# Patient Record
Sex: Female | Born: 1950 | Race: White | Hispanic: No | Marital: Single | Smoking: Former smoker
Health system: Southern US, Community
[De-identification: ages and names within clinical notes are randomized; demographics above are authoritative.]

## PROBLEM LIST (undated history)

## (undated) DIAGNOSIS — H269 Unspecified cataract: Secondary | ICD-10-CM

## (undated) DIAGNOSIS — I219 Acute myocardial infarction, unspecified: Secondary | ICD-10-CM

## (undated) DIAGNOSIS — I2510 Atherosclerotic heart disease of native coronary artery without angina pectoris: Secondary | ICD-10-CM

## (undated) DIAGNOSIS — G2581 Restless legs syndrome: Secondary | ICD-10-CM

## (undated) DIAGNOSIS — M1990 Unspecified osteoarthritis, unspecified site: Secondary | ICD-10-CM

## (undated) DIAGNOSIS — E039 Hypothyroidism, unspecified: Secondary | ICD-10-CM

## (undated) DIAGNOSIS — I10 Essential (primary) hypertension: Secondary | ICD-10-CM

## (undated) DIAGNOSIS — E079 Disorder of thyroid, unspecified: Secondary | ICD-10-CM

## (undated) DIAGNOSIS — E785 Hyperlipidemia, unspecified: Secondary | ICD-10-CM

## (undated) HISTORY — DX: Acute myocardial infarction, unspecified: I21.9

## (undated) HISTORY — DX: Atherosclerotic heart disease of native coronary artery without angina pectoris: I25.10

## (undated) HISTORY — DX: Unspecified cataract: H26.9

## (undated) HISTORY — DX: Essential (primary) hypertension: I10

## (undated) HISTORY — PX: POLYPECTOMY: SHX149

## (undated) HISTORY — DX: Hyperlipidemia, unspecified: E78.5

## (undated) HISTORY — PX: TONSILLECTOMY: SUR1361

## (undated) HISTORY — PX: CATARACT EXTRACTION: SUR2

## (undated) HISTORY — PX: LASIK: SHX215

## (undated) HISTORY — DX: Unspecified osteoarthritis, unspecified site: M19.90

## (undated) HISTORY — DX: Disorder of thyroid, unspecified: E07.9

## (undated) HISTORY — PX: COLONOSCOPY: SHX174

---

## 2006-10-09 HISTORY — PX: ABDOMINAL HYSTERECTOMY: SHX81

## 2012-10-09 HISTORY — PX: CORONARY STENT PLACEMENT: SHX1402

## 2013-06-01 DIAGNOSIS — I219 Acute myocardial infarction, unspecified: Secondary | ICD-10-CM

## 2013-06-01 HISTORY — DX: Acute myocardial infarction, unspecified: I21.9

## 2017-04-19 LAB — DERMATOPATHOLOGY REPORT

## 2018-12-30 ENCOUNTER — Ambulatory Visit: Payer: Self-pay | Admitting: Family Medicine

## 2019-02-10 ENCOUNTER — Encounter: Payer: Self-pay | Admitting: Family Medicine

## 2019-02-10 ENCOUNTER — Other Ambulatory Visit: Payer: Self-pay

## 2019-02-10 ENCOUNTER — Ambulatory Visit (INDEPENDENT_AMBULATORY_CARE_PROVIDER_SITE_OTHER): Payer: Medicare Other | Admitting: Family Medicine

## 2019-02-10 VITALS — BP 118/70 | HR 69 | Temp 98.5°F | Ht 62.5 in | Wt 113.6 lb

## 2019-02-10 DIAGNOSIS — I2510 Atherosclerotic heart disease of native coronary artery without angina pectoris: Secondary | ICD-10-CM | POA: Insufficient documentation

## 2019-02-10 DIAGNOSIS — E782 Mixed hyperlipidemia: Secondary | ICD-10-CM | POA: Insufficient documentation

## 2019-02-10 DIAGNOSIS — E039 Hypothyroidism, unspecified: Secondary | ICD-10-CM | POA: Diagnosis not present

## 2019-02-10 DIAGNOSIS — Z114 Encounter for screening for human immunodeficiency virus [HIV]: Secondary | ICD-10-CM

## 2019-02-10 DIAGNOSIS — Z1159 Encounter for screening for other viral diseases: Secondary | ICD-10-CM

## 2019-02-10 DIAGNOSIS — Z0000 Encounter for general adult medical examination without abnormal findings: Secondary | ICD-10-CM | POA: Diagnosis not present

## 2019-02-10 DIAGNOSIS — I10 Essential (primary) hypertension: Secondary | ICD-10-CM | POA: Diagnosis not present

## 2019-02-10 DIAGNOSIS — G8929 Other chronic pain: Secondary | ICD-10-CM

## 2019-02-10 DIAGNOSIS — M8580 Other specified disorders of bone density and structure, unspecified site: Secondary | ICD-10-CM | POA: Insufficient documentation

## 2019-02-10 DIAGNOSIS — Z0100 Encounter for examination of eyes and vision without abnormal findings: Secondary | ICD-10-CM

## 2019-02-10 DIAGNOSIS — G2581 Restless legs syndrome: Secondary | ICD-10-CM | POA: Insufficient documentation

## 2019-02-10 NOTE — Patient Instructions (Signed)
F/u in October... labs first (they are ordered, just make lab appointment first) then come in for appointment.   DEXA ordered. Handout given with number, but they should call you.   Will get records to see if you need any other preventative health.   So nice to meet you!!!

## 2019-02-10 NOTE — Progress Notes (Signed)
Phone: [phone]  Subjective:  Patient presents today for their Welcome to Medicare Exam and to establish care with chronic issues.   Hypertension: Here for follow up of hypertension.  Currently on cozaar 50mg  daily. Home readings range from 631 SHFWYOVZ/85 diastolic (with a wrist cuff in the AM). Takes medication as prescribed and denies any side effects. Exercise includes walking. Weight has been stable. Denies any chest pain, headaches, shortness of breath, vision changes, swelling in lower extremities.   Hypothyroid: currently on synthroid. She had her labs checked in 07/2018 and everything was normal. Currently asymptomatic.   CAD: stent placed in 2015. She is on her statin and was cleared by cardiologist 2 years ago. Takes baby asa daily.   Hyperlipidemia: currently on crestor. Takes daily. Hx of CAD with stent. Remote hx of smoking. No hx of diabetes. Takes medication as prescribed with no issues. Due for labs in October.   Mmg: 07/2018: normal Hysterectomy: no longer needs pap smears.  Cscope: 15 years ago..  Bone scan: due for this.  Pneumonia shot: unsure.   Preventive Screening-Counseling & Management  Vision screen: done today. R: 20/20. L: 20/100. Both: 20/20.   Visual Acuity Screening   Right eye Left eye Both eyes  Without correction: 20/20 20/100 20/20  With correction:       Advanced directives: yes   Smoking Status: Never Smoker Second Hand Smoking status: No smokers in home  Risk Factors Regular exercise: walks 5 miles per day  Diet: Vegetarian   Fall Risk: None  Fall Risk  02/10/2019  Falls in the past year? 0  Number falls in past yr: 0  Injury with Fall? 0   Opioid use history:  long term opioids use (Tramadol)  Cardiac risk factors:  advanced age (older than 30 for men, 48 for women)  Hyperlipidemia yes  No diabetes. no Family History: father,mother high cholesterol, MGM + diabetes   Depression Screen None. PHQ2 0 0   Depression screen PHQ  2/9 02/10/2019  Decreased Interest 0  Down, Depressed, Hopeless 0  PHQ - 2 Score 0  Altered sleeping 2  Tired, decreased energy 0  Change in appetite 0  Feeling bad or failure about yourself  0  Trouble concentrating 0  Moving slowly or fidgety/restless 0  Suicidal thoughts 0  PHQ-9 Score 2  Difficult doing work/chores Not difficult at all      Office Visit from 02/10/2019 in [facility]  PHQ-9 Total Score  2      Activities of Daily Living Independent ADLs and IADLs   Hearing Difficulties: -patient declines  Cognitive Testing No reported trouble.  none  Normal 3 word recall  Minicog: 5/5  List the Names of Other Physician/Practitioners you currently use: -no specialists at this time.    There is no immunization history on file for this patient. Required Immunizations needed today-will wait for records.   Screening tests- up to date Health Maintenance Due  Topic Date Due  . Hepatitis C Screening  1951/06/30  . HIV Screening  07/29/1968  . TETANUS/TDAP  07/29/1972  . MAMMOGRAM  07/30/2003  . COLONOSCOPY  07/30/2003  . DEXA SCAN  07/29/2018  . PNA vac Low Risk Adult (1 of 2 - PCV13) 07/29/2018   Review of Systems  Constitutional: Negative for chills, fever and malaise/fatigue.  HENT: Negative for hearing loss and sore throat.   Eyes: Negative for blurred vision and double vision.  Respiratory: Negative for cough, shortness of breath and wheezing.  Cardiovascular: Negative for chest pain, palpitations and leg swelling.  Gastrointestinal: Negative for abdominal pain, blood in stool, nausea and vomiting.  Genitourinary: Negative for dysuria and hematuria.  Musculoskeletal: Negative for falls.  Skin: Negative for rash.  Neurological: Negative for dizziness and weakness.  Psychiatric/Behavioral: Negative for memory loss and suicidal ideas. The patient is not nervous/anxious and does not have insomnia.      The following were  reviewed and entered/updated in epic: Past Medical History:  Diagnosis Date  . Hyperlipidemia   . Hypertension   . Thyroid disease    Patient Active Problem List   Diagnosis Date Noted  . CAD (coronary artery disease) 02/10/2019  . HTN (hypertension) 02/10/2019  . Hypothyroidism (acquired) 02/10/2019  . Hyperlipidemia, mixed 02/10/2019  . Osteopenia 02/10/2019  . RLS (restless legs syndrome) 02/10/2019  . Chronic pain 02/10/2019   Past Surgical History:  Procedure Laterality Date  . ABDOMINAL HYSTERECTOMY  2008  . CORONARY STENT PLACEMENT  2014    History reviewed. No pertinent family history.  Medications- reviewed and updated Current Outpatient Medications  Medication Sig Dispense Refill  . gabapentin (NEURONTIN) 300 MG capsule Take 300 mg by mouth 4 (four) times daily.    [name] levothyroxine (SYNTHROID) 88 MCG tablet Take 88 mcg by mouth daily before breakfast.    . losartan (COZAAR) 50 MG tablet Take 50 mg by mouth daily.    . rosuvastatin (CRESTOR) 40 MG tablet Take 40 mg by mouth daily.    . traMADol (ULTRAM) 50 MG tablet 100 mg 4 (four) times daily.      No current facility-administered medications for this visit.     Allergies-reviewed and updated Not on File  Social History   Socioeconomic History  . Marital status: Not on file    Spouse name: Not on file  . Number of children: Not on file  . Years of education: Not on file  . Highest education level: Not on file  Occupational History  . Not on file  Social Needs  . Financial resource strain: Not on file  . Food insecurity:    Worry: Not on file    Inability: Not on file  . Transportation needs:    Medical: Not on file    Non-medical: Not on file  Tobacco Use  . Smoking status: Former Research scientist (life sciences)  . Smokeless tobacco: Never Used  Substance and Sexual Activity  . Alcohol use: Not on file  . Drug use: Never  . Sexual activity: Not on file  Lifestyle  . Physical activity:    Days per week: Not on file     Minutes per session: Not on file  . Stress: Not on file  Relationships  . Social connections:    Talks on phone: Not on file    Gets together: Not on file    Attends religious service: Not on file    Active member of club or organization: Not on file    Attends meetings of clubs or organizations: Not on file    Relationship status: Not on file  Other Topics Concern  . Not on file  Social History Narrative  . Not on file    Objective: BP 118/70 (BP Location: Left Arm, Patient Position: Sitting)   Pulse 69   Temp 98.5 F (36.9 C) (Oral)   Ht 5' 2.5" (1.588 m)   Wt 113 lb 9.6 oz (51.5 kg)   LMP  (LMP Unknown)   SpO2 96%   BMI 20.45 kg/m  Gen: NAD, resting comfortably HEENT: Mucous membranes are moist. Oropharynx normal Neck: no thyromegaly CV: RRR no murmurs rubs or gallops Lungs: CTAB no crackles, wheeze, rhonchi Abdomen: soft/nontender/nondistended/normal bowel sounds. No rebound or guarding.  Ext: no edema Skin: warm, dry Neuro: grossly normal, moves all extremities, PERRLA  Assessment/Plan:  Welcome to Medicare exam completed- discussed recommended screenings anddocumented any personalized health advice and referrals for preventive counseling. See AVS as well which was given to patient. Depression screen done and her phq9 score is non significant at 2. No signs or symptoms of depression. Requesting records for all of her HM and other medical records.   Status of chronic or acute concerns  1. Essential hypertension Blood pressure is to goal. Continue current anti-hypertensive medications. Refills not needed. Routine lab work will be done today. Recommended routine exercise and healthy diet including DASH diet and mediterranean diet. Encouraged weight loss. F/u in 6 months.   - CBC with Differential/Platelet; Future - Comprehensive metabolic panel; Future - Microalbumin / creatinine urine ratio; Future  2. Hypothyroidism (acquired) - T4, free; Future - TSH;  Future  3. Coronary artery disease involving native coronary artery of native heart without angina pectoris -continue statin and baby aspirin. Already has healthy and active lifestyle. Continue current treatment plan.   4. Hyperlipidemia, mixed  - Lipid panel; Future  5. Osteopenia, unspecified location  - DG Bone Density; Future - VITAMIN D 25 Hydroxy (Vit-D Deficiency, Fractures); Future  6. RLS -will check ferritin level when she comes in. Does not need medication at this time.   7. Other chronic pain Drug contract signed today. Requesting records.   8. Encounter for screening for HIV  - HIV Antibody (routine testing w rflx); Future  9. Encounter for hepatitis C screening test for low risk patient  - Hepatitis C antibody; Future   No future appointments. Return in about 5 months (around 07/10/2019) for routine f/u with labs. .   Lab/Order associations: Encounter for screening for HIV - Plan: HIV Antibody (routine testing w rflx)  Essential hypertension - Plan: CBC with Differential/Platelet, Comprehensive metabolic panel, Microalbumin / creatinine urine ratio  Hypothyroidism (acquired) - Plan: T4, free, TSH  Coronary artery disease involving native coronary artery of native heart without angina pectoris  Hyperlipidemia, mixed - Plan: Lipid panel  Osteopenia, unspecified location - Plan: DG Bone Density, VITAMIN D 25 Hydroxy (Vit-D Deficiency, Fractures)  Other chronic pain  Encounter for hepatitis C screening test for low risk patient - Plan: Hepatitis C antibody  Encounter for vision screening  RLS (restless legs syndrome)  No orders of the defined types were placed in this encounter.   Return precautions advised. Orma Flaming, MD

## 2019-02-17 ENCOUNTER — Other Ambulatory Visit: Payer: Self-pay | Admitting: Family Medicine

## 2019-02-17 DIAGNOSIS — M8580 Other specified disorders of bone density and structure, unspecified site: Secondary | ICD-10-CM

## 2019-03-09 ENCOUNTER — Encounter: Payer: Self-pay | Admitting: Family Medicine

## 2019-03-10 ENCOUNTER — Other Ambulatory Visit: Payer: Self-pay

## 2019-03-10 MED ORDER — GABAPENTIN 300 MG PO CAPS: 300.0000 mg | ORAL_CAPSULE | Freq: Four times a day (QID) | ORAL | 3 refills | Status: DC

## 2019-03-26 DIAGNOSIS — M79671 Pain in right foot: Secondary | ICD-10-CM | POA: Diagnosis not present

## 2019-03-26 DIAGNOSIS — M79672 Pain in left foot: Secondary | ICD-10-CM | POA: Diagnosis not present

## 2019-04-02 ENCOUNTER — Encounter: Payer: Self-pay | Admitting: Family Medicine

## 2019-04-02 ENCOUNTER — Other Ambulatory Visit: Payer: Self-pay

## 2019-04-02 MED ORDER — TRAMADOL HCL 50 MG PO TABS: 100.0000 mg | ORAL_TABLET | Freq: Four times a day (QID) | ORAL | 0 refills | Status: DC

## 2019-04-02 MED ORDER — ROSUVASTATIN CALCIUM 40 MG PO TABS: 40.0000 mg | ORAL_TABLET | Freq: Every day | ORAL | 3 refills | Status: DC

## 2019-04-02 MED ORDER — LOSARTAN POTASSIUM 50 MG PO TABS: 50.0000 mg | ORAL_TABLET | Freq: Every day | ORAL | 3 refills | Status: DC

## 2019-04-02 MED ORDER — LEVOTHYROXINE SODIUM 88 MCG PO TABS: 88.0000 ug | ORAL_TABLET | Freq: Every day | ORAL | 3 refills | Status: DC

## 2019-04-02 NOTE — Progress Notes (Signed)
Refills for:  levothyroxine (SYNTHROID) 88 MCG tablet 88 mcg, Daily before breakfast            losartan (COZAAR) 50 MG tablet 50 mg, Daily           rosuvastatin (CRESTOR) 40 MG tablet 40 mg, Daily      Sent to [facility].  Tramadol refill request sent to Dr. Jonni Sanger for approval.

## 2019-04-21 DIAGNOSIS — Z1159 Encounter for screening for other viral diseases: Secondary | ICD-10-CM | POA: Diagnosis not present

## 2019-04-21 DIAGNOSIS — Z01812 Encounter for preprocedural laboratory examination: Secondary | ICD-10-CM | POA: Diagnosis not present

## 2019-04-22 ENCOUNTER — Encounter: Payer: Self-pay | Admitting: Family Medicine

## 2019-04-22 NOTE — Telephone Encounter (Signed)
Copied from [place] [id number]. Topic: General - Other >> Apr 22, 2019  2:48 PM [name] wrote: Reason for CRM: Pt stated she has an appt for surgery on 04/25/19 and the surgeons office (Dr. Jetta Lout) has been trying to fax the medical clearance form to the office but they have been unsuccessful. Pt asked that Dr. Kateri Plummer office be contacted regarding the medical clearance form. Fax# [phone] attnLangley Gauss ph# [phone]

## 2019-04-25 DIAGNOSIS — E7800 Pure hypercholesterolemia, unspecified: Secondary | ICD-10-CM | POA: Diagnosis not present

## 2019-04-25 DIAGNOSIS — Z87891 Personal history of nicotine dependence: Secondary | ICD-10-CM | POA: Diagnosis not present

## 2019-04-25 DIAGNOSIS — Z8261 Family history of arthritis: Secondary | ICD-10-CM | POA: Diagnosis not present

## 2019-04-25 DIAGNOSIS — I119 Hypertensive heart disease without heart failure: Secondary | ICD-10-CM | POA: Diagnosis not present

## 2019-04-25 DIAGNOSIS — M24477 Recurrent dislocation, right toe(s): Secondary | ICD-10-CM | POA: Diagnosis not present

## 2019-04-25 DIAGNOSIS — M810 Age-related osteoporosis without current pathological fracture: Secondary | ICD-10-CM | POA: Diagnosis not present

## 2019-04-25 DIAGNOSIS — Z79899 Other long term (current) drug therapy: Secondary | ICD-10-CM | POA: Diagnosis not present

## 2019-04-25 DIAGNOSIS — E079 Disorder of thyroid, unspecified: Secondary | ICD-10-CM | POA: Diagnosis not present

## 2019-04-25 DIAGNOSIS — M24375 Pathological dislocation of left foot, not elsewhere classified: Secondary | ICD-10-CM | POA: Diagnosis not present

## 2019-04-25 DIAGNOSIS — S93104A Unspecified dislocation of right toe(s), initial encounter: Secondary | ICD-10-CM | POA: Diagnosis not present

## 2019-04-25 DIAGNOSIS — M24374 Pathological dislocation of right foot, not elsewhere classified: Secondary | ICD-10-CM | POA: Diagnosis not present

## 2019-05-01 ENCOUNTER — Other Ambulatory Visit: Payer: Self-pay | Admitting: Family Medicine

## 2019-05-01 MED ORDER — TRAMADOL HCL 50 MG PO TABS: 100.0000 mg | ORAL_TABLET | Freq: Four times a day (QID) | ORAL | 0 refills | Status: DC

## 2019-05-01 NOTE — Telephone Encounter (Signed)
Last OV 02/10/19 Last refill 04/02/19 #240/0 Next OV not scheudled

## 2019-05-02 DIAGNOSIS — M79672 Pain in left foot: Secondary | ICD-10-CM | POA: Diagnosis not present

## 2019-05-02 DIAGNOSIS — M79671 Pain in right foot: Secondary | ICD-10-CM | POA: Diagnosis not present

## 2019-05-14 DIAGNOSIS — M79671 Pain in right foot: Secondary | ICD-10-CM | POA: Diagnosis not present

## 2019-05-15 ENCOUNTER — Other Ambulatory Visit: Payer: Medicare Other

## 2019-05-28 DIAGNOSIS — M79671 Pain in right foot: Secondary | ICD-10-CM | POA: Diagnosis not present

## 2019-06-02 ENCOUNTER — Other Ambulatory Visit: Payer: Self-pay | Admitting: Family Medicine

## 2019-06-02 ENCOUNTER — Encounter: Payer: Self-pay | Admitting: Family Medicine

## 2019-06-04 ENCOUNTER — Other Ambulatory Visit: Payer: Self-pay | Admitting: Family Medicine

## 2019-06-04 MED ORDER — TRAMADOL HCL 50 MG PO TABS: 100.0000 mg | ORAL_TABLET | Freq: Four times a day (QID) | ORAL | 0 refills | Status: DC

## 2019-06-04 NOTE — Progress Notes (Signed)
pmp reveiwed. Oxycodone for toe surgery as well as norco. Taking correctly. pmp verified.  Orma Flaming, MD [place]

## 2019-06-05 ENCOUNTER — Encounter: Payer: Self-pay | Admitting: Family Medicine

## 2019-06-23 ENCOUNTER — Ambulatory Visit (INDEPENDENT_AMBULATORY_CARE_PROVIDER_SITE_OTHER): Payer: Medicare Other | Admitting: Family Medicine

## 2019-06-23 ENCOUNTER — Encounter: Payer: Self-pay | Admitting: Family Medicine

## 2019-06-23 ENCOUNTER — Other Ambulatory Visit: Payer: Self-pay

## 2019-06-23 VITALS — BP 118/72 | HR 80 | Ht 62.4 in | Wt 112.0 lb

## 2019-06-23 DIAGNOSIS — G8929 Other chronic pain: Secondary | ICD-10-CM | POA: Diagnosis not present

## 2019-06-23 DIAGNOSIS — M25541 Pain in joints of right hand: Secondary | ICD-10-CM

## 2019-06-23 DIAGNOSIS — E039 Hypothyroidism, unspecified: Secondary | ICD-10-CM

## 2019-06-23 DIAGNOSIS — E782 Mixed hyperlipidemia: Secondary | ICD-10-CM

## 2019-06-23 DIAGNOSIS — I2510 Atherosclerotic heart disease of native coronary artery without angina pectoris: Secondary | ICD-10-CM

## 2019-06-23 DIAGNOSIS — Z0000 Encounter for general adult medical examination without abnormal findings: Secondary | ICD-10-CM

## 2019-06-23 DIAGNOSIS — I10 Essential (primary) hypertension: Secondary | ICD-10-CM

## 2019-06-23 DIAGNOSIS — M25542 Pain in joints of left hand: Secondary | ICD-10-CM

## 2019-06-23 MED ORDER — PNEUMOCOCCAL VAC POLYVALENT 25 MCG/0.5ML IJ INJ: 0.5000 mL | INJECTION | INTRAMUSCULAR | 0 refills | Status: AC

## 2019-06-23 NOTE — Patient Instructions (Signed)
I would like to see you back at the end of October after you have done your blood work so we can discuss everything in person.  On your way out please make an appointment for both the lab work and your next appointment with me.    Have a great day,   Clemetine Marker, MD

## 2019-06-23 NOTE — Progress Notes (Signed)
[facility] Phone: [phone]     Crystal Barnes - 68 y.o. female MRN [id number]  Date of birth: Nov 13, 1950  Subjective:   cc: establish care  HPI:  Patient moved to [place] within the past year from [place], [place]. She originally established with [facility] but had to re-establish with [facility] d/t insurance reasons after she moved from her apartment to her home.    Aching hands: patient has had aching feeling in the joints of her hands since doing some work fixing up her new house a few months ago..  It is worse in the morning and resolves by mid-day.  She admits she was doing more work around the house recently which could be causing her hands to be sore.      [id number] years ago she had two episodes of ACS which caused her to go to the ED where her BP was  200/100.  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 She has maintained a Vegan since that time. She takes crestor and has no side effects such as RUQ or muscle pain.   Chronic pain in right pelvis - this has been a chronic issue for her and she has tried several treatment modalities including nerve blocks, elavil.  She is currently on gabapentin 4x daily and tramadol 4x daily.  She says this controls her pain very well but she notices the pain immediately if she does not take her medication for a day.     Hypothyroidism - the patient is currently on 73mcg daily of synthroid.  Tolerating it well.  Denies weight gain/loss, palpitations.   The patient would like to have all her lab work done in October.  She also wants to get a colonoscopy and mammogram during October too.    Social: No children, never married, drinks alcohol rarely. Quit smoking in  1994, smoked for 30 years before that. Retired now. 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 For fun she will  Watch tv and play with labradoodle..she also Visits with sister, who lives nearby. .     ROS:  See HPI for pertinent positives and negatives  Family history reviewed for today's visit. No changes.  Social history- patient is a former smoker   Objective:   BP 118/72   Pulse 80   Ht 5' 2.4" (1.585 m)   Wt 112 lb (50.8 kg)   LMP  (LMP Unknown)   SpO2 96%   BMI 20.22 kg/m  Gen: NAD, alert and oriented, cooperative with exam HEENT: NCAT, EOMI, MMM Neck: FROM, supple, no masses CV: normal rate, regular rhythm. No murmurs, no rubs.  Resp: LCTAB, no wheezes, crackles. normal work of breathing GI: nontender to palpation, BS present, no guarding or organomegaly Msk: No edema, warm, normal tone, moves UE/LE spontaneously. No TTP or swelling of joints of hands/wrists bilaterally. Neuro: CN II-XII grossly intact. no gross deficits Skin: No rashes, no lesions Psych: Appropriate behavior  Assessment/Plan:   Arthralgia of both hands Started noticing it after she did some extensive housework when moving into her new house.  Given that it is worse in the morning and improves throughout the day, it raises the possibility of rheumatoid arthritis. Patient does have hypothyroidism, and it is uncertain if that was caused by autoimmune disorder.  However, given her age and lack of other symptoms and normal appearance on exam, it is unlikely this is RA.  Given she noticed it after a period of extensive use, more likely it is the beginning of arthritis, but lacks physical changes to the joints at this time.    Chronic pain Well controlled on tramadol and gabapentin. Does not need refill at this time.   Health maintenance examination - colonoscopy referral - mammogram referral - labs in October: hiv, hep c, bmp, cbc  Hyperlipidemia, mixed On crestor.  No side effects - lipid panel in october  Hypothyroidism (acquired) Currently taking synthroid 60mcg - tsh in october  CAD (coronary artery disease) No chest pain currently. Had stent placed in 2015.  On ASA 81. On crestor and losartan.    HTN (hypertension) On losartan 50mg  daily.  Well controlled.  Normotensive today.  - continue losartan  Clemetine Marker, MD PGY-2 [facility]

## 2019-06-24 ENCOUNTER — Telehealth: Payer: Self-pay | Admitting: *Deleted

## 2019-06-24 NOTE — Telephone Encounter (Signed)
Recived fax from [facility].  Pneumo 23 cant be sent thru home delivery pharmacy, will need to be sent to local pharmacy or given here in office. Christen Bame, CMA

## 2019-06-25 ENCOUNTER — Other Ambulatory Visit: Payer: Self-pay | Admitting: Family Medicine

## 2019-06-25 ENCOUNTER — Encounter: Payer: Self-pay | Admitting: Gastroenterology

## 2019-06-25 MED ORDER — PNEUMOVAX 23 25 MCG/0.5ML IJ INJ: 0.5000 mL | INJECTION | INTRAMUSCULAR | 0 refills | Status: AC

## 2019-06-25 NOTE — Telephone Encounter (Signed)
We are out of stock at the moment.  To white team to call when new shipment arrives.  Christen Bame, CMA

## 2019-06-25 NOTE — Telephone Encounter (Signed)
I called the patient to follow up that we heard back from our [facility] rep who stated that Dr. Rogers Blocker is credentialed with [facility] Medicare, effective 02/03/18.  The patient wanted to have Dr. Rogers Blocker as PCP, she had just saw Dr. Jeannine Kitten for a meet and greet on 06/23/19 (not [place]) thinking she had to change PCPs due to credentialing and said that she would contact their office to advise that she is back with Dr. Rogers Blocker. No approval was needed as this was a credentialing issue and not [place] to [employer].  Patient said that she is happy to get this update and is excited to be back with Dr. Rogers Blocker.

## 2019-06-25 NOTE — Telephone Encounter (Signed)
We have it here right?  I don't think I realized that at the time.  She can just come here to get it if we have it.  i'll put it in as a future order and let her know.

## 2019-06-26 DIAGNOSIS — M25542 Pain in joints of left hand: Secondary | ICD-10-CM | POA: Insufficient documentation

## 2019-06-26 DIAGNOSIS — M25541 Pain in joints of right hand: Secondary | ICD-10-CM | POA: Insufficient documentation

## 2019-06-26 DIAGNOSIS — Z0000 Encounter for general adult medical examination without abnormal findings: Secondary | ICD-10-CM | POA: Insufficient documentation

## 2019-06-26 NOTE — Assessment & Plan Note (Signed)
On crestor.  No side effects - lipid panel in october

## 2019-06-26 NOTE — Assessment & Plan Note (Signed)
Currently taking synthroid 47mcg - tsh in october

## 2019-06-26 NOTE — Assessment & Plan Note (Signed)
-   colonoscopy referral - mammogram referral - labs in October: hiv, hep c, bmp, cbc

## 2019-06-26 NOTE — Assessment & Plan Note (Signed)
Started noticing it after she did some extensive housework when moving into her new house.  Given that it is worse in the morning and improves throughout the day, it raises the possibility of rheumatoid arthritis. Patient does have hypothyroidism, and it is uncertain if that was caused by autoimmune disorder.  However, given her age and lack of other symptoms and normal appearance on exam, it is unlikely this is RA.  Given she noticed it after a period of extensive use, more likely it is the beginning of arthritis, but lacks physical changes to the joints at this time.

## 2019-06-26 NOTE — Assessment & Plan Note (Signed)
No chest pain currently. Had stent placed in 2015.  On ASA 81. On crestor and losartan.

## 2019-06-26 NOTE — Assessment & Plan Note (Signed)
Well controlled on tramadol and gabapentin. Does not need refill at this time.

## 2019-06-26 NOTE — Assessment & Plan Note (Signed)
On losartan 50mg  daily.  Well controlled.  Normotensive today.  - continue losartan

## 2019-06-27 ENCOUNTER — Encounter: Payer: Self-pay | Admitting: Family Medicine

## 2019-06-30 ENCOUNTER — Other Ambulatory Visit: Payer: Self-pay | Admitting: Family Medicine

## 2019-07-03 ENCOUNTER — Encounter: Payer: Self-pay | Admitting: Family Medicine

## 2019-07-07 ENCOUNTER — Encounter: Payer: Self-pay | Admitting: Gastroenterology

## 2019-07-07 ENCOUNTER — Ambulatory Visit (AMBULATORY_SURGERY_CENTER): Payer: Self-pay

## 2019-07-07 ENCOUNTER — Other Ambulatory Visit: Payer: Self-pay

## 2019-07-07 VITALS — Temp 96.6°F | Ht 62.5 in | Wt 112.6 lb

## 2019-07-07 DIAGNOSIS — Z1211 Encounter for screening for malignant neoplasm of colon: Secondary | ICD-10-CM

## 2019-07-07 MED ORDER — PEG 3350-KCL-NA BICARB-NACL 420 G PO SOLR: 4000.0000 mL | Freq: Once | ORAL | 0 refills | Status: AC

## 2019-07-07 NOTE — Progress Notes (Signed)
Denies allergies to eggs or soy products. Denies complication of anesthesia or sedation. Denies use of weight loss medication. Denies use of O2.   Emmi instructions given for colonoscopy.  

## 2019-07-14 ENCOUNTER — Other Ambulatory Visit: Payer: Self-pay

## 2019-07-14 ENCOUNTER — Other Ambulatory Visit: Payer: Medicare Other

## 2019-07-14 ENCOUNTER — Other Ambulatory Visit (INDEPENDENT_AMBULATORY_CARE_PROVIDER_SITE_OTHER): Payer: Medicare Other

## 2019-07-14 DIAGNOSIS — E782 Mixed hyperlipidemia: Secondary | ICD-10-CM

## 2019-07-14 DIAGNOSIS — M8580 Other specified disorders of bone density and structure, unspecified site: Secondary | ICD-10-CM

## 2019-07-14 DIAGNOSIS — G2581 Restless legs syndrome: Secondary | ICD-10-CM

## 2019-07-14 DIAGNOSIS — I10 Essential (primary) hypertension: Secondary | ICD-10-CM

## 2019-07-14 DIAGNOSIS — E039 Hypothyroidism, unspecified: Secondary | ICD-10-CM | POA: Diagnosis not present

## 2019-07-14 DIAGNOSIS — Z1159 Encounter for screening for other viral diseases: Secondary | ICD-10-CM

## 2019-07-14 DIAGNOSIS — Z114 Encounter for screening for human immunodeficiency virus [HIV]: Secondary | ICD-10-CM

## 2019-07-14 LAB — FERRITIN: Ferritin: 92 ng/mL (ref 10.0–291.0)

## 2019-07-14 LAB — COMPREHENSIVE METABOLIC PANEL
ALT: 42 U/L — ABNORMAL HIGH (ref 0–35)
AST: 37 U/L (ref 0–37)
Albumin: 4.3 g/dL (ref 3.5–5.2)
Alkaline Phosphatase: 69 U/L (ref 39–117)
BUN: 17 mg/dL (ref 6–23)
CO2: 28 mEq/L (ref 19–32)
Calcium: 9.8 mg/dL (ref 8.4–10.5)
Chloride: 104 mEq/L (ref 96–112)
Creatinine, Ser: 0.62 mg/dL (ref 0.40–1.20)
GFR: 96.32 mL/min (ref >60.00)
Glucose, Bld: 91 mg/dL (ref 70–99)
Potassium: 5.2 mEq/L — ABNORMAL HIGH (ref 3.5–5.1)
Sodium: 137 mEq/L (ref 135–145)
Total Bilirubin: 0.4 mg/dL (ref 0.2–1.2)
Total Protein: 6.3 g/dL (ref 6.0–8.3)

## 2019-07-14 LAB — CBC WITH DIFFERENTIAL/PLATELET
Basophils Absolute: 0.1 10*3/uL (ref 0.0–0.1)
Basophils Relative: 1.3 % (ref 0.0–3.0)
Eosinophils Absolute: 0.4 10*3/uL (ref 0.0–0.7)
Eosinophils Relative: 7.4 % — ABNORMAL HIGH (ref 0.0–5.0)
HCT: 40.7 % (ref 36.0–46.0)
Hemoglobin: 13.5 g/dL (ref 12.0–15.0)
Lymphocytes Relative: 37.1 % (ref 12.0–46.0)
Lymphs Abs: 2.1 10*3/uL (ref 0.7–4.0)
MCHC: 33.3 g/dL (ref 30.0–36.0)
MCV: 94.5 fl (ref 78.0–100.0)
Monocytes Absolute: 0.5 10*3/uL (ref 0.1–1.0)
Monocytes Relative: 8.7 % (ref 3.0–12.0)
Neutro Abs: 2.6 10*3/uL (ref 1.4–7.7)
Neutrophils Relative %: 45.5 % (ref 43.0–77.0)
Platelets: 288 10*3/uL (ref 150.0–400.0)
RBC: 4.31 Mil/uL (ref 3.87–5.11)
RDW: 13.4 % (ref 11.5–15.5)
WBC: 5.6 10*3/uL (ref 4.0–10.5)

## 2019-07-14 LAB — TSH: TSH: 5.17 u[IU]/mL — ABNORMAL HIGH (ref 0.35–4.50)

## 2019-07-14 LAB — LIPID PANEL
Cholesterol: 159 mg/dL (ref 0–200)
HDL: 52.3 mg/dL (ref >39.00)
LDL Cholesterol: 86 mg/dL (ref 0–99)
NonHDL: 106.8
Total CHOL/HDL Ratio: 3
Triglycerides: 103 mg/dL (ref 0.0–149.0)
VLDL: 20.6 mg/dL (ref 0.0–40.0)

## 2019-07-14 LAB — MICROALBUMIN / CREATININE URINE RATIO
Creatinine,U: 12.1 mg/dL
Microalb Creat Ratio: 5.8 mg/g (ref 0.0–30.0)
Microalb, Ur: <0.7 mg/dL (ref 0.0–1.9)

## 2019-07-14 LAB — VITAMIN D 25 HYDROXY (VIT D DEFICIENCY, FRACTURES): VITD: 59.94 ng/mL (ref 30.00–100.00)

## 2019-07-14 LAB — T4, FREE: Free T4: 0.69 ng/dL (ref 0.60–1.60)

## 2019-07-15 ENCOUNTER — Encounter: Payer: Self-pay | Admitting: Family Medicine

## 2019-07-15 LAB — HIV ANTIBODY (ROUTINE TESTING W REFLEX): HIV 1&2 Ab, 4th Generation: NONREACTIVE

## 2019-07-15 LAB — HEPATITIS C ANTIBODY
Hepatitis C Ab: NONREACTIVE
SIGNAL TO CUT-OFF: 0.18 (ref <1.00)

## 2019-07-17 ENCOUNTER — Other Ambulatory Visit: Payer: Medicare Other

## 2019-07-22 ENCOUNTER — Encounter: Payer: Medicare Other | Admitting: Gastroenterology

## 2019-07-24 ENCOUNTER — Ambulatory Visit
Admission: RE | Admit: 2019-07-24 | Discharge: 2019-07-24 | Disposition: A | Discharge status: Home / Self Care | Payer: Medicare Other | Source: Ambulatory Visit | Attending: Family Medicine | Admitting: Family Medicine

## 2019-07-24 ENCOUNTER — Other Ambulatory Visit: Payer: Self-pay

## 2019-07-24 DIAGNOSIS — M8580 Other specified disorders of bone density and structure, unspecified site: Secondary | ICD-10-CM

## 2019-07-25 ENCOUNTER — Ambulatory Visit: Payer: Medicare Other | Admitting: Family Medicine

## 2019-07-26 ENCOUNTER — Other Ambulatory Visit: Payer: Self-pay | Admitting: Family Medicine

## 2019-07-28 ENCOUNTER — Encounter: Payer: Self-pay | Admitting: Family Medicine

## 2019-07-28 ENCOUNTER — Ambulatory Visit (INDEPENDENT_AMBULATORY_CARE_PROVIDER_SITE_OTHER): Payer: Medicare Other | Admitting: Family Medicine

## 2019-07-28 ENCOUNTER — Other Ambulatory Visit: Payer: Self-pay

## 2019-07-28 ENCOUNTER — Encounter: Payer: Medicare Other | Admitting: Family Medicine

## 2019-07-28 VITALS — BP 118/72 | HR 78 | Temp 97.4°F | Ht 62.5 in | Wt 109.4 lb

## 2019-07-28 DIAGNOSIS — E039 Hypothyroidism, unspecified: Secondary | ICD-10-CM

## 2019-07-28 DIAGNOSIS — E782 Mixed hyperlipidemia: Secondary | ICD-10-CM | POA: Diagnosis not present

## 2019-07-28 DIAGNOSIS — I10 Essential (primary) hypertension: Secondary | ICD-10-CM

## 2019-07-28 DIAGNOSIS — M8580 Other specified disorders of bone density and structure, unspecified site: Secondary | ICD-10-CM

## 2019-07-28 NOTE — Progress Notes (Signed)
Patient: Crystal Barnes MRN: [id number] DOB: [birth year] PCP: Orma Flaming, MD     Subjective:  Chief Complaint  Patient presents with  . osteopenia    HPI: The patient is a 68 y.o. female who presents today for follow up of labs. She was seen in September by a resident for her annual exam. She is here to follow up on labs/bone density. She is due for her cscope and mmg which she has coming up in the next month. All labs/dexa reviewed.   Hypertension: Here for follow up of hypertension.  Currently on cozaar 50mg /day. Takes medication as prescribed and denies any side effects. Exercise includes walking. Weight has been stable. Denies any chest pain, headaches, shortness of breath, vision changes, swelling in lower extremities.   Hypothyroid: TSH was just slightly above range. She states she takes with her coffee in the am. No sympotms.   Hyperlipidemia: well controlled on her crestor. Lipid panel to goal.   Osteopenia: calcium/vit.d and walking. reviewing her bone scan from this month.   Nov 10: colonoscopy 07/2019: mammogram  Review of Systems  Constitutional: Negative for chills, fatigue and fever.  HENT: Negative for congestion, dental problem, ear pain, hearing loss, postnasal drip, rhinorrhea, sore throat and trouble swallowing.   Eyes: Negative for visual disturbance.  Respiratory: Negative for cough, chest tightness and shortness of breath.   Cardiovascular: Negative for chest pain, palpitations and leg swelling.  Gastrointestinal: Negative for abdominal pain, blood in stool, diarrhea, nausea and vomiting.  Endocrine: Negative for cold intolerance, polydipsia, polyphagia and polyuria.  Genitourinary: Negative for dysuria and hematuria.  Musculoskeletal: Negative for arthralgias.  Skin: Negative for rash.  Neurological: Negative for dizziness and headaches.  Psychiatric/Behavioral: Negative for dysphoric mood and sleep disturbance. The patient is not nervous/anxious.      Allergies Patient has No Known Allergies.  Past Medical History Patient  has a past medical history of Arthritis, CAD (coronary artery disease), Cataract, Hyperlipidemia, Hypertension, Myocardial infarction [facility]), and Thyroid disease.  Surgical History Patient  has a past surgical history that includes Abdominal hysterectomy (2008); Coronary stent placement (2014); Cataract extraction (Bilateral); and Tonsillectomy.  Family History Pateint's family history includes Esophageal cancer in her father.  Social History Patient  reports that she has quit smoking. She has never used smokeless tobacco. She reports previous alcohol use. She reports that she does not use drugs.    Objective: Vitals:   07/28/19 1343  BP: 118/72  Pulse: 78  Temp: (!) 97.4 F (36.3 C)  TempSrc: Skin  SpO2: 99%  Weight: 109 lb 6.4 oz (49.6 kg)  Height: 5' 2.5" (1.588 m)    Body mass index is 19.69 kg/m.  Physical Exam Vitals signs reviewed.  Constitutional:      Appearance: Normal appearance. She is well-developed and normal weight.  HENT:     Head: Normocephalic and atraumatic.     Right Ear: Tympanic membrane, ear canal and external ear normal.     Left Ear: Tympanic membrane, ear canal and external ear normal.     Nose: Nose normal.     Mouth/Throat:     Mouth: Mucous membranes are moist.  Eyes:     Extraocular Movements: Extraocular movements intact.     Conjunctiva/sclera: Conjunctivae normal.     Pupils: Pupils are equal, round, and reactive to light.  Neck:     Musculoskeletal: Normal range of motion and neck supple.     Thyroid: No thyromegaly.  Cardiovascular:     Rate and  Rhythm: Normal rate and regular rhythm.     Pulses: Normal pulses.     Heart sounds: Normal heart sounds. No murmur.  Pulmonary:     Effort: Pulmonary effort is normal.     Breath sounds: Normal breath sounds.  Abdominal:     General: Abdomen is flat. Bowel sounds are normal. There is no distension.      Palpations: Abdomen is soft.     Tenderness: There is no abdominal tenderness.  Lymphadenopathy:     Cervical: No cervical adenopathy.  Skin:    General: Skin is warm and dry.     Capillary Refill: Capillary refill takes less than 2 seconds.     Findings: No rash.  Neurological:     General: No focal deficit present.     Mental Status: She is alert and oriented to person, place, and time.     Cranial Nerves: No cranial nerve deficit.     Coordination: Coordination normal.     Deep Tendon Reflexes: Reflexes normal.  Psychiatric:        Mood and Affect: Mood normal.        Behavior: Behavior normal.        Assessment/plan: 1. Hyperlipidemia, mixed Labs reviewed and all to goal. Continue current medication.   2. Osteopenia, unspecified location frax score discussed. No indication for treatment at this time. Continue calcium/vitamin D and advised she increase her calcium to 1200mg /day and increase weight bearing exercise. Repeat in 3 years. Will also look for her old DEXA scan in her records.   3. Hypothyroidism (acquired) TSH near goal. Want her to take on empty stomach and we will repeat labs in 6-8 weeks.   4. Essential hypertension Blood pressure is to goal. Continue current anti-hypertensive medications. Refills not given and routine lab reviewed today. Recommended routine exercise and healthy diet including DASH diet and mediterranean diet.  F/u in 6 months.    Return in about 6 months (around 01/26/2020) for routine blood pressure check up .    Orma Flaming, MD [place]   07/28/2019

## 2019-07-28 NOTE — Patient Instructions (Signed)
Your bone density shows you to be osteopenic. This means you have thinning bones and are at risk for osteoporosis. I would recommend that you do weight bearing activities to help increase your bone density and start calcium and vitamin D daily. Recommend 1200mg  calcium and 800-1000IU/vitamin D daily. Would recheck your bone scan in 3 years time. Thanks for getting this.    Osteopenia  Osteopenia is a loss of thickness (density) inside of the bones. Another name for osteopenia is low bone mass. Mild osteopenia is a normal part of aging. It is not a disease, and it does not cause symptoms. However, if you have osteopenia and continue to lose bone mass, you could develop a condition that causes the bones to become thin and break more easily (osteoporosis). You may also lose some height, have back pain, and have a stooped posture. Although osteopenia is not a disease, making changes to your lifestyle and diet can help to prevent osteopenia from developing into osteoporosis. What are the causes? Osteopenia is caused by loss of calcium in the bones.  Bones are constantly changing. Old bone cells are continually being replaced with new bone cells. This process builds new bone. The mineral calcium is needed to build new bone and maintain bone density. Bone density is usually highest around age 81. After that, most people's bodies cannot replace all the bone they have lost with new bone. What increases the risk? You are more likely to develop this condition if:  You are older than age 68.  You are a woman who went through menopause early.  You have a long illness that keeps you in bed.  You do not get enough exercise.  You lack certain nutrients (malnutrition).  You have an overactive thyroid gland (hyperthyroidism).  You smoke.  You drink a lot of alcohol.  You are taking medicines that weaken the bones, such as steroids. What are the signs or symptoms? This condition does not cause any  symptoms. You may have a slightly higher risk for bone breaks (fractures), so getting fractures more easily than normal may be an indication of osteopenia. How is this diagnosed? Your health care provider can diagnose this condition with a special type of X-ray exam that measures bone density (dual-energy X-ray absorptiometry, DEXA). This test can measure bone density in your hips, spine, and wrists. Osteopenia has no symptoms, so this condition is usually diagnosed after a routine bone density screening test is done for osteoporosis. This routine screening is usually done for:  Women who are age 44 or older.  Men who are age 61 or older. If you have risk factors for osteopenia, you may have the screening test at an earlier age. How is this treated? Making dietary and lifestyle changes can lower your risk for osteoporosis. If you have severe osteopenia that is close to becoming osteoporosis, your health care provider may prescribe medicines and dietary supplements such as calcium and vitamin D. These supplements help to rebuild bone density. Follow these instructions at home:   Take over-the-counter and prescription medicines only as told by your health care provider. These include vitamins and supplements.  Eat a diet that is high in calcium and vitamin D. ? Calcium is found in dairy products, beans, salmon, and leafy green vegetables like spinach and broccoli. ? Look for foods that have vitamin D and calcium added to them (fortified foods), such as orange juice, cereal, and bread.  Do 30 or more minutes of a weight-bearing exercise every day,  such as walking, jogging, or playing a sport. These types of exercises strengthen the bones.  Take precautions at home to lower your risk of falling, such as: ? Keeping rooms well-lit and free of clutter, such as cords. ? Installing safety rails on stairs. ? Using rubber mats in the bathroom or other areas that are often wet or slippery.  Do not use  any products that contain nicotine or tobacco, such as cigarettes and e-cigarettes. If you need help quitting, ask your health care provider.  Avoid alcohol or limit alcohol intake to no more than 1 drink a day for nonpregnant women and 2 drinks a day for men. One drink equals 12 oz of beer, 5 oz of wine, or 1 oz of hard liquor.  Keep all follow-up visits as told by your health care provider. This is important. Contact a health care provider if:  You have not had a bone density screening for osteoporosis and you are: ? A woman, age 61 or older. ? A man, age 58 or older.  You are a postmenopausal woman who has not had a bone density screening for osteoporosis.  You are older than age 66 and you want to know if you should have bone density screening for osteoporosis. Summary  Osteopenia is a loss of thickness (density) inside of the bones. Another name for osteopenia is low bone mass.  Osteopenia is not a disease, but it may increase your risk for a condition that causes the bones to become thin and break more easily (osteoporosis).  You may be at risk for osteopenia if you are older than age 26 or if you are a woman who went through early menopause.  Osteopenia does not cause any symptoms, but it can be diagnosed with a bone density screening test.  Dietary and lifestyle changes are the first treatment for osteopenia. These may lower your risk for osteoporosis. This information is not intended to replace advice given to you by your health care provider. Make sure you discuss any questions you have with your health care provider. Document Released: 07/04/2017 Document Revised: 09/07/2017 Document Reviewed: 07/04/2017 Elsevier Patient Education  2020 [employer].

## 2019-08-08 ENCOUNTER — Ambulatory Visit
Admission: RE | Admit: 2019-08-08 | Discharge: 2019-08-08 | Disposition: A | Discharge status: Home / Self Care | Payer: Medicare Other | Source: Ambulatory Visit | Attending: Family Medicine | Admitting: Family Medicine

## 2019-08-08 ENCOUNTER — Other Ambulatory Visit: Payer: Self-pay

## 2019-08-08 DIAGNOSIS — Z0000 Encounter for general adult medical examination without abnormal findings: Secondary | ICD-10-CM

## 2019-08-14 ENCOUNTER — Encounter: Payer: Self-pay | Admitting: Family Medicine

## 2019-08-14 NOTE — Telephone Encounter (Signed)
Lea,   Can you please advise?

## 2019-08-15 ENCOUNTER — Encounter: Payer: Self-pay | Admitting: *Deleted

## 2019-08-15 DIAGNOSIS — I219 Acute myocardial infarction, unspecified: Secondary | ICD-10-CM | POA: Insufficient documentation

## 2019-08-19 ENCOUNTER — Other Ambulatory Visit: Payer: Self-pay

## 2019-08-19 ENCOUNTER — Encounter: Payer: Self-pay | Admitting: Gastroenterology

## 2019-08-19 ENCOUNTER — Ambulatory Visit (AMBULATORY_SURGERY_CENTER): Payer: Medicare Other | Admitting: Gastroenterology

## 2019-08-19 VITALS — BP 135/72 | HR 61 | Temp 98.4°F | Resp 18 | Ht 62.0 in | Wt 109.0 lb

## 2019-08-19 DIAGNOSIS — Z1211 Encounter for screening for malignant neoplasm of colon: Secondary | ICD-10-CM | POA: Diagnosis not present

## 2019-08-19 DIAGNOSIS — D128 Benign neoplasm of rectum: Secondary | ICD-10-CM | POA: Diagnosis not present

## 2019-08-19 DIAGNOSIS — D120 Benign neoplasm of cecum: Secondary | ICD-10-CM | POA: Diagnosis not present

## 2019-08-19 MED ORDER — SODIUM CHLORIDE 0.9 % IV SOLN: 500.0000 mL | Freq: Once | INTRAVENOUS | Status: DC

## 2019-08-19 NOTE — Op Note (Signed)
[place] Patient Name: Crystal Barnes Procedure Date: 08/19/2019 8:01 AM MRN: [id number] Endoscopist: Justice Britain , MD Age: 68 Referring MD:  Date of Birth: 07-Jul-1951 Gender: Female Account #: [id number] Procedure:                Colonoscopy Indications:              Screening for colorectal malignant neoplasm Medicines:                Monitored Anesthesia Care Procedure:                Pre-Anesthesia Assessment:                           - Prior to the procedure, a History and Physical                            was performed, and patient medications and                            allergies were reviewed. The patient's tolerance of                            previous anesthesia was also reviewed. The risks                            and benefits of the procedure and the sedation                            options and risks were discussed with the patient.                            All questions were answered, and informed consent                            was obtained. Prior Anticoagulants: The patient has                            taken no previous anticoagulant or antiplatelet                            agents. ASA Grade Assessment: II - A patient with                            mild systemic disease. After reviewing the risks                            and benefits, the patient was deemed in                            satisfactory condition to undergo the procedure.                           After obtaining informed consent, the colonoscope  was passed under direct vision. Throughout the                            procedure, the patient's blood pressure, pulse, and                            oxygen saturations were monitored continuously. The                            Colonoscope was introduced through the anus and                            advanced to the the cecum, identified by                            appendiceal orifice and  ileocecal valve. The                            colonoscopy was extremely difficult due to a                            redundant colon, significant looping and a tortuous                            colon. Successful completion of the procedure was                            aided by changing the patient's position, using                            manual pressure, withdrawing and reinserting the                            scope, straightening and shortening the scope to                            obtain bowel loop reduction and using scope                            torsion. The patient tolerated the procedure. The                            quality of the bowel preparation was adequate. The                            ileocecal valve, appendiceal orifice, and rectum                            were photographed. Scope In: 8:07:42 AM Scope Out: 8:47:42 AM Scope Withdrawal Time: 0 hours 15 minutes 2 seconds  Total Procedure Duration: 0 hours 40 minutes 0 seconds  Findings:                 The digital rectal exam findings include  hemorrhoids. Pertinent negatives include no                            palpable rectal lesions.                           Extensive amounts of semi-liquid stool was found in                            the entire colon, interfering with visualization.                            Lavage of the area was performed using copious                            amounts, resulting in clearance with adequate                            visualization.                           The colon (entire examined portion) revealed                            grossly excessive looping.                           A 3 mm polyp was found in the cecum. The polyp was                            sessile. The polyp was removed with a cold snare.                            Resection and retrieval were complete.                           A 30 mm polyp was found in the rectum.  The polyp                            was sessile and non-granular lateral spreading.                            Single biopsy taken with a cold forceps for                            histology at edge in order to not increase risk for                            fibrosis. Polyp will require EMR attempt in                            hospital based-setting for advanced resection  attempt.                           Non-bleeding non-thrombosed internal hemorrhoids                            were found during retroflexion, during perianal                            exam and during digital exam. The hemorrhoids were                            Grade II (internal hemorrhoids that prolapse but                            reduce spontaneously). Complications:            No immediate complications. Estimated Blood Loss:     Estimated blood loss was minimal. Impression:               - Hemorrhoids found on digital rectal exam.                           - Stool in the entire examined colon.                           - There was significant looping of the colon.                           - One 3 mm polyp in the cecum, removed with a cold                            snare. Resected and retrieved.                           - One 30 mm polyp in the rectum. Biopsied. Will                            require EMR in hospital-based setting.                           - Non-bleeding non-thrombosed internal hemorrhoids. Recommendation:           - The patient will be observed post-procedure,                            until all discharge criteria are met.                           - Discharge patient to home.                           - Patient has a contact number available for                            emergencies. The signs and symptoms of potential  delayed complications were discussed with the                            patient. Return to normal activities  tomorrow.                            Written discharge instructions were provided to the                            patient.                           - High fiber diet.                           - Continue present medications.                           - Await pathology results.                           - Will discuss with patient and family EMR attempt.                            If she would like clinic appointment then can set                            that up. Will be a Flexible Sigmoidoscopy with EMR                            attempt. Can use Miralax or Suprep for preparation                            next time. For full colonoscopy would recommend use                            of adult colonoscope next time.                           - The findings and recommendations were discussed                            with the patient. Justice Britain, MD 08/19/2019 8:56:07 AM

## 2019-08-19 NOTE — Progress Notes (Signed)
CW vitals, JB temps and SB IV.

## 2019-08-19 NOTE — Patient Instructions (Addendum)
YOU HAD AN ENDOSCOPIC PROCEDURE TODAY AT [facility]:   Refer to the procedure report that was given to you for any specific questions about what was found during the examination.  If the procedure report does not answer your questions, please call your gastroenterologist to clarify.  If you requested that your care partner not be given the details of your procedure findings, then the procedure report has been included in a sealed envelope for you to review at your convenience later.  YOU SHOULD EXPECT: Some feelings of bloating in the abdomen. Passage of more gas than usual.  Walking can help get rid of the air that was put into your GI tract during the procedure and reduce the bloating. If you had a lower endoscopy (such as a colonoscopy or flexible sigmoidoscopy) you may notice spotting of blood in your stool or on the toilet paper. If you underwent a bowel prep for your procedure, you may not have a normal bowel movement for a few days.  Please Note:  You might notice some irritation and congestion in your nose or some drainage.  This is from the oxygen used during your procedure.  There is no need for concern and it should clear up in a day or so.  SYMPTOMS TO REPORT IMMEDIATELY:   Following lower endoscopy (colonoscopy or flexible sigmoidoscopy):  Excessive amounts of blood in the stool  Significant tenderness or worsening of abdominal pains  Swelling of the abdomen that is new, acute  Fever of 100F or higher   For urgent or emergent issues, a gastroenterologist can be reached at any hour by calling [phone].   DIET:  We do recommend a small meal at first, but then you may proceed to your regular diet.  Drink plenty of fluids but you should avoid alcoholic beverages for 24 hours.  MEDICATIONS: Continue present medications.  Please see handouts given to you by your recovery nurse.  FOLLOW UP: Dr. Donneta Romberg office nurse will call you to set up an appointment at  the clinic and for colonoscopy to be performed at the hospital.  ACTIVITY:  You should plan to take it easy for the rest of today and you should NOT DRIVE or use heavy machinery until tomorrow (because of the sedation medicines used during the test).    FOLLOW UP: Our staff will call the number listed on your records 48-72 hours following your procedure to check on you and address any questions or concerns that you may have regarding the information given to you following your procedure. If we do not reach you, we will leave a message.  We will attempt to reach you two times.  During this call, we will ask if you have developed any symptoms of COVID 19. If you develop any symptoms (ie: fever, flu-like symptoms, shortness of breath, cough etc.) before then, please call [phone].  If you test positive for Covid 19 in the 2 weeks post procedure, please call and report this information to [place].    If any biopsies were taken you will be contacted by phone or by letter within the next 1-3 weeks.  Please call us at [phone] if you have not heard about the biopsies in 3 weeks.   Thank you for allowing [place] to provide for your healthcare needs today.   SIGNATURES/CONFIDENTIALITY: You and/or your care partner have signed paperwork which will be entered into your electronic medical record.  These signatures attest to the fact that that the  information above on your After Visit Summary has been reviewed and is understood.  Full responsibility of the confidentiality of this discharge information lies with you and/or your care-partner.

## 2019-08-19 NOTE — Progress Notes (Signed)
To PACU, VSS. Report to [email or web address] 

## 2019-08-19 NOTE — Progress Notes (Signed)
Called to room to assist during endoscopic procedure.  Patient ID and intended procedure confirmed with present staff. Received instructions for my participation in the procedure from the performing physician.  

## 2019-08-21 ENCOUNTER — Encounter: Payer: Self-pay | Admitting: Gastroenterology

## 2019-08-21 ENCOUNTER — Telehealth: Payer: Self-pay

## 2019-08-21 NOTE — Telephone Encounter (Signed)
Changed to 08/28/19 at 230 pm appt with Dr Rush Landmark.  Left message on machine to call back

## 2019-08-21 NOTE — Telephone Encounter (Signed)
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 Thanks.GM

## 2019-08-21 NOTE — Telephone Encounter (Signed)
Thanks for update. GM 

## 2019-08-21 NOTE — Telephone Encounter (Signed)
  Follow up Call-  Call back number 08/19/2019  Post procedure Call Back phone  # [phone]  Permission to leave phone message Yes  Some recent data might be hidden     Patient questions:  Do you have a fever, pain , or abdominal swelling? No. Pain Score  0 *  Have you tolerated food without any problems? Yes.    Have you been able to return to your normal activities? Yes.    Do you have any questions about your discharge instructions: Diet   No. Medications  No. Follow up visit  No.  Do you have questions or concerns about your Care? No.  Actions: * If pain score is 4 or above: 1. No action needed, pain <4.Have you developed a fever since your procedure? no  2.   Have you had an respiratory symptoms (SOB or cough) since your procedure? no  3.   Have you tested positive for COVID 19 since your procedure no  4.   Have you had any family members/close contacts diagnosed with the COVID 19 since your procedure?  no   If yes to any of these questions please route to Joylene John, RN and Alphonsa Gin, Therapist, sports.

## 2019-08-21 NOTE — Telephone Encounter (Signed)
The pt has returned call and is aware of the appt for 11/19.

## 2019-08-21 NOTE — Telephone Encounter (Signed)
12/31 at 1030 am appt with Dr Rush Landmark to discuss EMR.  Left message on machine to call back   Dr Rush Landmark is this ok or too far out?

## 2019-08-21 NOTE — Telephone Encounter (Signed)
Ideally I would like to talk with her sooner.  Okay to overbook in the morning or afternoon of a session session to make that happen.

## 2019-08-26 ENCOUNTER — Other Ambulatory Visit: Payer: Self-pay | Admitting: Family Medicine

## 2019-08-28 ENCOUNTER — Encounter: Payer: Self-pay | Admitting: Gastroenterology

## 2019-08-28 ENCOUNTER — Ambulatory Visit: Payer: Medicare Other | Admitting: Gastroenterology

## 2019-08-28 ENCOUNTER — Other Ambulatory Visit: Payer: Self-pay

## 2019-08-28 VITALS — BP 128/74 | HR 76 | Temp 97.9°F | Ht 62.5 in | Wt 113.6 lb

## 2019-08-28 DIAGNOSIS — D126 Benign neoplasm of colon, unspecified: Secondary | ICD-10-CM

## 2019-08-28 DIAGNOSIS — Z86010 Personal history of colonic polyps: Secondary | ICD-10-CM

## 2019-08-28 NOTE — Progress Notes (Signed)
[place] VISIT   Primary Care Provider Orma Flaming, [place] [address] [phone]  Patient Profile: Crystal Barnes is a 68 y.o. female with a pmh significant for hypertension, hyperlipidemia, CAD, arthritis, thyroid disease, cataracts, colon polyps.  The patient presents to the [facility] for an evaluation and management of problem(s) noted below:  Problem List 1. Hx of adenomatous colonic polyps   2. Serrated adenoma of colon     History of Present Illness This is a patient that I met for a surveillance colonoscopy earlier this month.  We found a large lesion spanning to fold in the rectum that was sampled and returned as a traditional serrated adenoma.  The lesion was felt to be too large to be removed safely in the [facility] and she comes in today for consideration of advanced polyp resection.  She did have a prior colonoscopy closer to the age of 23 but remembers being told that she may need a follow-up in 15 years.  The patient does not take significant nonsteroidals or BC/Goody powders.  Patient has not had an upper endoscopy.  She does not have any other significant GI complaints or symptoms.  She has not had any bleeding post procedure.  Bowel habits are relatively normal and she does have a high-fiber diet.  GI Review of Systems Positive as above Negative for dysphagia, pyrosis, odynophagia, nausea, vomiting, abdominal pain, melena, hematochezia  Review of Systems General: Denies fevers/chills/weight loss HEENT: Denies oral lesions Cardiovascular: Denies chest pain Pulmonary: Denies shortness of breath Gastroenterological: See HPI Genitourinary: Denies darkened urine Hematological: Denies easy bruising/bleeding Dermatological: Denies jaundice Psychological: Mood is stable   Medications Current Outpatient Medications  Medication Sig Dispense Refill  . gabapentin (NEURONTIN) 300 MG capsule TAKE  1 CAPSULE BY MOUTH 4  TIMES DAILY 120 capsule 11  . levothyroxine (SYNTHROID) 88 MCG tablet Take 1 tablet (88 mcg total) by mouth daily before breakfast. 90 tablet 3  . losartan (COZAAR) 50 MG tablet Take 1 tablet (50 mg total) by mouth daily. 90 tablet 3  . rosuvastatin (CRESTOR) 40 MG tablet Take 1 tablet (40 mg total) by mouth daily. 90 tablet 3  . traMADol (ULTRAM) 50 MG tablet TAKE 2 TABLETS BY MOUTH 4  TIMES DAILY 240 tablet 0   Current Facility-Administered Medications  Medication Dose Route Frequency Provider Last Rate Last Dose  . 0.9 %  sodium chloride infusion  500 mL Intravenous Once Mansouraty, Telford Nab., MD        Allergies No Known Allergies  Histories Past Medical History:  Diagnosis Date  . Arthritis   . CAD (coronary artery disease)   . Cataract   . Hyperlipidemia   . Hypertension   . Myocardial infarction ([place])   . Thyroid disease    Past Surgical History:  Procedure Laterality Date  . ABDOMINAL HYSTERECTOMY  2008  . CATARACT EXTRACTION Bilateral   . CORONARY STENT PLACEMENT  2014  . TONSILLECTOMY     Social History   Socioeconomic History  . Marital status: Single    Spouse name: Not on file  . Number of children: Not on file  . Years of education: Not on file  . Highest education level: Not on file  Occupational History  . Not on file  Social Needs  . Financial resource strain: Not on file  . Food insecurity    Worry: Not on file    Inability: Not on file  . Transportation  needs    Medical: Not on file    Non-medical: Not on file  Tobacco Use  . Smoking status: Former Research scientist (life sciences)  . Smokeless tobacco: Never Used  . Tobacco comment: Quit 24 years ago  Substance and Sexual Activity  . Alcohol use: Not Currently  . Drug use: Never  . Sexual activity: Not on file  Lifestyle  . Physical activity    Days per week: Not on file    Minutes per session: Not on file  . Stress: Not on file  Relationships  . Social Herbalist on phone:  Not on file    Gets together: Not on file    Attends religious service: Not on file    Active member of club or organization: Not on file    Attends meetings of clubs or organizations: Not on file    Relationship status: Not on file  . Intimate partner violence    Fear of current or ex partner: Not on file    Emotionally abused: Not on file    Physically abused: Not on file    Forced sexual activity: Not on file  Other Topics Concern  . Not on file  Social History Narrative  . Not on file   Family History  Problem Relation Age of Onset  . Esophageal cancer Father   . Colon cancer Neg Hx   . Rectal cancer Neg Hx   . Stomach cancer Neg Hx   . Inflammatory bowel disease Neg Hx   . Liver disease Neg Hx   . Pancreatic cancer Neg Hx    I have reviewed her medical, social, and family history in detail and updated the electronic medical record as necessary.    PHYSICAL EXAMINATION  BP 128/74   Pulse 76   Temp 97.9 F (36.6 C) (Temporal)   Ht 5' 2.5" (1.588 m)   Wt 113 lb 9.6 oz (51.5 kg)   LMP  (LMP Unknown)   BMI 20.45 kg/m  Wt Readings from Last 3 Encounters:  08/28/19 113 lb 9.6 oz (51.5 kg)  08/19/19 109 lb (49.4 kg)  07/28/19 109 lb 6.4 oz (49.6 kg)  GEN: NAD, appears stated age, doesn't appear chronically ill PSYCH: Cooperative, without pressured speech EYE: Conjunctivae pink, sclerae anicteric ENT: MMM, without oral ulcers, no erythema or exudates noted NECK: Supple CV: RR without R/Gs RESP: CTAB posteriorly, without wheezing GI: NABS, soft, NT/ND, without rebound or guarding, no HSM appreciated MSK/EXT: No lower extremity edema SKIN: No jaundice NEURO:  Alert & Oriented x 3, no focal deficits   REVIEW OF DATA  I reviewed the following data at the time of this encounter:  GI Procedures and Studies  November 2020 colonoscopy - Hemorrhoids found on digital rectal exam. - Stool in the entire examined colon. - There was significant looping of the colon. -  One 3 mm polyp in the cecum, removed with a cold snare. Resected and retrieved. - One 30 mm polyp in the rectum. Biopsied. Will require EMR in hospital-based setting. - Non-bleeding non-thrombosed internal hemorrhoids.  Laboratory Studies  Reviewed those in epic  Imaging Studies  No relevant studies to review   ASSESSMENT  Ms. Dickison is a 68 y.o. female  with a pmh significant for hypertension, hyperlipidemia, CAD, arthritis, thyroid disease, cataracts, colon polyps.  The patient is seen today for evaluation and management of:  1. Hx of adenomatous colonic polyps   2. Serrated adenoma of colon    The patient  is clinically and hemodynamically stable.  As I performed her recent colonoscopy, I do feel that it is reasonable to pursue an Advanced Polypectomy attempt of the polyp/lesion.  We discussed some of the techniques of advanced polypectomy which include Endoscopic Mucosal Resection, OVESCO Full-Thickness Resection, Endorotor Morcellation, and Tissue Ablation via Fulguration.  The risks and benefits of endoscopic evaluation were discussed with the patient; these include but are not limited to the risk of perforation, infection, bleeding, missed lesions, lack of diagnosis, severe illness requiring hospitalization, as well as anesthesia and sedation related illnesses.  During attempts at advanced polypectomy, the risks of bleeding and perforation/leak are increased as opposed to diagnostic and screening colonoscopies, and that was discussed with the patient as well.   In addition, I explained that with the possible need for piecemeal resection, subsequent short-interval endoscopic evaluation for follow up and potential retreatment of the lesion/area may be necessary.  I did offer, a referral to surgery in order for patient to have opportunity to discuss surgical management/intervention prior to finalizing decision for attempt at endoscopic removal, however, the patient deferred on this.  If, after  attempt at removal of the polyp, it is found that the patient has a complication or that an invasive lesion or malignant lesion is found, or that the polyp continues to recur, the patient is aware and understands that surgery may still be indicated/required.  All patient questions were answered, to the best of my ability, and the patient agrees to the aforementioned plan of action with follow-up as indicated.   PLAN  Proceed with scheduling flexible sigmoidoscopy with EMR 90-minute slot Laboratories as outlined below   Orders Placed This Encounter  Procedures  . CBC  . Basic Metabolic Panel (BMET)  . INR/PT    New Prescriptions   No medications on file   Modified Medications   No medications on file    Planned Follow Up No follow-ups on file.   Justice Britain, MD [facility] Advanced Endoscopy Office # [phone]

## 2019-08-28 NOTE — Patient Instructions (Signed)
It has been recommended to you by your physician that you have a(n) Flexsig +EMR at hospital completed. We did not schedule the procedure(s) today. Our office will contact you once hospital schedule is available.    Your provider has requested that you go to the basement level for lab work 1-2 weeks before procedure. Press "B" on the elevator. The lab is located at the first door on the left as you exit the elevator.  If you are age 66 or older, your body mass index should be between 23-30. Your Body mass index is 20.45 kg/m. If this is out of the aforementioned range listed, please consider follow up with your Primary Care Provider.  Sample of Suprep was given to you today. Please keep this for your upcoming procedure.   Thank you for choosing me and [facility].  Dr. Rush Landmark

## 2019-09-02 ENCOUNTER — Other Ambulatory Visit: Payer: Medicare Other

## 2019-09-09 ENCOUNTER — Encounter: Payer: Self-pay | Admitting: Family Medicine

## 2019-09-18 ENCOUNTER — Other Ambulatory Visit: Payer: Self-pay

## 2019-09-19 ENCOUNTER — Other Ambulatory Visit (INDEPENDENT_AMBULATORY_CARE_PROVIDER_SITE_OTHER): Payer: Medicare Other

## 2019-09-19 DIAGNOSIS — E039 Hypothyroidism, unspecified: Secondary | ICD-10-CM | POA: Diagnosis not present

## 2019-09-19 LAB — TSH: TSH: 3.73 u[IU]/mL (ref 0.35–4.50)

## 2019-09-19 LAB — T4, FREE: Free T4: 0.76 ng/dL (ref 0.60–1.60)

## 2019-09-22 ENCOUNTER — Other Ambulatory Visit: Payer: Self-pay | Admitting: Family Medicine

## 2019-09-23 NOTE — Telephone Encounter (Signed)
Last OV: 07/28/19 Next OV: 01/28/20 PMP website checked: last filled on 08/28/19

## 2019-09-29 ENCOUNTER — Ambulatory Visit: Payer: Medicare Other | Attending: Internal Medicine

## 2019-09-29 DIAGNOSIS — Z20822 Contact with and (suspected) exposure to covid-19: Secondary | ICD-10-CM

## 2019-09-30 ENCOUNTER — Other Ambulatory Visit: Payer: Medicare Other

## 2019-09-30 LAB — NOVEL CORONAVIRUS, NAA: SARS-CoV-2, NAA: NOT DETECTED

## 2019-10-09 ENCOUNTER — Ambulatory Visit: Payer: Medicare Other | Admitting: Gastroenterology

## 2019-10-23 ENCOUNTER — Telehealth: Payer: Self-pay | Admitting: Gastroenterology

## 2019-10-23 DIAGNOSIS — Z86010 Personal history of colonic polyps: Secondary | ICD-10-CM

## 2019-10-23 DIAGNOSIS — D126 Benign neoplasm of colon, unspecified: Secondary | ICD-10-CM

## 2019-10-23 NOTE — Telephone Encounter (Signed)
[place], [place] to work on scheduling.   Category 2. Thanks. GM

## 2019-10-23 NOTE — Telephone Encounter (Signed)
Ro do you have this pt on your list?

## 2019-10-23 NOTE — Telephone Encounter (Signed)
She is on my list. I will have to ask Dr. Rush Landmark to review and see if we can schedule her due to the hospital restrictions. Dr. Rush Landmark will you review and let me know if we can schedule patient? If so can you give priority level?

## 2019-10-24 NOTE — Telephone Encounter (Signed)
Crystal Barnes pt this morning. Advised her that I will look at Dr. Donneta Romberg schedule at the hospital to see when I can I get her scheduled. Pt voiced understanding and knows that I will give her a call back this afternoon.

## 2019-10-27 ENCOUNTER — Other Ambulatory Visit: Payer: Self-pay | Admitting: Family Medicine

## 2019-10-27 ENCOUNTER — Other Ambulatory Visit: Payer: Self-pay

## 2019-10-27 DIAGNOSIS — D126 Benign neoplasm of colon, unspecified: Secondary | ICD-10-CM

## 2019-10-27 DIAGNOSIS — Z86010 Personal history of colonic polyps: Secondary | ICD-10-CM

## 2019-10-27 DIAGNOSIS — Z860101 Personal history of adenomatous and serrated colon polyps: Secondary | ICD-10-CM

## 2019-10-27 NOTE — Telephone Encounter (Signed)
Pt scheduled for 11/24/2019 @ 10:00am [facility]- pt will need to arrive at 8:30am. Pt scheduled for Covid Testing on 11/20/2019 @ 10:00am. Pt was given Suprep sample at visit to use as prep for flex -sig. Instructions will be mailed to pt. Pt advised if she has any question once she receives information to call office and ask for myself or Patty-RN.

## 2019-10-28 NOTE — Telephone Encounter (Signed)
Rx request 

## 2019-10-29 MED ORDER — TRAMADOL HCL 50 MG PO TABS: 50.0000 mg | ORAL_TABLET | Freq: Four times a day (QID) | ORAL | 0 refills | Status: DC

## 2019-10-31 ENCOUNTER — Telehealth: Payer: Self-pay | Admitting: Gastroenterology

## 2019-10-31 NOTE — Telephone Encounter (Signed)
Dr Rush Landmark the pt states she had a BM this morning with BRB on the stool. No other episodes as of now.  She has a flex scheduled on 11/24/19.  I advised her to keep the appt as planned and call back if the bleeding becomes constant or heavy.  Also if she develops any further symptoms.  Please advise of any further recommendations.

## 2019-10-31 NOTE — Telephone Encounter (Signed)
Patty, Thank you for the update.  I agree that if bleeding becomes more significant to please reach out to [place] and we can decide about evaluation at that time.  Hopefully this turns out to just be hemorrhoidal.  Unlikely to be related to the lesion that we found. GM

## 2019-11-03 ENCOUNTER — Telehealth: Payer: Self-pay | Admitting: Gastroenterology

## 2019-11-03 NOTE — Telephone Encounter (Signed)
The pt states she had a small clot of blood after BM this morning.  The stool was somewhat hard and she does have a history of hemorrhoids.  She was advised to call back if the bleeding continues or she develops other symptoms.  The pt has been advised of the information and verbalized understanding.

## 2019-11-03 NOTE — Telephone Encounter (Signed)
Pt states that this morning she had a cot of blood after a bm. She was told to call us if that happened.

## 2019-11-07 ENCOUNTER — Encounter: Payer: Self-pay | Admitting: Family Medicine

## 2019-11-20 ENCOUNTER — Other Ambulatory Visit: Payer: Self-pay

## 2019-11-20 ENCOUNTER — Other Ambulatory Visit (HOSPITAL_COMMUNITY)
Admission: RE | Admit: 2019-11-20 | Discharge: 2019-11-20 | Disposition: A | Discharge status: Home / Self Care | Payer: Medicare Other | Source: Ambulatory Visit | Attending: Gastroenterology | Admitting: Gastroenterology

## 2019-11-20 ENCOUNTER — Encounter (HOSPITAL_COMMUNITY): Payer: Self-pay | Admitting: Gastroenterology

## 2019-11-20 DIAGNOSIS — Z01812 Encounter for preprocedural laboratory examination: Secondary | ICD-10-CM | POA: Diagnosis present

## 2019-11-20 DIAGNOSIS — Z20822 Contact with and (suspected) exposure to covid-19: Secondary | ICD-10-CM | POA: Insufficient documentation

## 2019-11-20 LAB — SARS CORONAVIRUS 2 (TAT 6-24 HRS): SARS Coronavirus 2: NEGATIVE

## 2019-11-20 NOTE — Progress Notes (Signed)
Crystal Barnes denies chest pain or  shortness of   breath. Patient was tested for Covid today and is aware she is to remain in quarantine until after procedure.  Crystal Barnes does not have any questions about prep.   Crystal Barnes was not given any instructions regarding ASA, I iwill call Dr. Donneta Romberg office and ask the office to call patient.

## 2019-11-23 ENCOUNTER — Encounter: Payer: Self-pay | Admitting: Family Medicine

## 2019-11-24 ENCOUNTER — Ambulatory Visit (HOSPITAL_COMMUNITY)
Admission: RE | Admit: 2019-11-24 | Discharge: 2019-11-24 | Disposition: A | Discharge status: Home / Self Care | Payer: Medicare Other | Attending: Gastroenterology | Admitting: Gastroenterology

## 2019-11-24 ENCOUNTER — Encounter (HOSPITAL_COMMUNITY)
Admission: RE | Disposition: A | Discharge status: Home / Self Care | Payer: Self-pay | Source: Home / Self Care | Attending: Gastroenterology

## 2019-11-24 ENCOUNTER — Encounter (HOSPITAL_COMMUNITY): Payer: Self-pay | Admitting: Gastroenterology

## 2019-11-24 ENCOUNTER — Ambulatory Visit (HOSPITAL_COMMUNITY): Payer: Medicare Other | Admitting: Certified Registered"

## 2019-11-24 ENCOUNTER — Other Ambulatory Visit: Payer: Self-pay

## 2019-11-24 DIAGNOSIS — Z9842 Cataract extraction status, left eye: Secondary | ICD-10-CM | POA: Diagnosis not present

## 2019-11-24 DIAGNOSIS — Z9841 Cataract extraction status, right eye: Secondary | ICD-10-CM | POA: Diagnosis not present

## 2019-11-24 DIAGNOSIS — Q438 Other specified congenital malformations of intestine: Secondary | ICD-10-CM | POA: Insufficient documentation

## 2019-11-24 DIAGNOSIS — Z86010 Personal history of colonic polyps: Secondary | ICD-10-CM | POA: Diagnosis not present

## 2019-11-24 DIAGNOSIS — K644 Residual hemorrhoidal skin tags: Secondary | ICD-10-CM | POA: Insufficient documentation

## 2019-11-24 DIAGNOSIS — K641 Second degree hemorrhoids: Secondary | ICD-10-CM | POA: Diagnosis not present

## 2019-11-24 DIAGNOSIS — K621 Rectal polyp: Secondary | ICD-10-CM | POA: Diagnosis not present

## 2019-11-24 DIAGNOSIS — Z90710 Acquired absence of both cervix and uterus: Secondary | ICD-10-CM | POA: Insufficient documentation

## 2019-11-24 DIAGNOSIS — I10 Essential (primary) hypertension: Secondary | ICD-10-CM | POA: Insufficient documentation

## 2019-11-24 DIAGNOSIS — I252 Old myocardial infarction: Secondary | ICD-10-CM | POA: Diagnosis not present

## 2019-11-24 DIAGNOSIS — Z1211 Encounter for screening for malignant neoplasm of colon: Secondary | ICD-10-CM | POA: Insufficient documentation

## 2019-11-24 DIAGNOSIS — M1990 Unspecified osteoarthritis, unspecified site: Secondary | ICD-10-CM | POA: Insufficient documentation

## 2019-11-24 DIAGNOSIS — Z800 Family history of malignant neoplasm of digestive organs: Secondary | ICD-10-CM | POA: Diagnosis not present

## 2019-11-24 DIAGNOSIS — D126 Benign neoplasm of colon, unspecified: Secondary | ICD-10-CM

## 2019-11-24 DIAGNOSIS — Z87891 Personal history of nicotine dependence: Secondary | ICD-10-CM | POA: Insufficient documentation

## 2019-11-24 DIAGNOSIS — E039 Hypothyroidism, unspecified: Secondary | ICD-10-CM | POA: Diagnosis not present

## 2019-11-24 DIAGNOSIS — Z955 Presence of coronary angioplasty implant and graft: Secondary | ICD-10-CM | POA: Diagnosis not present

## 2019-11-24 DIAGNOSIS — E785 Hyperlipidemia, unspecified: Secondary | ICD-10-CM | POA: Diagnosis not present

## 2019-11-24 DIAGNOSIS — I2510 Atherosclerotic heart disease of native coronary artery without angina pectoris: Secondary | ICD-10-CM | POA: Diagnosis not present

## 2019-11-24 HISTORY — PX: HEMOSTASIS CLIP PLACEMENT: SHX6857

## 2019-11-24 HISTORY — PX: ENDOSCOPIC MUCOSAL RESECTION: SHX6839

## 2019-11-24 HISTORY — DX: Hypothyroidism, unspecified: E03.9

## 2019-11-24 HISTORY — PX: FLEXIBLE SIGMOIDOSCOPY: SHX5431

## 2019-11-24 HISTORY — PX: SUBMUCOSAL LIFTING INJECTION: SHX6855

## 2019-11-24 SURGERY — SIGMOIDOSCOPY, FLEXIBLE
Anesthesia: Monitor Anesthesia Care

## 2019-11-24 MED ORDER — LIDOCAINE HCL URETHRAL/MUCOSAL 2 % EX GEL
CUTANEOUS | Status: AC
  Filled 2019-11-24: qty 20

## 2019-11-24 MED ORDER — LIDOCAINE HCL (CARDIAC) PF 100 MG/5ML IV SOSY
PREFILLED_SYRINGE | INTRAVENOUS | Status: DC | PRN
  Administered 2019-11-24: 40 mg via INTRAVENOUS

## 2019-11-24 MED ORDER — TRAMADOL HCL 50 MG PO TABS: 100.0000 mg | ORAL_TABLET | Freq: Four times a day (QID) | ORAL | 0 refills | Status: DC

## 2019-11-24 MED ORDER — PROPOFOL 500 MG/50ML IV EMUL
INTRAVENOUS | Status: DC | PRN
  Administered 2019-11-24: 250 ug/kg/min via INTRAVENOUS

## 2019-11-24 MED ORDER — SODIUM CHLORIDE 0.9 % IV SOLN: INTRAVENOUS | Status: DC | PRN

## 2019-11-24 MED ORDER — LACTATED RINGERS IV SOLN: INTRAVENOUS | Status: DC | PRN

## 2019-11-24 MED ORDER — LACTATED RINGERS IV SOLN: INTRAVENOUS | Status: DC

## 2019-11-24 MED ORDER — LIDOCAINE HCL URETHRAL/MUCOSAL 2 % EX GEL
CUTANEOUS | Status: DC | PRN
  Administered 2019-11-24: 1

## 2019-11-24 MED ORDER — SODIUM CHLORIDE 0.9 % IV SOLN: INTRAVENOUS | Status: DC

## 2019-11-24 MED ORDER — PHENYLEPHRINE HCL (PRESSORS) 10 MG/ML IV SOLN
INTRAVENOUS | Status: DC | PRN
  Administered 2019-11-24: 80 ug via INTRAVENOUS
  Administered 2019-11-24: 40 ug via INTRAVENOUS

## 2019-11-24 NOTE — Telephone Encounter (Signed)
Please Advise

## 2019-11-24 NOTE — Op Note (Addendum)
[facility] Patient Name: Crystal Barnes Procedure Date : 11/24/2019 MRN: [id number] Attending MD: Justice Britain , MD Date of Birth: 1950/12/23 CSN: [id number] Age: 69 Admit Type: Outpatient Procedure:                Flexible Sigmoidoscopy Indications:              Personal history of colonic polyps - Traditional                            Serrated Adenoma in Rectum Providers:                Justice Britain, MD, Carlyn Reichert, RN, Lazaro Arms, Technician Referring MD:             Orma Flaming Medicines:                Monitored Anesthesia Care Complications:            No immediate complications. Estimated Blood Loss:     Estimated blood loss was minimal. Procedure:                Pre-Anesthesia Assessment:                           - Prior to the procedure, a History and Physical                            was performed, and patient medications and                            allergies were reviewed. The patient's tolerance of                            previous anesthesia was also reviewed. The risks                            and benefits of the procedure and the sedation                            options and risks were discussed with the patient.                            All questions were answered, and informed consent                            was obtained. Prior Anticoagulants: The patient has                            taken no previous anticoagulant or antiplatelet                            agents. ASA Grade Assessment: III - A patient with  severe systemic disease. After reviewing the risks                            and benefits, the patient was deemed in                            satisfactory condition to undergo the procedure.                           After obtaining informed consent, the scope was                            passed under direct vision. The GIF-H190 ([id number])           Olympus gastroscope was introduced through the anus                            and advanced to the the sigmoid colon. The flexible                            sigmoidoscopy was technically difficult and                            complex. Successful completion of the procedure was                            aided by performing the maneuvers documented                            (below) in this report. The patient tolerated the                            procedure. The quality of the bowel preparation was                            adequate. Scope In: 11:19:55 AM Scope Out: 12:11:03 PM Total Procedure Duration: 0 hours 51 minutes 8 seconds  Findings:      The digital rectal exam findings include hemorrhoids. Pertinent       negatives include no palpable rectal lesions.      A moderate amount of semi-liquid stool was found in the rectum, in the       recto-sigmoid colon and in the sigmoid colon, making visualization       difficult. Lavage of the area was performed using copious amounts,       resulting in clearance with adequate visualization.      The recto-sigmoid colon and sigmoid colon were significantly tortuous.      A 35 mm polyp was found in the mid rectum. The polyp was non-granular       lateral spreading. Preparations were made for mucosal resection. Orise       gel was injected to raise the lesion. Piecemeal mucosal resection using       a snare was performed. Resection and retrieval were complete. To close       the defect after mucosal resection, eight hemostatic clips were       successfully  placed (MR conditional). There was no bleeding at the end       of the procedure.      Non-bleeding non-thrombosed external and internal hemorrhoids were found       during retroflexion, during perianal exam and during digital exam. The       hemorrhoids were Grade II (internal hemorrhoids that prolapse but reduce       spontaneously). Impression:               - Hemorrhoids  found on digital rectal exam.                           - Stool in the rectum, in the recto-sigmoid colon                            and in the sigmoid colon. Lavaged with adequate                            visualization.                           - Tortuous colon.                           - One 35 mm polyp in the mid rectum, removed with                            piecemeal mucosal resection. Resected and                            retrieved. Clips (MR conditional) were placed.                           - Non-bleeding non-thrombosed external and internal                            hemorrhoids. Recommendation:           - The patient will be observed post-procedure,                            until all discharge criteria are met.                           - Discharge patient to home.                           - Patient has a contact number available for                            emergencies. The signs and symptoms of potential                            delayed complications were discussed with the                            patient. Return to normal activities tomorrow.  Written discharge instructions were provided to the                            patient.                           - Observe the patient for one hour.                           - Await pathology results.                           - Repeat flexible sigmoidoscopy in 6 months for                            surveillance after piecemeal resection. Recommend 1                            week of Miralax daily and to stop Fiber/Psyllium                            for that same course. OK to proceed with similar                            preparation otherwise.                           - Monitor for signs/symptoms of bleeding,                            perforation, and infection. If issues please call                            our number to get further assistance as needed.                            - The findings and recommendations were discussed                            with the patient.                           - The findings and recommendations were discussed                            with the patient's family. Procedure Code(s):        --- Professional ---                           [phone], Sigmoidoscopy, flexible; with endoscopic                            mucosal resection Diagnosis Code(s):        --- Professional ---  K64.1, Second degree hemorrhoids                           K62.1, Rectal polyp                           Z86.010, Personal history of colonic polyps                           Q43.8, Other specified congenital malformations of                            intestine CPT copyright 2019 American Medical Association. All rights reserved. The codes documented in this report are preliminary and upon coder review may  be revised to meet current compliance requirements. Justice Britain, MD 11/24/2019 12:38:56 PM Number of Addenda: 0

## 2019-11-24 NOTE — Anesthesia Procedure Notes (Signed)
Procedure Name: MAC Performed by: Lieutenant Diego, CRNA Pre-anesthesia Checklist: Patient identified, Emergency Drugs available, Suction available, Patient being monitored and Timeout performed Patient Re-evaluated:Patient Re-evaluated prior to induction Oxygen Delivery Method: Simple face mask Preoxygenation: Pre-oxygenation with 100% oxygen Induction Type: IV induction

## 2019-11-24 NOTE — Anesthesia Preprocedure Evaluation (Addendum)
Anesthesia Evaluation  Patient identified by MRN, date of birth, ID band Patient awake    Reviewed: Allergy & Precautions, NPO status , Patient's Chart, lab work & pertinent test results  Airway Mallampati: I  TM Distance: >3 FB Neck ROM: Full    Dental  (+) Missing   Pulmonary former smoker,    Pulmonary exam normal breath sounds clear to auscultation       Cardiovascular hypertension, Pt. on medications + CAD, + Past MI and + Cardiac Stents (x 1 in 2014)  Normal cardiovascular exam Rhythm:Regular Rate:Normal     Neuro/Psych negative neurological ROS  negative psych ROS   GI/Hepatic negative GI ROS, Neg liver ROS,   Endo/Other  Hypothyroidism   Renal/GU negative Renal ROS     Musculoskeletal negative musculoskeletal ROS (+)   Abdominal   Peds  Hematology HLD   Anesthesia Other Findings hx of adenomatous colonic polyps, serrated adenoma of colon  Reproductive/Obstetrics                            Anesthesia Physical Anesthesia Plan  ASA: III  Anesthesia Plan: MAC   Post-op Pain Management:    Induction: Intravenous  PONV Risk Score and Plan: 2 and Propofol infusion and Treatment may vary due to age or medical condition  Airway Management Planned: Simple Face Mask  Additional Equipment:   Intra-op Plan:   Post-operative Plan:   Informed Consent: I have reviewed the patients History and Physical, chart, labs and discussed the procedure including the risks, benefits and alternatives for the proposed anesthesia with the patient or authorized representative who has indicated his/her understanding and acceptance.     Dental advisory given  Plan Discussed with: CRNA  Anesthesia Plan Comments:        Anesthesia Quick Evaluation

## 2019-11-24 NOTE — Anesthesia Postprocedure Evaluation (Signed)
Anesthesia Post Note  Patient: Sharel Behne Tetro  Procedure(s) Performed: FLEXIBLE SIGMOIDOSCOPY (N/A ) ENDOSCOPIC MUCOSAL RESECTION (N/A ) SUBMUCOSAL LIFTING INJECTION HEMOSTASIS CLIP PLACEMENT     Patient location during evaluation: PACU Anesthesia Type: MAC Level of consciousness: awake and alert Pain management: pain level controlled Vital Signs Assessment: post-procedure vital signs reviewed and stable Respiratory status: spontaneous breathing, nonlabored ventilation, respiratory function stable and patient connected to nasal cannula oxygen Cardiovascular status: stable and blood pressure returned to baseline Postop Assessment: no apparent nausea or vomiting Anesthetic complications: no    Last Vitals:  Vitals:   11/24/19 1234 11/24/19 1249  BP: 126/76 127/77  Pulse: (!) 57 62  Resp: 13 15  Temp:  36.6 C  SpO2: 100% 98%    Last Pain:  Vitals:   11/24/19 1245  PainSc: 0-No pain                 Safiyyah Vasconez DAVID

## 2019-11-24 NOTE — Discharge Instructions (Signed)

## 2019-11-24 NOTE — Transfer of Care (Signed)
Immediate Anesthesia Transfer of Care Note  Patient: Crystal Barnes  Procedure(s) Performed: FLEXIBLE SIGMOIDOSCOPY (N/A ) ENDOSCOPIC MUCOSAL RESECTION (N/A ) SUBMUCOSAL LIFTING INJECTION HEMOSTASIS CLIP PLACEMENT  Patient Location: PACU  Anesthesia Type:MAC  Level of Consciousness: awake, drowsy and patient cooperative  Airway & Oxygen Therapy: Patient Spontanous Breathing and Patient connected to face mask oxygen  Post-op Assessment: Report given to RN, Post -op Vital signs reviewed and stable and Patient moving all extremities X 4  Post vital signs: Reviewed and stable  Last Vitals:  Vitals Value Taken Time  BP    Temp    Pulse    Resp    SpO2      Last Pain:  Vitals:   11/24/19 0945  PainSc: 0-No pain         Complications: No apparent anesthesia complications

## 2019-11-24 NOTE — H&P (Signed)
GASTROENTEROLOGY PROCEDURE H&P NOTE   Primary Care Physician: Orma Flaming, MD  HPI: Crystal Barnes is a 69 y.o. female who presents for Flex Sigmoidoscopy with EMR.  Past Medical History:  Diagnosis Date  . Arthritis   . CAD (coronary artery disease)   . Cataract   . Hyperlipidemia   . Hypertension   . Hypothyroidism   . Myocardial infarction ([place])   . Thyroid disease    Past Surgical History:  Procedure Laterality Date  . ABDOMINAL HYSTERECTOMY  2008  . CATARACT EXTRACTION Bilateral   . CORONARY STENT PLACEMENT  2014  . TONSILLECTOMY     Current Facility-Administered Medications  Medication Dose Route Frequency Provider Last Rate Last Admin  . lactated ringers infusion   Intravenous Continuous Mansouraty, Telford Nab., MD 20 mL/hr at 11/24/19 0952 New Bag at 11/24/19 [phone]   No Known Allergies Family History  Problem Relation Age of Onset  . Esophageal cancer Father   . Colon cancer Neg Hx   . Rectal cancer Neg Hx   . Stomach cancer Neg Hx   . Inflammatory bowel disease Neg Hx   . Liver disease Neg Hx   . Pancreatic cancer Neg Hx    Social History   Socioeconomic History  . Marital status: Single    Spouse name: Not on file  . Number of children: Not on file  . Years of education: Not on file  . Highest education level: Not on file  Occupational History  . Not on file  Tobacco Use  . Smoking status: Former Smoker    Years: 36.00  . Smokeless tobacco: Never Used  Substance and Sexual Activity  . Alcohol use: Not Currently  . Drug use: Never  . Sexual activity: Not on file  Other Topics Concern  . Not on file  Social History Narrative  . Not on file   Social Determinants of Health   Financial Resource Strain:   . Difficulty of Paying Living Expenses: Not on file  Food Insecurity:   . Worried About Charity fundraiser in the Last Year: Not on file  . Ran Out of Food in the Last Year: Not on file  Transportation Needs:   . Lack of  Transportation (Medical): Not on file  . Lack of Transportation (Non-Medical): Not on file  Physical Activity:   . Days of Exercise per Week: Not on file  . Minutes of Exercise per Session: Not on file  Stress:   . Feeling of Stress : Not on file  Social Connections:   . Frequency of Communication with Friends and Family: Not on file  . Frequency of Social Gatherings with Friends and Family: Not on file  . Attends Religious Services: Not on file  . Active Member of Clubs or Organizations: Not on file  . Attends Archivist Meetings: Not on file  . Marital Status: Not on file  Intimate Partner Violence:   . Fear of Current or Ex-Partner: Not on file  . Emotionally Abused: Not on file  . Physically Abused: Not on file  . Sexually Abused: Not on file    Physical Exam: Vital signs in last 24 hours: Pulse Rate:  [71] 71 (02/15 0945) Resp:  [19] 19 (02/15 0945) BP: (145)/(71) 145/71 (02/15 0945) SpO2:  [98 %] 98 % (02/15 0945)   GEN: NAD EYE: Sclerae anicteric ENT: MMM CV: Non-tachycardic GI: Soft, NT/ND NEURO:  Alert & Oriented x 3  Lab Results: No results for  input(s): WBC, HGB, HCT, PLT in the last 72 hours. BMET No results for input(s): NA, K, CL, CO2, GLUCOSE, BUN, CREATININE, CALCIUM in the last 72 hours. LFT No results for input(s): PROT, ALBUMIN, AST, ALT, ALKPHOS, BILITOT, BILIDIR, IBILI in the last 72 hours. PT/INR No results for input(s): LABPROT, INR in the last 72 hours.   Impression / Plan: This is a 69 y.o.female who presents for who presents for Flex Sigmoidoscopy with EMR.  The risks and benefits of endoscopic evaluation were discussed with the patient; these include but are not limited to the risk of perforation, infection, bleeding, missed lesions, lack of diagnosis, severe illness requiring hospitalization, as well as anesthesia and sedation related illnesses.  The patient is agreeable to proceed.    Justice Britain, MD [place] [facility] Advanced [facility]oscopy Office # [phone]

## 2019-11-25 ENCOUNTER — Encounter: Payer: Self-pay | Admitting: Gastroenterology

## 2019-11-25 LAB — SURGICAL PATHOLOGY

## 2019-12-18 ENCOUNTER — Encounter: Payer: Self-pay | Admitting: Family Medicine

## 2019-12-18 MED ORDER — TRAMADOL HCL 50 MG PO TABS: 100.0000 mg | ORAL_TABLET | Freq: Four times a day (QID) | ORAL | 0 refills | Status: DC

## 2020-01-07 ENCOUNTER — Encounter: Payer: Self-pay | Admitting: Family Medicine

## 2020-01-07 ENCOUNTER — Other Ambulatory Visit: Payer: Self-pay | Admitting: Family Medicine

## 2020-01-07 ENCOUNTER — Other Ambulatory Visit: Payer: Self-pay

## 2020-01-07 NOTE — Telephone Encounter (Signed)
FYI

## 2020-01-13 ENCOUNTER — Other Ambulatory Visit: Payer: Self-pay | Admitting: Family Medicine

## 2020-01-13 NOTE — Telephone Encounter (Signed)
Last refill: 3.11.21 #240, 0 Last OV: 10.19.20 dx. hyperlipidemia

## 2020-01-28 ENCOUNTER — Encounter: Payer: Self-pay | Admitting: Family Medicine

## 2020-01-28 ENCOUNTER — Other Ambulatory Visit: Payer: Self-pay

## 2020-01-28 ENCOUNTER — Ambulatory Visit (INDEPENDENT_AMBULATORY_CARE_PROVIDER_SITE_OTHER): Payer: Medicare Other | Admitting: Family Medicine

## 2020-01-28 VITALS — BP 122/70 | HR 71 | Temp 97.5°F | Ht 63.0 in | Wt 108.8 lb

## 2020-01-28 DIAGNOSIS — G8929 Other chronic pain: Secondary | ICD-10-CM | POA: Diagnosis not present

## 2020-01-28 DIAGNOSIS — E039 Hypothyroidism, unspecified: Secondary | ICD-10-CM

## 2020-01-28 DIAGNOSIS — I10 Essential (primary) hypertension: Secondary | ICD-10-CM | POA: Diagnosis not present

## 2020-01-28 LAB — COMPREHENSIVE METABOLIC PANEL
ALT: 26 U/L (ref 0–35)
AST: 30 U/L (ref 0–37)
Albumin: 4.5 g/dL (ref 3.5–5.2)
Alkaline Phosphatase: 62 U/L (ref 39–117)
BUN: 17 mg/dL (ref 6–23)
CO2: 30 mEq/L (ref 19–32)
Calcium: 9.6 mg/dL (ref 8.4–10.5)
Chloride: 104 mEq/L (ref 96–112)
Creatinine, Ser: 0.62 mg/dL (ref 0.40–1.20)
GFR: 96.16 mL/min (ref >60.00)
Glucose, Bld: 93 mg/dL (ref 70–99)
Potassium: 5.1 mEq/L (ref 3.5–5.1)
Sodium: 138 mEq/L (ref 135–145)
Total Bilirubin: 0.5 mg/dL (ref 0.2–1.2)
Total Protein: 6.3 g/dL (ref 6.0–8.3)

## 2020-01-28 LAB — TSH: TSH: 1.64 u[IU]/mL (ref 0.35–4.50)

## 2020-01-28 NOTE — Progress Notes (Signed)
Patient: Crystal Barnes MRN: [id number] DOB: [birth year] PCP: Orma Flaming, MD     Subjective:  Chief Complaint  Patient presents with  . Hypertension  . Hypothyroidism  . Pain    HPI: The patient is a 69 y.o. female who presents today for routine follow up for HTN, hypothyroid and chronic pain.   Hypertension: Here for follow up of hypertension.  Currently on cozaar 50mg /day. Does not check at home. Takes medication as prescribed and denies any side effects. Exercise includes walking 5x/day. Weight has been stable.  She has lost 5 pounds. Denies any chest pain, headaches, shortness of breath, vision changes, swelling in lower extremities.   Hypothyroidism She is currently on 76mcg daily. Her last thyroid labs were wnl. She has no symptoms.    Chronic pain She is currently on tramadol for chronic pain. She takes 8 pills/day. Drug contract has been signed and she takes as prescribed.    Has had her covid shots.    Review of Systems  Constitutional: Negative for chills, fatigue and fever.  HENT: Negative for dental problem, ear pain, hearing loss and trouble swallowing.   Eyes: Negative for visual disturbance.  Respiratory: Negative for cough, chest tightness and shortness of breath.   Cardiovascular: Negative for chest pain, palpitations and leg swelling.  Gastrointestinal: Negative for abdominal pain, blood in stool, diarrhea and nausea.  Endocrine: Negative for cold intolerance, polydipsia, polyphagia and polyuria.  Genitourinary: Negative for dysuria and hematuria.  Musculoskeletal: Negative for arthralgias.  Skin: Negative for rash.  Neurological: Negative for dizziness and headaches.  Psychiatric/Behavioral: Negative for dysphoric mood and sleep disturbance. The patient is not nervous/anxious.     Allergies Patient has No Known Allergies.  Past Medical History Patient  has a past medical history of Arthritis, CAD (coronary artery disease), Cataract,  Hyperlipidemia, Hypertension, Hypothyroidism, Myocardial infarction [employer]), and Thyroid disease.  Surgical History Patient  has a past surgical history that includes Abdominal hysterectomy (2008); Coronary stent placement (2014); Cataract extraction (Bilateral); Tonsillectomy; Flexible sigmoidoscopy (N/A, 11/24/2019); Endoscopic mucosal resection (N/A, 11/24/2019); Submucosal lifting injection (11/24/2019); and Hemostasis clip placement (11/24/2019).  Family History Pateint's family history includes Esophageal cancer in her father.  Social History Patient  reports that she has quit smoking. She quit after 36.00 years of use. She has never used smokeless tobacco. She reports previous alcohol use. She reports that she does not use drugs.    Objective: Vitals:   01/28/20 1319  BP: 122/70  Pulse: 71  Temp: (!) 97.5 F (36.4 C)  TempSrc: Temporal  SpO2: 98%  Weight: 108 lb 12.8 oz (49.4 kg)  Height: 5\' 3"  (1.6 m)    Body mass index is 19.27 kg/m.  Physical Exam Vitals reviewed.  Constitutional:      Appearance: Normal appearance. She is well-developed and normal weight.  HENT:     Head: Normocephalic and atraumatic.     Right Ear: Tympanic membrane, ear canal and external ear normal.     Left Ear: Tympanic membrane, ear canal and external ear normal.  Eyes:     Extraocular Movements: Extraocular movements intact.     Conjunctiva/sclera: Conjunctivae normal.     Pupils: Pupils are equal, round, and reactive to light.  Neck:     Thyroid: No thyromegaly.     Vascular: No carotid bruit.  Cardiovascular:     Rate and Rhythm: Normal rate and regular rhythm.     Pulses: Normal pulses.     Heart sounds: Normal heart sounds. No  murmur.  Pulmonary:     Effort: Pulmonary effort is normal.     Breath sounds: Normal breath sounds.  Abdominal:     General: Abdomen is flat. Bowel sounds are normal. There is no distension.     Palpations: Abdomen is soft.     Tenderness: There is no  abdominal tenderness.  Musculoskeletal:     Cervical back: Normal range of motion and neck supple.  Lymphadenopathy:     Cervical: No cervical adenopathy.  Skin:    General: Skin is warm and dry.     Capillary Refill: Capillary refill takes less than 2 seconds.     Findings: No rash.  Neurological:     General: No focal deficit present.     Mental Status: She is alert and oriented to person, place, and time.     Cranial Nerves: No cranial nerve deficit.     Coordination: Coordination normal.     Deep Tendon Reflexes: Reflexes normal.  Psychiatric:        Mood and Affect: Mood normal.        Behavior: Behavior normal.      Office Visit from 06/23/2019 in [place]  PHQ-2 Total Score  0         Assessment/plan: 1. Essential hypertension Blood pressure is to goal. Continue current anti-hypertensive medications per hpi. Refills not given and routine lab work will be done today. Recommended routine exercise and healthy diet including DASH diet and mediterranean diet. Encouraged weight loss. F/u in 6 months.  Fasting labs at that time.  - Comprehensive metabolic panel - CBC with Differential/Platelet  2. Hypothyroidism (acquired) Appears euthyroid. Labs today.  - TSH  3. Other chronic pain Drug contract has been signed and pmp reviewed. Takes as prescribed. No refills needed today.   This visit occurred during the SARS-CoV-2 public health emergency.  Safety protocols were in place, including screening questions prior to the visit, additional usage of staff PPE, and extensive cleaning of exam room while observing appropriate contact time as indicated for disinfecting solutions.    Return in about 6 months (around 07/29/2020) for fasting labs and routine follow up .   Orma Flaming, MD [place]   01/28/2020

## 2020-01-29 ENCOUNTER — Encounter: Payer: Self-pay | Admitting: Family Medicine

## 2020-01-29 LAB — CBC WITH DIFFERENTIAL/PLATELET
Basophils Absolute: 0.1 10*3/uL (ref 0.0–0.1)
Basophils Relative: 1.3 % (ref 0.0–3.0)
Eosinophils Absolute: 0.4 10*3/uL (ref 0.0–0.7)
Eosinophils Relative: 6.9 % — ABNORMAL HIGH (ref 0.0–5.0)
HCT: 41.7 % (ref 36.0–46.0)
Hemoglobin: 14.1 g/dL (ref 12.0–15.0)
Lymphocytes Relative: 38.2 % (ref 12.0–46.0)
Lymphs Abs: 2.5 10*3/uL (ref 0.7–4.0)
MCHC: 33.8 g/dL (ref 30.0–36.0)
MCV: 92.3 fl (ref 78.0–100.0)
Monocytes Absolute: 0.6 10*3/uL (ref 0.1–1.0)
Monocytes Relative: 9.3 % (ref 3.0–12.0)
Neutro Abs: 2.9 10*3/uL (ref 1.4–7.7)
Neutrophils Relative %: 44.3 % (ref 43.0–77.0)
Platelets: 263 10*3/uL (ref 150.0–400.0)
RBC: 4.52 Mil/uL (ref 3.87–5.11)
RDW: 13.9 % (ref 11.5–15.5)
WBC: 6.4 10*3/uL (ref 4.0–10.5)

## 2020-01-29 NOTE — Telephone Encounter (Signed)
FYI

## 2020-01-31 ENCOUNTER — Encounter: Payer: Self-pay | Admitting: Family Medicine

## 2020-02-02 NOTE — Telephone Encounter (Signed)
FYI

## 2020-02-09 ENCOUNTER — Other Ambulatory Visit: Payer: Self-pay | Admitting: Family Medicine

## 2020-02-09 NOTE — Telephone Encounter (Signed)
Pt requesting Tramadol 50mg  tab #240 No refills remaining  LOV:01/28/2020  Approve?

## 2020-03-02 ENCOUNTER — Encounter: Payer: Self-pay | Admitting: Family Medicine

## 2020-03-03 ENCOUNTER — Encounter: Payer: Self-pay | Admitting: Family Medicine

## 2020-03-03 DIAGNOSIS — R7303 Prediabetes: Secondary | ICD-10-CM | POA: Insufficient documentation

## 2020-03-08 ENCOUNTER — Other Ambulatory Visit: Payer: Self-pay | Admitting: Family Medicine

## 2020-03-09 ENCOUNTER — Other Ambulatory Visit: Payer: Self-pay

## 2020-03-09 ENCOUNTER — Encounter: Payer: Self-pay | Admitting: Family Medicine

## 2020-03-09 MED ORDER — GABAPENTIN 300 MG PO CAPS: 300.0000 mg | ORAL_CAPSULE | Freq: Four times a day (QID) | ORAL | 11 refills | Status: DC

## 2020-03-10 MED ORDER — TRAMADOL HCL 50 MG PO TABS: ORAL_TABLET | ORAL | 0 refills | Status: DC

## 2020-03-10 NOTE — Telephone Encounter (Signed)
Pt takes 2 tabs 4 times daily.  Please Advise.

## 2020-03-10 NOTE — Telephone Encounter (Signed)
By chart review, it looks like tramadol was refilled on 02/09/2020 for 3 months so she should have it.  Need to clarify with pharmacy; please call and notify pt.

## 2020-03-10 NOTE — Addendum Note (Signed)
Addended by: Billey Chang on: 03/10/2020 11:54 AM   Modules accepted: Orders

## 2020-03-11 ENCOUNTER — Encounter: Payer: Self-pay | Admitting: Family Medicine

## 2020-03-12 NOTE — Telephone Encounter (Signed)
Pt is scheduled for 6-9th for hip xray and tetanus shot, she cut her finger.

## 2020-03-16 ENCOUNTER — Encounter: Payer: Self-pay | Admitting: Family Medicine

## 2020-03-17 ENCOUNTER — Ambulatory Visit: Payer: Medicare Other | Admitting: Family Medicine

## 2020-03-29 ENCOUNTER — Ambulatory Visit (INDEPENDENT_AMBULATORY_CARE_PROVIDER_SITE_OTHER): Payer: Medicare Other

## 2020-03-29 VITALS — Wt 110.0 lb

## 2020-03-29 DIAGNOSIS — Z0000 Encounter for general adult medical examination without abnormal findings: Secondary | ICD-10-CM | POA: Diagnosis not present

## 2020-03-29 NOTE — Patient Instructions (Addendum)
[name] , Thank you for taking time to come for your Medicare Wellness Visit. I appreciate your ongoing commitment to your health goals. Please review the following plan we discussed and let me know if I can assist you in the future.   Screening recommendations/referrals: Colonoscopy: Done:08/19/19 Mammogram: Done:08/08/19  Repeat every year, will call to schedule appt closer to date due   Bone Density: 07/24/19 Up to date  Recommended yearly ophthalmology/optometry visit for glaucoma screening and checkup Recommended yearly dental visit for hygiene and checkup  Vaccinations: Influenza vaccine: Done 06/21/19 Pneumococcal vaccine: 06/25/19 Tdap vaccine: Due Shingles vaccine: Pt sates she completed will bring copy in Covid-19: Moderna 11/11/19 & 12/02/19  Advanced directives: Please bring a copy of your health care power of attorney and living will to the office at your convenience.   Conditions/risks identified: Pt will continue to exercise daily walking dog 5 miles a day.  Next appointment: Follow up in one year for your annual wellness visit    Preventive Care 65 Years and Older, Female Preventive care refers to lifestyle choices and visits with your health care provider that can promote health and wellness. What does preventive care include?  A yearly physical exam. This is also called an annual well check.  Dental exams once or twice a year.  Routine eye exams. Ask your health care provider how often you should have your eyes checked.  Personal lifestyle choices, including:  Daily care of your teeth and gums.  Regular physical activity.  Eating a healthy diet.  Avoiding tobacco and drug use.  Limiting alcohol use.  Practicing safe sex.  Taking low-dose aspirin every day.  Taking vitamin and mineral supplements as recommended by your health care provider. What happens during an annual well check? The services and screenings done by your health care provider  during your annual well check will depend on your age, overall health, lifestyle risk factors, and family history of disease. Counseling  Your health care provider may ask you questions about your:  Alcohol use.  Tobacco use.  Drug use.  Emotional well-being.  Home and relationship well-being.  Sexual activity.  Eating habits.  History of falls.  Memory and ability to understand (cognition).  Work and work Statistician.  Reproductive health. Screening  You may have the following tests or measurements:  Height, weight, and BMI.  Blood pressure.  Lipid and cholesterol levels. These may be checked every 5 years, or more frequently if you are over 39 years old.  Skin check.  Lung cancer screening. You may have this screening every year starting at age 72 if you have a 30-pack-year history of smoking and currently smoke or have quit within the past 15 years.  Fecal occult blood test (FOBT) of the stool. You may have this test every year starting at age 109.  Flexible sigmoidoscopy or colonoscopy. You may have a sigmoidoscopy every 5 years or a colonoscopy every 10 years starting at age 25.  Hepatitis C blood test.  Hepatitis B blood test.  Sexually transmitted disease (STD) testing.  Diabetes screening. This is done by checking your blood sugar (glucose) after you have not eaten for a while (fasting). You may have this done every 1-3 years.  Bone density scan. This is done to screen for osteoporosis. You may have this done starting at age 85.  Mammogram. This may be done every 1-2 years. Talk to your health care provider about how often you should have regular mammograms. Talk with your health  care provider about your test results, treatment options, and if necessary, the need for more tests. Vaccines  Your health care provider may recommend certain vaccines, such as:  Influenza vaccine. This is recommended every year.  Tetanus, diphtheria, and acellular pertussis  (Tdap, Td) vaccine. You may need a Td booster every 10 years.  Zoster vaccine. You may need this after age 42.  Pneumococcal 13-valent conjugate (PCV13) vaccine. One dose is recommended after age 47.  Pneumococcal polysaccharide (PPSV23) vaccine. One dose is recommended after age 20. Talk to your health care provider about which screenings and vaccines you need and how often you need them. This information is not intended to replace advice given to you by your health care provider. Make sure you discuss any questions you have with your health care provider. Document Released: 10/22/2015 Document Revised: 06/14/2016 Document Reviewed: 07/27/2015 Elsevier Interactive Patient Education  2017 [employer] Prevention in the Home Falls can cause injuries. They can happen to people of all ages. There are many things you can do to make your home safe and to help prevent falls. What can I do on the outside of my home?  Regularly fix the edges of walkways and driveways and fix any cracks.  Remove anything that might make you trip as you walk through a door, such as a raised step or threshold.  Trim any bushes or trees on the path to your home.  Use bright outdoor lighting.  Clear any walking paths of anything that might make someone trip, such as rocks or tools.  Regularly check to see if handrails are loose or broken. Make sure that both sides of any steps have handrails.  Any raised decks and porches should have guardrails on the edges.  Have any leaves, snow, or ice cleared regularly.  Use sand or salt on walking paths during winter.  Clean up any spills in your garage right away. This includes oil or grease spills. What can I do in the bathroom?  Use night lights.  Install grab bars by the toilet and in the tub and shower. Do not use towel bars as grab bars.  Use non-skid mats or decals in the tub or shower.  If you need to sit down in the shower, use a plastic, non-slip  stool.  Keep the floor dry. Clean up any water that spills on the floor as soon as it happens.  Remove soap buildup in the tub or shower regularly.  Attach bath mats securely with double-sided non-slip rug tape.  Do not have throw rugs and other things on the floor that can make you trip. What can I do in the bedroom?  Use night lights.  Make sure that you have a light by your bed that is easy to reach.  Do not use any sheets or blankets that are too big for your bed. They should not hang down onto the floor.  Have a firm chair that has side arms. You can use this for support while you get dressed.  Do not have throw rugs and other things on the floor that can make you trip. What can I do in the kitchen?  Clean up any spills right away.  Avoid walking on wet floors.  Keep items that you use a lot in easy-to-reach places.  If you need to reach something above you, use a strong step stool that has a grab bar.  Keep electrical cords out of the way.  Do not use floor  polish or wax that makes floors slippery. If you must use wax, use non-skid floor wax.  Do not have throw rugs and other things on the floor that can make you trip. What can I do with my stairs?  Do not leave any items on the stairs.  Make sure that there are handrails on both sides of the stairs and use them. Fix handrails that are broken or loose. Make sure that handrails are as long as the stairways.  Check any carpeting to make sure that it is firmly attached to the stairs. Fix any carpet that is loose or worn.  Avoid having throw rugs at the top or bottom of the stairs. If you do have throw rugs, attach them to the floor with carpet tape.  Make sure that you have a light switch at the top of the stairs and the bottom of the stairs. If you do not have them, ask someone to add them for you. What else can I do to help prevent falls?  Wear shoes that:  Do not have high heels.  Have rubber bottoms.  Are  comfortable and fit you well.  Are closed at the toe. Do not wear sandals.  If you use a stepladder:  Make sure that it is fully opened. Do not climb a closed stepladder.  Make sure that both sides of the stepladder are locked into place.  Ask someone to hold it for you, if possible.  Clearly mark and make sure that you can see:  Any grab bars or handrails.  First and last steps.  Where the edge of each step is.  Use tools that help you move around (mobility aids) if they are needed. These include:  Canes.  Walkers.  Scooters.  Crutches.  Turn on the lights when you go into a dark area. Replace any light bulbs as soon as they burn out.  Set up your furniture so you have a clear path. Avoid moving your furniture around.  If any of your floors are uneven, fix them.  If there are any pets around you, be aware of where they are.  Review your medicines with your doctor. Some medicines can make you feel dizzy. This can increase your chance of falling. Ask your doctor what other things that you can do to help prevent falls. This information is not intended to replace advice given to you by your health care provider. Make sure you discuss any questions you have with your health care provider. Document Released: 07/22/2009 Document Revised: 03/02/2016 Document Reviewed: 10/30/2014 Elsevier Interactive Patient Education  2017 [employer].

## 2020-03-29 NOTE — Progress Notes (Signed)
Subjective:   Crystal Barnes is a 69 y.o. female who presents for an Initial Medicare Annual Wellness Visit.  Virtual Visit via Telephone Note  I connected with  Crystal Barnes on 03/29/20 at  2:45 PM EDT by telephone and verified that I am speaking with the correct person using two identifiers.  Medicare Annual Wellness visit completed telephonically due to Covid-19 pandemic.   Location: Patient: Home Provider: Office   I discussed the limitations, risks, security and privacy concerns of performing an evaluation and management service by telephone and the availability of in person appointments. The patient expressed understanding and agreed to proceed.  Unable to perform video visit due to video visit attempted and failed and/or patient does not have video capability.   Some vital signs may be absent or patient reported.   Willette Brace, LPN     Cardiac Risk Factors include: advanced age (>74men, >60 women);dyslipidemia      Objective:    Today's Vitals   03/29/20 1437  Weight: 110 lb (49.9 kg)   Body mass index is 19.49 kg/m.  Advanced Directives 03/29/2020 11/24/2019  Does Patient Have a Medical Advance Directive? Yes Yes  Type of Paramedic of [place];Living will Living will  Copy of [place] in Chart? No - copy requested -    Current Medications (verified) Outpatient Encounter Medications as of 03/29/2020  Medication Sig  . aspirin EC 81 MG tablet Take 81 mg by mouth 2 (two) times daily.  . Calcium-Magnesium-Vitamin D (CALCIUM 1200+D3 PO) Take 1 tablet by mouth daily.  [name] gabapentin (NEURONTIN) 300 MG capsule Take 1 capsule (300 mg total) by mouth 4 (four) times daily.  [name] levothyroxine (SYNTHROID) 88 MCG tablet TAKE 1 TABLET BY MOUTH  DAILY BEFORE BREAKFAST  . losartan (COZAAR) 50 MG tablet TAKE 1 TABLET BY MOUTH  DAILY  . Multiple Vitamin (MULTIVITAMIN WITH MINERALS) TABS tablet Take 1 tablet by mouth daily.   . Omega-3 Fatty Acids (FISH OIL) 1360 MG CAPS Take 1,360 mg by mouth at bedtime.  Vladimir Faster Glycol-Propyl Glycol (SYSTANE OP) Place 1 drop into both eyes 4 (four) times daily.  . rosuvastatin (CRESTOR) 40 MG tablet Take 1 tablet (40 mg total) by mouth at bedtime.  . traMADol (ULTRAM) 50 MG tablet TAKE 2 TABLETS BY MOUTH 4  TIMES DAILY  . Turmeric 500 MG CAPS Take 500 mg by mouth at bedtime.   Facility-Administered Encounter Medications as of 03/29/2020  Medication  . 0.9 %  sodium chloride infusion    Allergies (verified) Patient has no known allergies.   History: Past Medical History:  Diagnosis Date  . Arthritis   . CAD (coronary artery disease)   . Cataract   . Hyperlipidemia   . Hypertension   . Hypothyroidism   . Myocardial infarction ([place])   . Thyroid disease    Past Surgical History:  Procedure Laterality Date  . ABDOMINAL HYSTERECTOMY  2008  . CATARACT EXTRACTION Bilateral   . CORONARY STENT PLACEMENT  2014  . ENDOSCOPIC MUCOSAL RESECTION N/A 11/24/2019   Procedure: ENDOSCOPIC MUCOSAL RESECTION;  Surgeon: Rush Landmark Telford Nab., MD;  Location: [facility];  Service: Gastroenterology;  Laterality: N/A;  . FLEXIBLE SIGMOIDOSCOPY N/A 11/24/2019   Procedure: FLEXIBLE SIGMOIDOSCOPY;  Surgeon: Rush Landmark Telford Nab., MD;  Location: [facility];  Service: Gastroenterology;  Laterality: N/A;  . HEMOSTASIS CLIP PLACEMENT  11/24/2019   Procedure: HEMOSTASIS CLIP PLACEMENT;  Surgeon: Rush Landmark Telford Nab., MD;  Location: [facility]  ENDOSCOPY;  Service: Gastroenterology;;  . Lia Foyer LIFTING INJECTION  11/24/2019   Procedure: SUBMUCOSAL LIFTING INJECTION;  Surgeon: Irving Copas., MD;  Location: [facility] ENDOSCOPY;  Service: Gastroenterology;;  . TONSILLECTOMY     Family History  Problem Relation Age of Onset  . Esophageal cancer Father   . Colon cancer Neg Hx   . Rectal cancer Neg Hx   . Stomach cancer Neg Hx   . Inflammatory bowel disease Neg Hx   . Liver disease Neg  Hx   . Pancreatic cancer Neg Hx    Social History   Socioeconomic History  . Marital status: Single    Spouse name: Not on file  . Number of children: Not on file  . Years of education: Not on file  . Highest education level: Not on file  Occupational History  . Occupation: Retired     Comment: about a year and half ago  Tobacco Use  . Smoking status: Former Smoker    Years: 36.00  . Smokeless tobacco: Never Used  Vaping Use  . Vaping Use: Never used  Substance and Sexual Activity  . Alcohol use: Not Currently  . Drug use: Never  . Sexual activity: Not Currently  Other Topics Concern  . Not on file  Social History Narrative  . Not on file   Social Determinants of Health   Financial Resource Strain: Low Risk   . Difficulty of Paying Living Expenses: Not hard at all  Food Insecurity: No Food Insecurity  . Worried About Charity fundraiser in the Last Year: Never true  . Ran Out of Food in the Last Year: Never true  Transportation Needs: No Transportation Needs  . Lack of Transportation (Medical): No  . Lack of Transportation (Non-Medical): No  Physical Activity: Sufficiently Active  . Days of Exercise per Week: 5 days  . Minutes of Exercise per Session: 60 min  Stress: No Stress Concern Present  . Feeling of Stress : Not at all  Social Connections: Moderately Isolated  . Frequency of Communication with Friends and Family: More than three times a week  . Frequency of Social Gatherings with Friends and Family: Three times a week  . Attends Religious Services: Never  . Active Member of Clubs or Organizations: Yes  . Attends Archivist Meetings: 1 to 4 times per year  . Marital Status: Never married    Tobacco Counseling Counseling given: Not Answered   Clinical Intake:  Pre-visit preparation completed: Yes  Pain : No/denies pain     BMI - recorded: 19.49 Nutritional Risks: None Diabetes: No  How often do you need to have someone help you when  you read instructions, pamphlets, or other written materials from your doctor or pharmacy?: 1 - Never    Interpreter Needed?: No  Information entered by :: Otila Kluver Dung Prien,LPN   Activities of Daily Living In your present state of health, do you have any difficulty performing the following activities: 03/29/2020 01/28/2020  Hearing? N N  Vision? N N  Difficulty concentrating or making decisions? N N  Walking or climbing stairs? N N  Dressing or bathing? N N  Doing errands, shopping? N N  Preparing Food and eating ? N -  Using the Toilet? N -  In the past six months, have you accidently leaked urine? Y -  Comment urgency and hard timemaking it to the toliet at times -  Do you have problems with loss of bowel control? N -  Managing  your Medications? N -  Managing your Finances? N -  Housekeeping or managing your Housekeeping? N -  Some recent data might be hidden    Patient Care Team: Orma Flaming, MD as PCP - General (Family Medicine)  Indicate any recent Medical Services you may have received from other than [facility] providers in the past year (date may be approximate).     Assessment:   This is a routine wellness examination for Jamelle.  Hearing/Vision screen  Hearing Screening   125Hz  250Hz  500Hz  1000Hz  2000Hz  3000Hz  4000Hz  6000Hz  8000Hz   Right ear:           Left ear:           Comments: Pt denies any difficulty hearing   Vision Screening Comments: Pt has had lasix surgery, still follows up annual eye exams  Dietary issues and exercise activities discussed: Current Exercise Habits: Home exercise routine, Type of exercise: walking, Time (Minutes): 60, Frequency (Times/Week): 5, Weekly Exercise (Minutes/Week): 300, Intensity: Mild  Goals    . Patient Stated     Continue to exercise doing 5 miles a day walking dog daily      Depression Screen PHQ 2/9 Scores 03/29/2020 06/23/2019 02/10/2019  PHQ - 2 Score 0 0 0  PHQ- 9 Score - - 2    Fall Risk Fall Risk  03/29/2020  06/23/2019 02/10/2019  Falls in the past year? 0 0 0  Number falls in past yr: 0 0 0  Injury with Fall? 1 - 0  Comment fell and scratched knee walking dog - -  Risk for fall due to : No Fall Risks - -  Follow up Falls prevention discussed Falls evaluation completed -    Any stairs in or around the home? Yes  If so, are there any without handrails? Yes  Home free of loose throw rugs in walkways, pet beds, electrical cords, etc? Yes  Adequate lighting in your home to reduce risk of falls? Yes   ASSISTIVE DEVICES UTILIZED TO PREVENT FALLS:  Life alert? No  Use of a cane, walker or w/c? No  Grab bars in the bathroom? No  Shower chair or bench in shower? No  Elevated toilet seat or a handicapped toilet? No   TIMED UP AND GO:  Was the test performed? No .   Cognitive Function:     6CIT Screen 03/29/2020  What Year? 0 points  What month? 0 points  What time? 0 points  Count back from 20 0 points  Months in reverse 0 points  Repeat phrase 0 points  Total Score 0    Immunizations Immunization History  Administered Date(s) Administered  . Influenza, High Dose Seasonal PF 06/21/2019  . Influenza-Unspecified 08/08/2018, 06/21/2019  . Moderna SARS-COVID-2 Vaccination 11/11/2019, 12/02/2019  . Pneumococcal Polysaccharide-23 06/25/2019   Tdap: Due, Education given with verbal understanding  Flu Vaccine status: Up to date   Pneumococcal vaccine status: Up to date   Covid-19 vaccine status: Completed vaccines   Shingrix Completed?: Yes Pt will bring in copy of immunization at next appt  Screening Tests Health Maintenance  Topic Date Due  . TETANUS/TDAP  Never done  . INFLUENZA VACCINE  05/09/2020  . PNA vac Low Risk Adult (2 of 2 - PCV13) 06/24/2020  . MAMMOGRAM  08/07/2021  . DEXA SCAN  07/23/2022  . COLONOSCOPY  08/18/2029  . COVID-19 Vaccine  Completed  . Hepatitis C Screening  Completed    Health Maintenance  Health Maintenance Due  Topic Date  Due  .  TETANUS/TDAP  Never done   Cancer Screenings:  Colorectal Screening: Completed; As directed by GI   Mammogram: Completed 08/08/19;  Repeat every year will call to schedule appt closer to date  Bone Density: Completed 07/24/19 Results reflect OSTEOPENIA  Repeat every 2 years.      Hepatitis C Screening: Completed  Vision Screening: Recommended annual ophthalmology exams for early detection of glaucoma and other disorders of the eye. Is the patient up to date with their annual eye exam?  Yes  Who is the provider or what is the name of the office in which the patient attends annual eye exams? Dr Posey Pronto If pt is not established with a provider, would they like to be referred to a provider to establish care? Yes .   Dental Screening: Recommended annual dental exams for proper oral hygiene  Community Resource Referral / Chronic Care Management: CRR required this visit?  No   CCM required this visit?  No      Plan:     I have personally reviewed and noted the following in the patient's chart:   . Medical and social history . Use of alcohol, tobacco or illicit drugs  . Current medications and supplements . Functional ability and status . Nutritional status . Physical activity . Advanced directives . List of other physicians . Hospitalizations, surgeries, and ER visits in previous 12 months . Vitals . Screenings to include cognitive, depression, and falls . Referrals and appointments  In addition, I have reviewed and discussed with patient certain preventive protocols, quality metrics, and best practice recommendations. A written personalized care plan for preventive services as well as general preventive health recommendations were provided to patient.     Willette Brace, LPN   0/27/2536   Nurse Notes: Pt stated she had been having hip pain, not enough to come in for appointment, states it is relieved with over the counter medications at this time.

## 2020-04-05 ENCOUNTER — Telehealth: Payer: Self-pay

## 2020-04-05 NOTE — Telephone Encounter (Signed)
Dr Rush Landmark is out of the office for the next few weeks and appt had to be cancelled on 6/30.  He ok'd pt to have colon at the hospital without appt.  Left message on machine to call back

## 2020-04-07 ENCOUNTER — Ambulatory Visit: Payer: Medicare Other | Admitting: Gastroenterology

## 2020-04-09 NOTE — Telephone Encounter (Signed)
The pt has been called to set up colon however she is not sure she needs to have colon.  An appt has been scheduled for her to see Dr Rush Landmark in August.  Pt will keep that appt.

## 2020-04-15 ENCOUNTER — Other Ambulatory Visit: Payer: Self-pay | Admitting: Family Medicine

## 2020-04-15 NOTE — Telephone Encounter (Signed)
Pt requesting refill on Tramadol.  

## 2020-05-11 ENCOUNTER — Other Ambulatory Visit: Payer: Self-pay | Admitting: Family Medicine

## 2020-05-12 ENCOUNTER — Other Ambulatory Visit: Payer: Self-pay

## 2020-05-12 ENCOUNTER — Encounter: Payer: Self-pay | Admitting: Family Medicine

## 2020-05-14 MED ORDER — TRAMADOL HCL 50 MG PO TABS: ORAL_TABLET | ORAL | 0 refills | Status: DC

## 2020-05-14 NOTE — Telephone Encounter (Signed)
Pt is requesting Tramadol 50mg  tab  LOV: 01/28/2020 Future Visits: 05/26/2020 Last refill 04/15/2020  Approve?

## 2020-05-19 ENCOUNTER — Other Ambulatory Visit: Payer: Self-pay | Admitting: Family Medicine

## 2020-05-19 ENCOUNTER — Encounter: Payer: Self-pay | Admitting: Family Medicine

## 2020-05-19 DIAGNOSIS — Z1231 Encounter for screening mammogram for malignant neoplasm of breast: Secondary | ICD-10-CM

## 2020-05-24 ENCOUNTER — Other Ambulatory Visit: Payer: Self-pay | Admitting: Family Medicine

## 2020-05-24 DIAGNOSIS — R7303 Prediabetes: Secondary | ICD-10-CM

## 2020-05-24 DIAGNOSIS — E782 Mixed hyperlipidemia: Secondary | ICD-10-CM

## 2020-05-24 DIAGNOSIS — I10 Essential (primary) hypertension: Secondary | ICD-10-CM

## 2020-05-24 DIAGNOSIS — E039 Hypothyroidism, unspecified: Secondary | ICD-10-CM

## 2020-05-26 ENCOUNTER — Ambulatory Visit: Payer: Medicare Other | Admitting: Family Medicine

## 2020-06-04 ENCOUNTER — Ambulatory Visit: Payer: Medicare Other | Admitting: Gastroenterology

## 2020-06-04 ENCOUNTER — Encounter: Payer: Self-pay | Admitting: Gastroenterology

## 2020-06-04 VITALS — BP 120/70 | HR 77 | Ht 62.0 in | Wt 110.2 lb

## 2020-06-04 DIAGNOSIS — Z9889 Other specified postprocedural states: Secondary | ICD-10-CM

## 2020-06-04 DIAGNOSIS — D128 Benign neoplasm of rectum: Secondary | ICD-10-CM

## 2020-06-04 DIAGNOSIS — Z8719 Personal history of other diseases of the digestive system: Secondary | ICD-10-CM | POA: Diagnosis not present

## 2020-06-04 DIAGNOSIS — Z86010 Personal history of colonic polyps: Secondary | ICD-10-CM | POA: Diagnosis not present

## 2020-06-04 NOTE — Patient Instructions (Signed)
You have been scheduled for an endoscopy. Please follow written instructions given to you at your visit today. If you use inhalers (even only as needed), please bring them with you on the day of your procedure.   START: Start Miralax (1) capful daily for one week before your procedure.   Labs will be done at PCP office. Please have PCP to send copy of labs.  If you are age 67 or older, your body mass index should be between 23-30. Your Body mass index is 20.16 kg/m. If this is out of the aforementioned range listed, please consider follow up with your Primary Care Provider.  If you are age 30 or younger, your body mass index should be between 19-25. Your Body mass index is 20.16 kg/m. If this is out of the aformentioned range listed, please consider follow up with your Primary Care Provider.    Due to recent changes in healthcare laws, you may see the results of your imaging and laboratory studies on MyChart before your provider has had a chance to review them.  We understand that in some cases there may be results that are confusing or concerning to you. Not all laboratory results come back in the same time frame and the provider may be waiting for multiple results in order to interpret others.  Please give [place] 48 hours in order for your provider to thoroughly review all the results before contacting the office for clarification of your results.   Thank you for choosing me and [facility].  Dr. Rush Landmark

## 2020-06-06 ENCOUNTER — Encounter: Payer: Self-pay | Admitting: Gastroenterology

## 2020-06-06 DIAGNOSIS — Z8719 Personal history of other diseases of the digestive system: Secondary | ICD-10-CM | POA: Insufficient documentation

## 2020-06-06 DIAGNOSIS — D126 Benign neoplasm of colon, unspecified: Secondary | ICD-10-CM | POA: Insufficient documentation

## 2020-06-06 DIAGNOSIS — Z86010 Personal history of colonic polyps: Secondary | ICD-10-CM | POA: Insufficient documentation

## 2020-06-06 NOTE — Progress Notes (Signed)
[place] VISIT   Primary Care Provider Orma Flaming, [place] [address] [phone]  Patient Profile: Crystal Barnes is a 69 y.o. female with a pmh significant for hypertension, hyperlipidemia, CAD, arthritis, thyroid disease, cataracts, colon polyps.  The patient presents to the [facility] for an evaluation and management of problem(s) noted below:  Problem List 1. History of rectal polypectomy   2. Tubular adenoma polyp of rectum   3. Hx of adenomatous colonic polyps     History of Present Illness Please see initial consultation note for full details of HPI.  Interval History The patient presents for follow-up.  She has been doing well since her flex sig with EMR.  She is due for follow-up.  She is still taking stool softeners.  Otherwise is doing well.  No blood in her stools.  GI Review of Systems Positive as above Negative for odynophagia, dysphagia, nausea, vomiting, pain, melena, hematochezia, change in bowel habits  Review of Systems General: Denies fevers/chills/weight loss unintentionally Cardiovascular: Denies chest pain Pulmonary: Denies shortness of breath Gastroenterological: See HPI Genitourinary: Denies darkened urine Hematological: Denies easy bruising/bleeding Dermatological: Denies jaundice Psychological: Mood is stable   Medications Current Outpatient Medications  Medication Sig Dispense Refill  . aspirin EC 81 MG tablet Take 81 mg by mouth 2 (two) times daily.    . Calcium-Magnesium-Vitamin D (CALCIUM 1200+D3 PO) Take 1 tablet by mouth daily.    Mariane Baumgarten Calcium (STOOL SOFTENER PO) Take 1 tablet by mouth daily.    [name] gabapentin (NEURONTIN) 300 MG capsule Take 1 capsule (300 mg total) by mouth 4 (four) times daily. 120 capsule 11  . levothyroxine (SYNTHROID) 88 MCG tablet TAKE 1 TABLET BY MOUTH  DAILY BEFORE BREAKFAST 90 tablet 3  . losartan (COZAAR) 50 MG tablet TAKE 1  TABLET BY MOUTH  DAILY 90 tablet 3  . Multiple Vitamin (MULTIVITAMIN WITH MINERALS) TABS tablet Take 1 tablet by mouth daily.    . Omega-3 Fatty Acids (FISH OIL) 1360 MG CAPS Take 1,360 mg by mouth at bedtime.    Vladimir Faster Glycol-Propyl Glycol (SYSTANE OP) Place 1 drop into both eyes 4 (four) times daily.    . rosuvastatin (CRESTOR) 40 MG tablet Take 1 tablet (40 mg total) by mouth at bedtime. 90 tablet 3  . traMADol (ULTRAM) 50 MG tablet Take 2 tablets by mouth 4 times daily 240 tablet 0  . Turmeric 500 MG CAPS Take 500 mg by mouth at bedtime.     Current Facility-Administered Medications  Medication Dose Route Frequency Provider Last Rate Last Admin  . 0.9 %  sodium chloride infusion  500 mL Intravenous Once Mansouraty, Telford Nab., MD        Allergies No Known Allergies  Histories Past Medical History:  Diagnosis Date  . Arthritis   . CAD (coronary artery disease)   . Cataract   . Hyperlipidemia   . Hypertension   . Hypothyroidism   . Myocardial infarction ([place])   . Thyroid disease    Past Surgical History:  Procedure Laterality Date  . ABDOMINAL HYSTERECTOMY  2008  . CATARACT EXTRACTION Bilateral   . CORONARY STENT PLACEMENT  2014  . ENDOSCOPIC MUCOSAL RESECTION N/A 11/24/2019   Procedure: ENDOSCOPIC MUCOSAL RESECTION;  Surgeon: Rush Landmark Telford Nab., MD;  Location: [facility];  Service: Gastroenterology;  Laterality: N/A;  . FLEXIBLE SIGMOIDOSCOPY N/A 11/24/2019   Procedure: FLEXIBLE SIGMOIDOSCOPY;  Surgeon: Irving Copas., MD;  Location: [facility];  Service: Gastroenterology;  Laterality: N/A;  . HEMOSTASIS CLIP PLACEMENT  11/24/2019   Procedure: HEMOSTASIS CLIP PLACEMENT;  Surgeon: Irving Copas., MD;  Location: [facility];  Service: Gastroenterology;;  . Lia Foyer LIFTING INJECTION  11/24/2019   Procedure: SUBMUCOSAL LIFTING INJECTION;  Surgeon: Irving Copas., MD;  Location: [facility];  Service: Gastroenterology;;  .  TONSILLECTOMY     Social History   Socioeconomic History  . Marital status: Single    Spouse name: Not on file  . Number of children: Not on file  . Years of education: Not on file  . Highest education level: Not on file  Occupational History  . Occupation: Retired     Comment: about a year and half ago  Tobacco Use  . Smoking status: Former Smoker    Years: 36.00    Types: Cigarettes  . Smokeless tobacco: Never Used  Vaping Use  . Vaping Use: Never used  Substance and Sexual Activity  . Alcohol use: Not Currently  . Drug use: Never  . Sexual activity: Not Currently  Other Topics Concern  . Not on file  Social History Narrative  . Not on file   Social Determinants of Health   Financial Resource Strain: Low Risk   . Difficulty of Paying Living Expenses: Not hard at all  Food Insecurity: No Food Insecurity  . Worried About Charity fundraiser in the Last Year: Never true  . Ran Out of Food in the Last Year: Never true  Transportation Needs: No Transportation Needs  . Lack of Transportation (Medical): No  . Lack of Transportation (Non-Medical): No  Physical Activity: Sufficiently Active  . Days of Exercise per Week: 5 days  . Minutes of Exercise per Session: 60 min  Stress: No Stress Concern Present  . Feeling of Stress : Not at all  Social Connections: Moderately Isolated  . Frequency of Communication with Friends and Family: More than three times a week  . Frequency of Social Gatherings with Friends and Family: Three times a week  . Attends Religious Services: Never  . Active Member of Clubs or Organizations: Yes  . Attends Archivist Meetings: 1 to 4 times per year  . Marital Status: Never married  Intimate Partner Violence: Not At Risk  . Fear of Current or Ex-Partner: No  . Emotionally Abused: No  . Physically Abused: No  . Sexually Abused: No   Family History  Problem Relation Age of Onset  . Esophageal cancer Father   . Colon cancer Neg Hx     . Rectal cancer Neg Hx   . Stomach cancer Neg Hx   . Inflammatory bowel disease Neg Hx   . Liver disease Neg Hx   . Pancreatic cancer Neg Hx    I have reviewed her medical, social, and family history in detail and updated the electronic medical record as necessary.    PHYSICAL EXAMINATION  BP 120/70 (BP Location: Left Arm, Patient Position: Sitting, Cuff Size: Normal)   Pulse 77   Ht 5\' 2"  (1.575 m)   Wt 110 lb 4 oz (50 kg)   LMP  (LMP Unknown)   SpO2 98%   BMI 20.16 kg/m  Wt Readings from Last 3 Encounters:  06/04/20 110 lb 4 oz (50 kg)  03/29/20 110 lb (49.9 kg)  01/28/20 108 lb 12.8 oz (49.4 kg)  GEN: NAD, appears stated age, doesn't appear chronically ill PSYCH: Cooperative, without pressured speech EYE: Conjunctivae pink, sclerae anicteric ENT: MMM CV:  Nontachycardic RESP: No audible wheezing GI: NABS, soft, NT/ND, without rebound or guarding MSK/EXT: No lower extremity edema SKIN: No jaundice NEURO:  Alert & Oriented x 3, no focal deficits   REVIEW OF DATA  I reviewed the following data at the time of this encounter:  GI Procedures and Studies  February 2021 flexible sigmoidoscopy - Hemorrhoids found on digital rectal exam. - Stool in the rectum, in the recto-sigmoid colon and in the sigmoid colon. Lavaged with adequate visualization. - Tortuous colon. - One 35 mm polyp in the mid rectum, removed with piecemeal mucosal resection.  Resected and retrieved. Clips (MR conditional) were placed. - Non-bleeding non-thrombosed external and internal hemorrhoids. Pathology FINAL MICROSCOPIC DIAGNOSIS:  A. RECTAL, BIOPSY (EMR):  - Tubular adenoma(s)  - Negative for high-grade dysplasia or malignancy   Laboratory Studies  Reviewed those in epic  Imaging Studies  No relevant studies to review   ASSESSMENT  Ms. Rosenau is a 69 y.o. female  with a pmh significant for hypertension, hyperlipidemia, CAD, arthritis, thyroid disease, cataracts, colon polyps.  The patient  is seen today for evaluation and management of:  1. History of rectal polypectomy   2. Tubular adenoma polyp of rectum   3. Hx of adenomatous colonic polyps    The patient is hemodynamically and clinically stable.  She has done well since her flexible sigmoidoscopy with piecemeal mucosal resection of a rectal adenoma.  Initially her biopsies that suggested a traditional serrated adenoma but final tissue from EMR showed this to be tubular adenoma.  Our plan is for a follow-up flex sig with EMR slot in case she has any additional tissue present such that we will require additional work.  We discussed some of the techniques of advanced polypectomy which include Endoscopic Mucosal Resection, OVESCO Full-Thickness Resection, Endorotor Morcellation, and Tissue Ablation via Fulguration.  The risks and benefits of endoscopic evaluation were discussed with the patient; these include but are not limited to the risk of perforation, infection, bleeding, missed lesions, lack of diagnosis, severe illness requiring hospitalization, as well as anesthesia and sedation related illnesses.  All patient questions were answered, to the best of my ability, and the patient agrees to the aforementioned plan of action with follow-up as indicated.   PLAN  Preprocedure labs to be obtained in coming weeks Proceed with scheduling flex sigmoidoscopy with EMR follow-up   Orders Placed This Encounter  Procedures  . Procedural/ Surgical Case Request: FLEXIBLE SIGMOIDOSCOPY  . Ambulatory referral to Gastroenterology    New Prescriptions   No medications on file   Modified Medications   No medications on file    Planned Follow Up No follow-ups on file.   Total Time in Face-to-Face and in Coordination of Care for patient including independent/personal interpretation/review of prior testing, medical history, examination, medication adjustment, communicating results with the patient directly, and documentation with the EHR  is 20 minutes.   Justice Britain, MD [facility] Advanced Endoscopy Office # [phone]

## 2020-06-10 ENCOUNTER — Encounter: Payer: Self-pay | Admitting: Family Medicine

## 2020-06-10 ENCOUNTER — Other Ambulatory Visit: Payer: Self-pay | Admitting: Family Medicine

## 2020-06-10 MED ORDER — TRAMADOL HCL 50 MG PO TABS
ORAL_TABLET | ORAL | 0 refills | Status: DC
Start: 2020-06-10 — End: 2020-07-08

## 2020-06-10 NOTE — Telephone Encounter (Signed)
LR: 05-14-2020 Qty: 240 with 0 refills Last office visit: 01-28-2020 Upcoming appointment: 07-28-2020

## 2020-07-08 ENCOUNTER — Other Ambulatory Visit: Payer: Self-pay | Admitting: Family Medicine

## 2020-07-23 ENCOUNTER — Other Ambulatory Visit: Payer: Self-pay

## 2020-07-23 ENCOUNTER — Other Ambulatory Visit: Payer: Medicare Other

## 2020-07-23 DIAGNOSIS — E782 Mixed hyperlipidemia: Secondary | ICD-10-CM

## 2020-07-23 DIAGNOSIS — I10 Essential (primary) hypertension: Secondary | ICD-10-CM

## 2020-07-23 DIAGNOSIS — E039 Hypothyroidism, unspecified: Secondary | ICD-10-CM

## 2020-07-23 DIAGNOSIS — R7303 Prediabetes: Secondary | ICD-10-CM

## 2020-07-24 LAB — COMPREHENSIVE METABOLIC PANEL
AG Ratio: 2.7 (calc) — ABNORMAL HIGH (ref 1.0–2.5)
ALT: 27 U/L (ref 6–29)
AST: 30 U/L (ref 10–35)
Albumin: 4.3 g/dL (ref 3.6–5.1)
Alkaline phosphatase (APISO): 57 U/L (ref 37–153)
BUN: 18 mg/dL (ref 7–25)
CO2: 27 mmol/L (ref 20–32)
Calcium: 9.5 mg/dL (ref 8.6–10.4)
Chloride: 105 mmol/L (ref 98–110)
Creat: 0.57 mg/dL (ref 0.50–0.99)
Globulin: 1.6 g/dL (calc) — ABNORMAL LOW (ref 1.9–3.7)
Glucose, Bld: 91 mg/dL (ref 65–99)
Potassium: 5.4 mmol/L — ABNORMAL HIGH (ref 3.5–5.3)
Sodium: 135 mmol/L (ref 135–146)
Total Bilirubin: 0.5 mg/dL (ref 0.2–1.2)
Total Protein: 5.9 g/dL — ABNORMAL LOW (ref 6.1–8.1)

## 2020-07-24 LAB — MICROALBUMIN / CREATININE URINE RATIO
Creatinine, Urine: 10 mg/dL — ABNORMAL LOW (ref 20–275)
Microalb Creat Ratio: 20 mcg/mg creat (ref <30)
Microalb, Ur: 0.2 mg/dL

## 2020-07-24 LAB — CBC WITH DIFFERENTIAL/PLATELET
Absolute Monocytes: 572 cells/uL (ref 200–950)
Basophils Absolute: 83 cells/uL (ref 0–200)
Basophils Relative: 1.5 %
Eosinophils Absolute: 424 cells/uL (ref 15–500)
Eosinophils Relative: 7.7 %
HCT: 39.3 % (ref 35.0–45.0)
Hemoglobin: 13.1 g/dL (ref 11.7–15.5)
Lymphs Abs: 1953 cells/uL (ref 850–3900)
MCH: 32.2 pg (ref 27.0–33.0)
MCHC: 33.3 g/dL (ref 32.0–36.0)
MCV: 96.6 fL (ref 80.0–100.0)
MPV: 9.7 fL (ref 7.5–12.5)
Monocytes Relative: 10.4 %
Neutro Abs: 2470 cells/uL (ref 1500–7800)
Neutrophils Relative %: 44.9 %
Platelets: 256 10*3/uL (ref 140–400)
RBC: 4.07 10*6/uL (ref 3.80–5.10)
RDW: 12 % (ref 11.0–15.0)
Total Lymphocyte: 35.5 %
WBC: 5.5 10*3/uL (ref 3.8–10.8)

## 2020-07-24 LAB — LIPID PANEL
Cholesterol: 165 mg/dL (ref <200)
HDL: 50 mg/dL (ref >=50)
LDL Cholesterol (Calc): 94 mg/dL (calc)
Non-HDL Cholesterol (Calc): 115 mg/dL (calc) (ref <130)
Total CHOL/HDL Ratio: 3.3 (calc) (ref <5.0)
Triglycerides: 110 mg/dL (ref <150)

## 2020-07-24 LAB — HEMOGLOBIN A1C
Hgb A1c MFr Bld: 4.8 % of total Hgb (ref <5.7)
Mean Plasma Glucose: 91 (calc)
eAG (mmol/L): 5 (calc)

## 2020-07-24 LAB — T4, FREE: Free T4: 1.1 ng/dL (ref 0.8–1.8)

## 2020-07-24 LAB — TSH: TSH: 4.97 mIU/L — ABNORMAL HIGH (ref 0.40–4.50)

## 2020-07-28 ENCOUNTER — Encounter: Payer: Self-pay | Admitting: Family Medicine

## 2020-07-28 ENCOUNTER — Ambulatory Visit (INDEPENDENT_AMBULATORY_CARE_PROVIDER_SITE_OTHER): Payer: Medicare Other | Admitting: Family Medicine

## 2020-07-28 ENCOUNTER — Other Ambulatory Visit: Payer: Self-pay

## 2020-07-28 VITALS — BP 144/80 | HR 70 | Temp 97.3°F | Ht 62.0 in | Wt 110.6 lb

## 2020-07-28 DIAGNOSIS — R7303 Prediabetes: Secondary | ICD-10-CM

## 2020-07-28 DIAGNOSIS — E039 Hypothyroidism, unspecified: Secondary | ICD-10-CM | POA: Diagnosis not present

## 2020-07-28 DIAGNOSIS — E46 Unspecified protein-calorie malnutrition: Secondary | ICD-10-CM

## 2020-07-28 DIAGNOSIS — Z23 Encounter for immunization: Secondary | ICD-10-CM

## 2020-07-28 DIAGNOSIS — M25552 Pain in left hip: Secondary | ICD-10-CM

## 2020-07-28 DIAGNOSIS — I2510 Atherosclerotic heart disease of native coronary artery without angina pectoris: Secondary | ICD-10-CM

## 2020-07-28 DIAGNOSIS — I10 Essential (primary) hypertension: Secondary | ICD-10-CM | POA: Diagnosis not present

## 2020-07-28 DIAGNOSIS — E782 Mixed hyperlipidemia: Secondary | ICD-10-CM | POA: Diagnosis not present

## 2020-07-28 DIAGNOSIS — S81011A Laceration without foreign body, right knee, initial encounter: Secondary | ICD-10-CM

## 2020-07-28 MED ORDER — EZETIMIBE 10 MG PO TABS: 10.0000 mg | ORAL_TABLET | Freq: Every day | ORAL | 3 refills | Status: DC

## 2020-07-28 NOTE — Patient Instructions (Addendum)
-  see if you were ever on metoprolol or coreg (beta blocker) after your heart attack. We typically leave you on this forever. If blood pressure not better will add this on.   -keep a blood pressure log for me  -adding on zetia to try and get LDL under 70. Will repeat in 6 months!   -repeat labs in 6-8 weeks. Orders are in system.   Tetanus booster today.   [facility].   Hip xray:  I have ordered xrays for you. At this time we do not have xrays in our clinic. You will have to go to our [facility]. The address is [address]  xray is located in the basement.  Hours of operation are M-F 8:30am to 5:00pm.  Closed for lunch between 12:30 and 1:00pm.    See you in 6 months. So good to see you!!!!! Dr. Rogers Blocker

## 2020-07-28 NOTE — Progress Notes (Signed)
Patient: Crystal Barnes MRN: [id number] DOB: December 09, 1950 PCP: Orma Flaming, MD     Subjective:  Chief Complaint  Patient presents with  . Hyperlipidemia  . Hypertension  . Hypothyroidism  . Coronary Artery Disease  . Prediabetes    HPI: The patient is a 69 y.o. female who presents today for chronic medical management follow up.   Hypertension: Here for follow up of hypertension.  Currently on cozaar 50mg /day.  Takes medication as prescribed and denies any side effects. Exercise includes walking her dog. Weight has been stable. Denies any chest pain, headaches, shortness of breath, vision changes, swelling in lower extremities.   Hypothyroidism Currently on levothyroxine 86mcg/day. Her TSH is slightly off and we will recheck in 6 weeks. She has no symptoms of hypothyroidism.   Prediabetes She is very well controlled. Has done labs and will review today. Her a1c was 4.8. extremely well controlled.   CAD/hyperlipidemia Currently on crestor 40mg /day. LDL still not below 70. Reviewed labs and we are going to start her on zetia. On daily ASA and not on beta blocker after MI. She thinks the name sounds familiar, unsure why it was stopped.    Has knee laceration and needs tetanus booster.   Left hip pain Has had intermittently. Can not recall precipitating factors. Denies any trauma. Denies any pivoting, increased walking/running that woud irritate it. She did have what sounds like inward turned toes and had to wear some device around her hips to help turn her leg from the hip joint on the left side. She has not really tried any medication. Not really bothering her now, come and gores. Denies any radiation of the pain.   Has had her covid vaccines.   Review of Systems  Constitutional: Negative for chills, fatigue and fever.  HENT: Negative for dental problem, ear pain, hearing loss and trouble swallowing.   Eyes: Negative for visual disturbance.  Respiratory: Negative for  cough, chest tightness and shortness of breath.   Cardiovascular: Negative for chest pain, palpitations and leg swelling.  Gastrointestinal: Negative for abdominal pain, blood in stool, diarrhea and nausea.  Endocrine: Negative for cold intolerance, polydipsia, polyphagia and polyuria.  Genitourinary: Negative for dysuria and hematuria.  Musculoskeletal: Negative for arthralgias.  Skin: Negative for rash.  Neurological: Negative for dizziness and headaches.  Psychiatric/Behavioral: Negative for dysphoric mood and sleep disturbance. The patient is not nervous/anxious.     Allergies Patient has No Known Allergies.  Past Medical History Patient  has a past medical history of Arthritis, CAD (coronary artery disease), Cataract, Hyperlipidemia, Hypertension, Hypothyroidism, Myocardial infarction [facility]), and Thyroid disease.  Surgical History Patient  has a past surgical history that includes Abdominal hysterectomy (2008); Coronary stent placement (2014); Cataract extraction (Bilateral); Tonsillectomy; Flexible sigmoidoscopy (N/A, 11/24/2019); Endoscopic mucosal resection (N/A, 11/24/2019); Submucosal lifting injection (11/24/2019); and Hemostasis clip placement (11/24/2019).  Family History Pateint's family history includes Esophageal cancer in her father.  Social History Patient  reports that she has quit smoking. Her smoking use included cigarettes. She quit after 36.00 years of use. She has never used smokeless tobacco. She reports previous alcohol use. She reports that she does not use drugs.    Objective: Vitals:   07/28/20 1354 07/28/20 1448  BP: (!) 148/88 (!) 144/80  Pulse: 70   Temp: (!) 97.3 F (36.3 C)   TempSrc: Temporal   SpO2: 98%   Weight: 110 lb 9.6 oz (50.2 kg)   Height: 5\' 2"  (1.575 m)     Body mass index  is 20.23 kg/m.  Physical Exam Vitals reviewed.  Constitutional:      Appearance: Normal appearance. She is well-developed.  HENT:     Head: Normocephalic and  atraumatic.     Right Ear: Tympanic membrane, ear canal and external ear normal.     Left Ear: Tympanic membrane, ear canal and external ear normal.  Eyes:     Conjunctiva/sclera: Conjunctivae normal.     Pupils: Pupils are equal, round, and reactive to light.  Neck:     Thyroid: No thyromegaly.     Vascular: No carotid bruit.  Cardiovascular:     Rate and Rhythm: Normal rate and regular rhythm.     Pulses: Normal pulses.     Heart sounds: Normal heart sounds. No murmur heard.   Pulmonary:     Effort: Pulmonary effort is normal.     Breath sounds: Normal breath sounds.  Abdominal:     General: Abdomen is flat. Bowel sounds are normal. There is no distension.     Palpations: Abdomen is soft.     Tenderness: There is no abdominal tenderness.  Musculoskeletal:     Cervical back: Normal range of motion and neck supple.     Comments: Point TTP over left greater trochanteric process of left hip. Gait normal.   Lymphadenopathy:     Cervical: No cervical adenopathy.  Skin:    General: Skin is warm and dry.     Capillary Refill: Capillary refill takes less than 2 seconds.     Findings: No rash.  Neurological:     General: No focal deficit present.     Mental Status: She is alert and oriented to person, place, and time.     Cranial Nerves: No cranial nerve deficit.     Coordination: Coordination normal.     Deep Tendon Reflexes: Reflexes normal.  Psychiatric:        Mood and Affect: Mood normal.        Behavior: Behavior normal.      Clinical Support from 03/29/2020 in [place]  PHQ-2 Total Score 0          Assessment/plan: 1. Primary hypertension Slightly above goal today. Asked that she keep a home log for me so we can make sure she is to goal at home. Typically she is better controlled.  Continue cozaar 50mg  daily and will see her back in 6 months foru routine follow up. Labs reviewed.   2. Hypothyroidism (acquired) TSH slightly above goal. T4  normal. Could be transient. Continue current dosing and will repeat labs in 6-8 weeks.  - T4, free; Future - TSH; Future  3. Hyperlipidemia, mixed LDL goal is less than 70. She is not there. On max dosage of crestor. Adding on zetia. Repeat lipid panel at follow up.   4. Prediabetes a1c excellent and to goal. Continue with healthy diet and exercise. Repeat in 12 months.   5. Coronary artery disease involving native coronary artery of native heart without angina pectoris ldl not to goal. Adding on zetia. Also unsure why she is not on a beta blocker. She is going to check her records and look into this and see if she can fine why it was stopped. May need to add back on for her and would help with BP goal..   6. Knee laceration, right, initial encounter  - Td : Tetanus/diphtheria >7yo Preservative  free  7. Protein deficiency ([place]) She is vegan. Going to change more to vegetarian and  increase protein intake in diet. Repeat labs in 6-8 weeks.  - COMPLETE METABOLIC PANEL WITH GFR; Future  8. Left hip pain Exam consistent with bursitis. Will start with xray. Not bothering her now, but PT/sports med would be good next step for her if pain returns. She is to let me know.   - DG Hip Unilat W OR W/O Pelvis 2-3 Views Left; Future    This visit occurred during the SARS-CoV-2 public health emergency.  Safety protocols were in place, including screening questions prior to the visit, additional usage of staff PPE, and extensive cleaning of exam room while observing appropriate contact time as indicated for disinfecting solutions.     Return in about 6 months (around 01/26/2021) for cholesterol/htn .     Orma Flaming, MD [place]  07/28/2020

## 2020-07-28 NOTE — Progress Notes (Deleted)
Patient: Crystal Barnes MRN: [id number] DOB: 11-06-1950 PCP: Orma Flaming, MD     Subjective:  Chief Complaint  Patient presents with  . Annual Exam    HPI: The patient is a 69 y.o. female who presents today for Annual visit. She is requesting TDAP. She has left hip pain, starting on and off for 8 months.  Review of Systems  HENT: Negative for congestion and sore throat.   Cardiovascular: Negative for chest pain and palpitations.  Genitourinary: Negative for frequency and pelvic pain.  Musculoskeletal: Negative for back pain and gait problem.  Neurological: Negative for syncope and light-headedness.    Allergies Patient has No Known Allergies.  Past Medical History Patient  has a past medical history of Arthritis, CAD (coronary artery disease), Cataract, Hyperlipidemia, Hypertension, Hypothyroidism, Myocardial infarction [facility]), and Thyroid disease.  Surgical History Patient  has a past surgical history that includes Abdominal hysterectomy (2008); Coronary stent placement (2014); Cataract extraction (Bilateral); Tonsillectomy; Flexible sigmoidoscopy (N/A, 11/24/2019); Endoscopic mucosal resection (N/A, 11/24/2019); Submucosal lifting injection (11/24/2019); and Hemostasis clip placement (11/24/2019).  Family History Pateint's family history includes Esophageal cancer in her father.  Social History Patient  reports that she has quit smoking. Her smoking use included cigarettes. She quit after 36.00 years of use. She has never used smokeless tobacco. She reports previous alcohol use. She reports that she does not use drugs.    Objective: There were no vitals filed for this visit.  There is no height or weight on file to calculate BMI.  Physical Exam     Assessment/plan:      No follow-ups on file.     @[facility] @ 07/28/2020

## 2020-07-31 ENCOUNTER — Other Ambulatory Visit (HOSPITAL_COMMUNITY)
Admission: RE | Admit: 2020-07-31 | Discharge: 2020-07-31 | Disposition: A | Discharge status: Home / Self Care | Payer: Medicare Other | Source: Ambulatory Visit | Attending: Gastroenterology | Admitting: Gastroenterology

## 2020-07-31 DIAGNOSIS — Z20822 Contact with and (suspected) exposure to covid-19: Secondary | ICD-10-CM | POA: Diagnosis not present

## 2020-07-31 DIAGNOSIS — Z01812 Encounter for preprocedural laboratory examination: Secondary | ICD-10-CM | POA: Insufficient documentation

## 2020-07-31 LAB — SARS CORONAVIRUS 2 (TAT 6-24 HRS): SARS Coronavirus 2: NEGATIVE

## 2020-08-03 NOTE — Progress Notes (Signed)
Pre op call done for endo procedure tomorrow 08/04/20. Patient states she has been quarantined since covid test, has clear instructions on colon prep, and confirmed has a ride taking her home post procedure. All questions addressed.

## 2020-08-04 ENCOUNTER — Ambulatory Visit (HOSPITAL_COMMUNITY): Payer: Medicare Other | Admitting: Certified Registered Nurse Anesthetist

## 2020-08-04 ENCOUNTER — Other Ambulatory Visit: Payer: Self-pay

## 2020-08-04 ENCOUNTER — Encounter (HOSPITAL_COMMUNITY)
Admission: RE | Disposition: A | Discharge status: Home / Self Care | Payer: Self-pay | Source: Home / Self Care | Attending: Gastroenterology

## 2020-08-04 ENCOUNTER — Encounter (HOSPITAL_COMMUNITY): Payer: Self-pay | Admitting: Gastroenterology

## 2020-08-04 ENCOUNTER — Ambulatory Visit (HOSPITAL_COMMUNITY)
Admission: RE | Admit: 2020-08-04 | Discharge: 2020-08-04 | Disposition: A | Discharge status: Home / Self Care | Payer: Medicare Other | Attending: Gastroenterology | Admitting: Gastroenterology

## 2020-08-04 DIAGNOSIS — Z86010 Personal history of colonic polyps: Secondary | ICD-10-CM | POA: Diagnosis not present

## 2020-08-04 DIAGNOSIS — Z955 Presence of coronary angioplasty implant and graft: Secondary | ICD-10-CM | POA: Diagnosis not present

## 2020-08-04 DIAGNOSIS — Z87891 Personal history of nicotine dependence: Secondary | ICD-10-CM | POA: Insufficient documentation

## 2020-08-04 DIAGNOSIS — K6289 Other specified diseases of anus and rectum: Secondary | ICD-10-CM | POA: Diagnosis not present

## 2020-08-04 DIAGNOSIS — Z8719 Personal history of other diseases of the digestive system: Secondary | ICD-10-CM | POA: Diagnosis not present

## 2020-08-04 DIAGNOSIS — Z9889 Other specified postprocedural states: Secondary | ICD-10-CM | POA: Diagnosis not present

## 2020-08-04 DIAGNOSIS — Z09 Encounter for follow-up examination after completed treatment for conditions other than malignant neoplasm: Secondary | ICD-10-CM | POA: Insufficient documentation

## 2020-08-04 DIAGNOSIS — K641 Second degree hemorrhoids: Secondary | ICD-10-CM | POA: Diagnosis not present

## 2020-08-04 DIAGNOSIS — I252 Old myocardial infarction: Secondary | ICD-10-CM | POA: Insufficient documentation

## 2020-08-04 HISTORY — PX: HEMOSTASIS CLIP PLACEMENT: SHX6857

## 2020-08-04 HISTORY — PX: FLEXIBLE SIGMOIDOSCOPY: SHX5431

## 2020-08-04 HISTORY — PX: SUBMUCOSAL LIFTING INJECTION: SHX6855

## 2020-08-04 HISTORY — PX: ENDOSCOPIC MUCOSAL RESECTION: SHX6839

## 2020-08-04 SURGERY — SIGMOIDOSCOPY, FLEXIBLE
Anesthesia: Monitor Anesthesia Care

## 2020-08-04 MED ORDER — LIDOCAINE HCL (CARDIAC) PF 100 MG/5ML IV SOSY
PREFILLED_SYRINGE | INTRAVENOUS | Status: DC | PRN
  Administered 2020-08-04: 40 mg via INTRAVENOUS

## 2020-08-04 MED ORDER — PROPOFOL 500 MG/50ML IV EMUL
INTRAVENOUS | Status: AC
  Filled 2020-08-04: qty 50

## 2020-08-04 MED ORDER — PROPOFOL 10 MG/ML IV BOLUS
INTRAVENOUS | Status: AC
  Filled 2020-08-04: qty 20

## 2020-08-04 MED ORDER — SODIUM CHLORIDE 0.9 % IV SOLN: INTRAVENOUS | Status: DC

## 2020-08-04 MED ORDER — PROPOFOL 500 MG/50ML IV EMUL
INTRAVENOUS | Status: DC | PRN
  Administered 2020-08-04: 150 ug/kg/min via INTRAVENOUS

## 2020-08-04 MED ORDER — PROPOFOL 10 MG/ML IV BOLUS
INTRAVENOUS | Status: DC | PRN
  Administered 2020-08-04: 50 mg via INTRAVENOUS

## 2020-08-04 MED ORDER — LACTATED RINGERS IV SOLN: INTRAVENOUS | Status: DC

## 2020-08-04 MED ORDER — PHENYLEPHRINE 40 MCG/ML (10ML) SYRINGE FOR IV PUSH (FOR BLOOD PRESSURE SUPPORT)
PREFILLED_SYRINGE | INTRAVENOUS | Status: DC | PRN
  Administered 2020-08-04 (×3): 80 ug via INTRAVENOUS

## 2020-08-04 NOTE — H&P (Signed)
GASTROENTEROLOGY PROCEDURE H&P NOTE   Primary Care Physician: Orma Flaming, MD  HPI: Crystal Barnes is a 69 y.o. female who presents for flexible sigmoidoscopy for follow up of large rectal adenoma resection.  Past Medical History:  Diagnosis Date  . Arthritis   . CAD (coronary artery disease)   . Cataract   . Hyperlipidemia   . Hypertension   . Hypothyroidism   . Myocardial infarction ([place])   . Thyroid disease    Past Surgical History:  Procedure Laterality Date  . ABDOMINAL HYSTERECTOMY  2008  . CATARACT EXTRACTION Bilateral   . CORONARY STENT PLACEMENT  2014  . ENDOSCOPIC MUCOSAL RESECTION N/A 11/24/2019   Procedure: ENDOSCOPIC MUCOSAL RESECTION;  Surgeon: Rush Landmark Telford Nab., MD;  Location: [facility];  Service: Gastroenterology;  Laterality: N/A;  . FLEXIBLE SIGMOIDOSCOPY N/A 11/24/2019   Procedure: FLEXIBLE SIGMOIDOSCOPY;  Surgeon: Rush Landmark Telford Nab., MD;  Location: [facility];  Service: Gastroenterology;  Laterality: N/A;  . HEMOSTASIS CLIP PLACEMENT  11/24/2019   Procedure: HEMOSTASIS CLIP PLACEMENT;  Surgeon: Irving Copas., MD;  Location: [facility];  Service: Gastroenterology;;  . Lia Foyer LIFTING INJECTION  11/24/2019   Procedure: SUBMUCOSAL LIFTING INJECTION;  Surgeon: Irving Copas., MD;  Location: [facility];  Service: Gastroenterology;;  . TONSILLECTOMY     Current Facility-Administered Medications  Medication Dose Route Frequency Provider Last Rate Last Admin  . 0.9 %  sodium chloride infusion   Intravenous Continuous Mansouraty, Telford Nab., MD      . lactated ringers infusion   Intravenous Continuous Mansouraty, Telford Nab., MD       No Known Allergies Family History  Problem Relation Age of Onset  . Esophageal cancer Father   . Colon cancer Neg Hx   . Rectal cancer Neg Hx   . Stomach cancer Neg Hx   . Inflammatory bowel disease Neg Hx   . Liver disease Neg Hx   . Pancreatic cancer Neg Hx    Social History     Socioeconomic History  . Marital status: Single    Spouse name: Not on file  . Number of children: Not on file  . Years of education: Not on file  . Highest education level: Not on file  Occupational History  . Occupation: Retired     Comment: about a year and half ago  Tobacco Use  . Smoking status: Former Smoker    Years: 36.00    Types: Cigarettes  . Smokeless tobacco: Never Used  Vaping Use  . Vaping Use: Never used  Substance and Sexual Activity  . Alcohol use: Not Currently  . Drug use: Never  . Sexual activity: Not Currently  Other Topics Concern  . Not on file  Social History Narrative  . Not on file   Social Determinants of Health   Financial Resource Strain: Low Risk   . Difficulty of Paying Living Expenses: Not hard at all  Food Insecurity: No Food Insecurity  . Worried About Charity fundraiser in the Last Year: Never true  . Ran Out of Food in the Last Year: Never true  Transportation Needs: No Transportation Needs  . Lack of Transportation (Medical): No  . Lack of Transportation (Non-Medical): No  Physical Activity: Sufficiently Active  . Days of Exercise per Week: 5 days  . Minutes of Exercise per Session: 60 min  Stress: No Stress Concern Present  . Feeling of Stress : Not at all  Social Connections: Moderately Isolated  . Frequency of Communication  with Friends and Family: More than three times a week  . Frequency of Social Gatherings with Friends and Family: Three times a week  . Attends Religious Services: Never  . Active Member of Clubs or Organizations: Yes  . Attends Archivist Meetings: 1 to 4 times per year  . Marital Status: Never married  Intimate Partner Violence: Not At Risk  . Fear of Current or Ex-Partner: No  . Emotionally Abused: No  . Physically Abused: No  . Sexually Abused: No    Physical Exam: Vital signs in last 24 hours:     GEN: NAD EYE: Sclerae anicteric ENT: MMM CV: Non-tachycardic GI: Soft,  NT/ND NEURO:  Alert & Oriented x 3  Lab Results: No results for input(s): WBC, HGB, HCT, PLT in the last 72 hours. BMET No results for input(s): NA, K, CL, CO2, GLUCOSE, BUN, CREATININE, CALCIUM in the last 72 hours. LFT No results for input(s): PROT, ALBUMIN, AST, ALT, ALKPHOS, BILITOT, BILIDIR, IBILI in the last 72 hours. PT/INR No results for input(s): LABPROT, INR in the last 72 hours.   Impression / Plan: This is a 69 y.o.female  who presents for flexible sigmoidoscopy for follow up of large rectal adenoma resection.  The risks and benefits of endoscopic evaluation were discussed with the patient; these include but are not limited to the risk of perforation, infection, bleeding, missed lesions, lack of diagnosis, severe illness requiring hospitalization, as well as anesthesia and sedation related illnesses.  The patient is agreeable to proceed.    Justice Britain, MD [facility] Office # [phone]

## 2020-08-04 NOTE — Discharge Instructions (Signed)

## 2020-08-04 NOTE — Op Note (Signed)
[facility] Patient Name: Crystal Barnes Procedure Date: 08/04/2020 MRN: [id number] Attending MD: Justice Britain , MD Date of Birth: Oct 21, 1950 CSN: [id number] Age: 69 Admit Type: Outpatient Procedure:                Flexible Sigmoidoscopy Indications:              Surveillance: Personal history of piecemeal removal                            of adenoma on last colonoscopy (less than 1 year                            ago) Providers:                Justice Britain, MD, Baird Cancer, RN, Ladona Ridgel, Technician Referring MD:             Orma Flaming Medicines:                Monitored Anesthesia Care Complications:            No immediate complications. Estimated Blood Loss:     Estimated blood loss was minimal. Procedure:                Pre-Anesthesia Assessment:                           - Prior to the procedure, a History and Physical                            was performed, and patient medications and                            allergies were reviewed. The patient's tolerance of                            previous anesthesia was also reviewed. The risks                            and benefits of the procedure and the sedation                            options and risks were discussed with the patient.                            All questions were answered, and informed consent                            was obtained. Prior Anticoagulants: The patient has                            taken no previous anticoagulant or antiplatelet                            agents except  for aspirin. ASA Grade Assessment:                            III - A patient with severe systemic disease. After                            reviewing the risks and benefits, the patient was                            deemed in satisfactory condition to undergo the                            procedure.                           After obtaining informed consent,  the scope was                            passed under direct vision. The GIF-H190 ([id number])                            Olympus gastroscope was introduced through the anus                            and advanced to the the sigmoid colon. The flexible                            sigmoidoscopy was accomplished without difficulty.                            The patient tolerated the procedure. The quality of                            the bowel preparation was fair. Scope In: 9:52:37 AM Scope Out: 10:25:17 AM Total Procedure Duration: 0 hours 32 minutes 40 seconds  Findings:      The digital rectal exam findings include hemorrhoids and stool.       Pertinent negatives include no palpable rectal lesions.      Extensive amounts of semi-liquid semi-solid stool was found in the       entire colon, interfering with visualization. Lavage of the area was       performed using copious amounts, resulting in clearance with fair       visualization.      A large post mucosectomy scar was found in the mid rectum. The scar       tissue was healthy in appearance. There were 2 small areas of potential       residual polypoid tissue (one in middle of scar and one of the distal       aspect of scar. Preparations were made for mucosal resection. NBI       imaging and White-light endoscopy was done to demarcate the entirety of       the two lesion borders and the actual scar itself. Saline was injected       to raise the area with partial success. Piecemeal mucosal resection  using a snare and avulsion biopsies was performed. Resection and       retrieval were complete. To prevent bleeding after mucosal resection,       four hemostatic clips were successfully placed (MR conditional). There       was no bleeding at the end of the procedure.      Non-bleeding non-thrombosed internal hemorrhoids were found during       retroflexion, during perianal exam and during digital exam. The       hemorrhoids were  Grade II (internal hemorrhoids that prolapse but reduce       spontaneously). Impression:               - Stool in the entire examined colon. Preparation                            of the colon was fair after 10 minutes of lavage.                           - Hemorrhoids and stool found on digital rectal                            exam.                           - Post mucosectomy scar in the mid rectum. Two                            areas of polypoid tissue present query clip                            artifact vs recurrent adenoma. Piecemeal mucosal                            resection performed. Clips (MR conditional) were                            placed to decrease bleeding risk post-intervention.                           - Non-bleeding non-thrombosed internal hemorrhoids. Moderate Sedation:      Not Applicable - Patient had care per Anesthesia. Recommendation:           - The patient will be observed post-procedure,                            until all discharge criteria are met.                           - Discharge patient to home.                           - Patient has a contact number available for                            emergencies. The signs and symptoms of potential                              delayed complications were discussed with the                            patient. Return to normal activities tomorrow.                            Written discharge instructions were provided to the                            patient.                           - Await pathology results.                           - Repeat colonoscopy for surveillance based on                            pathology results. If no evidence of any                            adenomatous tissue, then full colonoscopy in 1-1.5                            years. If any adenomatous tissue then repeat and                            perform full colonoscopy at 6-9 month interval.                           -  The findings and recommendations were discussed                            with the patient.                           - The findings and recommendations were discussed                            with the patient's family. Procedure Code(s):        --- Professional ---                           [phone], Sigmoidoscopy, flexible; with endoscopic                            mucosal resection Diagnosis Code(s):        --- Professional ---                           K64.1, Second degree hemorrhoids                           Z98.890, Other specified postprocedural states  Z09, Encounter for follow-up examination after                            completed treatment for conditions other than                            malignant neoplasm                           Z86.010, Personal history of colonic polyps CPT copyright 2019 [employer]. All rights reserved. The codes documented in this report are preliminary and upon coder review may  be revised to meet current compliance requirements. Justice Britain, MD 08/04/2020 10:45:02 AM Number of Addenda: 0

## 2020-08-04 NOTE — Transfer of Care (Signed)
Immediate Anesthesia Transfer of Care Note  Patient: Crystal Barnes  Procedure(s) Performed: FLEXIBLE SIGMOIDOSCOPY (N/A ) SUBMUCOSAL LIFTING INJECTION HEMOSTASIS CLIP PLACEMENT ENDOSCOPIC MUCOSAL RESECTION  Patient Location: Endoscopy Unit  Anesthesia Type:MAC  Level of Consciousness: awake, alert , oriented and patient cooperative  Airway & Oxygen Therapy: Patient Spontanous Breathing and Patient connected to face mask oxygen  Post-op Assessment: Report given to RN and Post -op Vital signs reviewed and stable  Post vital signs: Reviewed and stable  Last Vitals:  Vitals Value Taken Time  BP 140/57 1033  Temp    Pulse 53   Resp    SpO2 100     Last Pain:  Vitals:   08/04/20 0754  TempSrc: Oral  PainSc: 0-No pain         Complications: No complications documented.

## 2020-08-04 NOTE — Anesthesia Preprocedure Evaluation (Signed)
Anesthesia Evaluation  Patient identified by MRN, date of birth, ID band Patient awake    Reviewed: Allergy & Precautions, NPO status , Patient's Chart, lab work & pertinent test results  Airway Mallampati: I  TM Distance: >3 FB Neck ROM: Full    Dental  (+) Missing   Pulmonary former smoker,    Pulmonary exam normal breath sounds clear to auscultation       Cardiovascular hypertension, Pt. on medications + CAD, + Past MI and + Cardiac Stents (x 1 in 2014)  Normal cardiovascular exam Rhythm:Regular Rate:Normal     Neuro/Psych negative neurological ROS  negative psych ROS   GI/Hepatic negative GI ROS, Neg liver ROS,   Endo/Other  Hypothyroidism   Renal/GU negative Renal ROS     Musculoskeletal negative musculoskeletal ROS (+)   Abdominal   Peds  Hematology HLD   Anesthesia Other Findings hx of adenomatous colonic polyps, serrated adenoma of colon  Reproductive/Obstetrics                             Anesthesia Physical  Anesthesia Plan  ASA: III  Anesthesia Plan: MAC   Post-op Pain Management:    Induction: Intravenous  PONV Risk Score and Plan: 2 and Propofol infusion and Treatment may vary due to age or medical condition  Airway Management Planned: Simple Face Mask  Additional Equipment:   Intra-op Plan:   Post-operative Plan:   Informed Consent: I have reviewed the patients History and Physical, chart, labs and discussed the procedure including the risks, benefits and alternatives for the proposed anesthesia with the patient or authorized representative who has indicated his/her understanding and acceptance.     Dental advisory given  Plan Discussed with: CRNA  Anesthesia Plan Comments:         Anesthesia Quick Evaluation

## 2020-08-04 NOTE — Anesthesia Postprocedure Evaluation (Signed)
Anesthesia Post Note  Patient: Crystal Barnes  Procedure(s) Performed: FLEXIBLE SIGMOIDOSCOPY (N/A ) SUBMUCOSAL LIFTING INJECTION HEMOSTASIS CLIP PLACEMENT ENDOSCOPIC MUCOSAL RESECTION     Patient location during evaluation: Endoscopy Anesthesia Type: MAC Level of consciousness: awake and alert Pain management: pain level controlled Vital Signs Assessment: post-procedure vital signs reviewed and stable Respiratory status: spontaneous breathing, nonlabored ventilation and respiratory function stable Cardiovascular status: blood pressure returned to baseline and stable Postop Assessment: no apparent nausea or vomiting Anesthetic complications: no   No complications documented.  Last Vitals:  Vitals:   08/04/20 1050 08/04/20 1100  BP: (!) 158/76 (!) 159/81  Pulse: (!) 54 (!) 56  Resp: 16 14  Temp:    SpO2: 99% 96%    Last Pain:  Vitals:   08/04/20 1100  TempSrc:   PainSc: 3                  Lynda Rainwater

## 2020-08-05 LAB — SURGICAL PATHOLOGY

## 2020-08-06 ENCOUNTER — Encounter (HOSPITAL_COMMUNITY): Payer: Self-pay | Admitting: Gastroenterology

## 2020-08-07 ENCOUNTER — Encounter: Payer: Self-pay | Admitting: Family Medicine

## 2020-08-09 ENCOUNTER — Ambulatory Visit: Payer: Medicare Other

## 2020-08-09 ENCOUNTER — Encounter: Payer: Self-pay | Admitting: Family Medicine

## 2020-08-09 ENCOUNTER — Other Ambulatory Visit: Payer: Self-pay | Admitting: Family Medicine

## 2020-08-10 ENCOUNTER — Other Ambulatory Visit: Payer: Self-pay

## 2020-08-10 ENCOUNTER — Ambulatory Visit: Payer: Medicare Other

## 2020-08-11 ENCOUNTER — Other Ambulatory Visit: Payer: Self-pay | Admitting: Family Medicine

## 2020-08-12 ENCOUNTER — Encounter: Payer: Self-pay | Admitting: Family Medicine

## 2020-08-12 ENCOUNTER — Encounter: Payer: Self-pay | Admitting: Gastroenterology

## 2020-08-12 MED ORDER — TRAMADOL HCL 50 MG PO TABS: 100.0000 mg | ORAL_TABLET | Freq: Four times a day (QID) | ORAL | 0 refills | Status: DC

## 2020-08-12 NOTE — Telephone Encounter (Signed)
Dr. Rogers Blocker, I already sent you a message for this please send to [facility].

## 2020-09-08 ENCOUNTER — Ambulatory Visit: Payer: Medicare Other

## 2020-09-08 ENCOUNTER — Other Ambulatory Visit: Payer: Self-pay | Admitting: Family Medicine

## 2020-09-09 ENCOUNTER — Other Ambulatory Visit: Payer: Self-pay | Admitting: Family Medicine

## 2020-09-09 MED ORDER — TRAMADOL HCL 50 MG PO TABS: 100.0000 mg | ORAL_TABLET | Freq: Four times a day (QID) | ORAL | 0 refills | Status: DC

## 2020-09-09 NOTE — Telephone Encounter (Signed)
LAST APPOINTMENT DATE: 10/20//2021   NEXT APPOINTMENT DATE: Visit date not found    LAST REFILL: 08/12/2020  QTY: 240

## 2020-09-10 NOTE — Telephone Encounter (Signed)
Sent to pharmacy 

## 2020-10-11 ENCOUNTER — Other Ambulatory Visit: Payer: Self-pay | Admitting: Family Medicine

## 2020-10-12 ENCOUNTER — Encounter: Payer: Self-pay | Admitting: Family Medicine

## 2020-10-12 MED ORDER — TRAMADOL HCL 50 MG PO TABS: 100.0000 mg | ORAL_TABLET | Freq: Four times a day (QID) | ORAL | 0 refills | Status: DC

## 2020-10-20 ENCOUNTER — Ambulatory Visit
Admission: RE | Admit: 2020-10-20 | Discharge: 2020-10-20 | Disposition: A | Discharge status: Home / Self Care | Payer: Medicare Other | Source: Ambulatory Visit | Attending: Family Medicine | Admitting: Family Medicine

## 2020-10-20 ENCOUNTER — Other Ambulatory Visit: Payer: Self-pay | Admitting: Family Medicine

## 2020-10-20 ENCOUNTER — Other Ambulatory Visit: Payer: Self-pay

## 2020-10-20 DIAGNOSIS — Z1231 Encounter for screening mammogram for malignant neoplasm of breast: Secondary | ICD-10-CM

## 2020-11-09 ENCOUNTER — Encounter: Payer: Self-pay | Admitting: Family Medicine

## 2020-11-10 ENCOUNTER — Other Ambulatory Visit: Payer: Self-pay

## 2020-11-10 DIAGNOSIS — G8929 Other chronic pain: Secondary | ICD-10-CM

## 2020-11-11 ENCOUNTER — Other Ambulatory Visit: Payer: Self-pay | Admitting: Family Medicine

## 2020-11-24 ENCOUNTER — Encounter: Payer: Self-pay | Admitting: Physical Medicine & Rehabilitation

## 2020-12-07 ENCOUNTER — Other Ambulatory Visit: Payer: Self-pay | Admitting: Family Medicine

## 2020-12-08 ENCOUNTER — Other Ambulatory Visit: Payer: Self-pay | Admitting: Family Medicine

## 2020-12-08 MED ORDER — TRAMADOL HCL 50 MG PO TABS: ORAL_TABLET | ORAL | 0 refills | Status: DC

## 2020-12-08 NOTE — Telephone Encounter (Signed)
LAST APPOINTMENT DATE: 11/11/2020   NEXT APPOINTMENT DATE: 6/27/ QTY:

## 2020-12-09 ENCOUNTER — Encounter: Payer: Self-pay | Admitting: Family Medicine

## 2020-12-11 ENCOUNTER — Other Ambulatory Visit: Payer: Self-pay | Admitting: Family Medicine

## 2020-12-18 ENCOUNTER — Other Ambulatory Visit: Payer: Self-pay | Admitting: Family Medicine

## 2020-12-23 ENCOUNTER — Encounter: Payer: Self-pay | Admitting: Physical Medicine & Rehabilitation

## 2020-12-23 ENCOUNTER — Encounter: Payer: Medicare Other | Attending: Physical Medicine & Rehabilitation | Admitting: Physical Medicine & Rehabilitation

## 2020-12-23 ENCOUNTER — Other Ambulatory Visit: Payer: Self-pay

## 2020-12-23 VITALS — BP 112/77 | HR 76 | Temp 98.7°F | Ht 62.5 in | Wt 112.0 lb

## 2020-12-23 DIAGNOSIS — M7918 Myalgia, other site: Secondary | ICD-10-CM | POA: Insufficient documentation

## 2020-12-23 NOTE — Patient Instructions (Signed)
Referral to pelvic floor specialist with [place] , PT office will contact you.  May follow up with Dr Rogers Blocker

## 2020-12-23 NOTE — Progress Notes (Signed)
Subjective:    Patient ID: Crystal Barnes, female    DOB: December 31, 1950, 70 y.o.   MRN: [id number]  HPI Chief complaint: Chronic pain in groin area 70 year old female referred by her primary physician to evaluate for interventional pain techniques that may be helpful for patient's complaint of groin pain.  The patient states that the pain started like a bolt of lightening at night that went through her brain into her groin area.  She specifically states that is the area between the vagina and the anus.  She denied any pre-existing trauma to that area.  No history of sexual abuse or assault.  She sought medical care and was seen by primary MD as well as pain management specialist.  She tried some type of nerve block which was not helpful.  She reportedly had hip and spine imaging that did not show any abnormalities. RIght inguinal pain sometimes shoots down medial aspect of leg No pain with urination or defecation, or intercourse.  Has seen pelvic floor PT in past but this therapist only took cash,   , started  was taking mellaril as teenager for "psychosis"     No numbness and tingling but has sharp pain that increases with anxiety  Pain is controlled by tramadol 400mg  per day plus gabapentin 300mg  QID  Pt states she would rather not take meds and is asking about radiofrequency ablation Past medical history: Coronary artery disease requiring stent placement x2 about 2 years ago.  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 Pain Inventory Average Pain 2 Pain Right Now 3 My pain is constant and sharp  In the last 24 hours, has pain interfered with the following? General activity 1 Relation with others 1 Enjoyment of life 1 What TIME of day is your pain at its worst? morning  Sleep (in general) Good  Pain is worse with: sitting and inactivity Pain improves with: medication Relief from Meds: 2  walk without assistance how many minutes can you walk? 120  min Do you have any goals in this area?  yes  employed # of hrs/week 12  bladder control problems  new  new    Family History  Problem Relation Age of Onset  . Esophageal cancer Father   . Colon cancer Neg Hx   . Rectal cancer Neg Hx   . Stomach cancer Neg Hx   . Inflammatory bowel disease Neg Hx   . Liver disease Neg Hx   . Pancreatic cancer Neg Hx    Social History   Socioeconomic History  . Marital status: Single    Spouse name: Not on file  . Number of children: Not on file  . Years of education: Not on file  . Highest education level: Not on file  Occupational History  . Occupation: Retired     Comment: about a year and half ago  Tobacco Use  . Smoking status: Former Smoker    Years: 36.00    Types: Cigarettes  . Smokeless tobacco: Never Used  Vaping Use  . Vaping Use: Never used  Substance and Sexual Activity  . Alcohol use: Not Currently  . Drug use: Never  . Sexual activity: Not Currently  Other Topics Concern  . Not on file  Social History Narrative  . Not on file   Social Determinants of Health   Financial Resource Strain: Low Risk   . Difficulty of Paying Living Expenses: Not hard at all  Food Insecurity:  No Food Insecurity  . Worried About Charity fundraiser in the Last Year: Never true  . Ran Out of Food in the Last Year: Never true  Transportation Needs: No Transportation Needs  . Lack of Transportation (Medical): No  . Lack of Transportation (Non-Medical): No  Physical Activity: Sufficiently Active  . Days of Exercise per Week: 5 days  . Minutes of Exercise per Session: 60 min  Stress: No Stress Concern Present  . Feeling of Stress : Not at all  Social Connections: Moderately Isolated  . Frequency of Communication with Friends and Family: More than three times a week  . Frequency of Social Gatherings with Friends and Family: Three times a week  . Attends Religious Services: Never  . Active Member of Clubs or Organizations: Yes  .  Attends Archivist Meetings: 1 to 4 times per year  . Marital Status: Never married   Past Surgical History:  Procedure Laterality Date  . ABDOMINAL HYSTERECTOMY  2008  . CATARACT EXTRACTION Bilateral   . CORONARY STENT PLACEMENT  2014  . ENDOSCOPIC MUCOSAL RESECTION N/A 11/24/2019   Procedure: ENDOSCOPIC MUCOSAL RESECTION;  Surgeon: Rush Landmark Telford Nab., MD;  Location: [place];  Service: Gastroenterology;  Laterality: N/A;  . ENDOSCOPIC MUCOSAL RESECTION  08/04/2020   Procedure: ENDOSCOPIC MUCOSAL RESECTION;  Surgeon: Rush Landmark Telford Nab., MD;  Location: [facility];  Service: Gastroenterology;;  . Otho Darner SIGMOIDOSCOPY N/A 11/24/2019   Procedure: Beryle Quant;  Surgeon: Irving Copas., MD;  Location: [place];  Service: Gastroenterology;  Laterality: N/A;  . FLEXIBLE SIGMOIDOSCOPY N/A 08/04/2020   Procedure: FLEXIBLE SIGMOIDOSCOPY;  Surgeon: Rush Landmark Telford Nab., MD;  Location: [facility];  Service: Gastroenterology;  Laterality: N/A;  . HEMOSTASIS CLIP PLACEMENT  11/24/2019   Procedure: HEMOSTASIS CLIP PLACEMENT;  Surgeon: Irving Copas., MD;  Location: [place];  Service: Gastroenterology;;  . HEMOSTASIS CLIP PLACEMENT  08/04/2020   Procedure: HEMOSTASIS CLIP PLACEMENT;  Surgeon: Irving Copas., MD;  Location: [facility];  Service: Gastroenterology;;  . [place]ia [place] LIFTING INJECTION  11/24/2019   Procedure: SUBMUCOSAL LIFTING INJECTION;  Surgeon: Irving Copas., MD;  Location: [place];  Service: Gastroenterology;;  . [place] INJECTION  08/04/2020   Procedure: SUBMUCOSAL LIFTING INJECTION;  Surgeon: Irving Copas., MD;  Location: [facility];  Service: Gastroenterology;;  . TONSILLECTOMY     Past Medical History:  Diagnosis Date  . Arthritis   . CAD (coronary artery disease)   . Cataract   . Hyperlipidemia   . Hypertension   . Hypothyroidism   . Myocardial infarction ([place])    . Thyroid disease    BP 112/77   Pulse 76   Temp 98.7 F (37.1 C)   Ht 5' 2.5" (1.588 m)   Wt 112 lb (50.8 kg)   LMP  (LMP Unknown)   SpO2 98%   BMI 20.16 kg/m   Opioid Risk Score:   Fall Risk Score:  `1  Depression screen PHQ 2/9  Depression screen [facility] 2/9 03/29/2020 06/23/2019 02/10/2019  Decreased Interest 0 0 0  Down, Depressed, Hopeless 0 0 0  PHQ - 2 Score 0 0 0  Altered sleeping - - 2  Tired, decreased energy - - 0  Change in appetite - - 0  Feeling bad or failure about yourself  - - 0  Trouble concentrating - - 0  Moving slowly or fidgety/restless - - 0  Suicidal thoughts - - 0  PHQ-9 Score - - 2  Difficult doing work/chores - -  Not difficult at all    Review of Systems  Constitutional: Negative.   HENT: Negative.   Eyes: Negative.   Respiratory: Negative.   Cardiovascular: Negative.   Gastrointestinal: Negative.   Endocrine: Negative.   Genitourinary: Positive for difficulty urinating.  Skin: Negative.   Allergic/Immunologic: Negative.   Neurological: Negative.   Hematological: Negative.   Psychiatric/Behavioral: Negative.   All other systems reviewed and are negative.      Objective:   Physical Exam Vitals and nursing note reviewed.  Constitutional:      General: She is not in acute distress.    Appearance: She is normal weight.  HENT:     Head: Normocephalic and atraumatic.  Eyes:     Extraocular Movements: Extraocular movements intact.     Conjunctiva/sclera: Conjunctivae normal.     Pupils: Pupils are equal, round, and reactive to light.  Cardiovascular:     Rate and Rhythm: Normal rate and regular rhythm.     Heart sounds: Normal heart sounds. No murmur heard.   Pulmonary:     Effort: Pulmonary effort is normal. No respiratory distress.     Breath sounds: Normal breath sounds.  Abdominal:     General: Bowel sounds are normal. There is no distension.     Palpations: Abdomen is soft.  Genitourinary:    Comments: There is mild  tenderness over the puborectalis/pubococcygeus at midline.  Musculoskeletal:        General: No tenderness.     Cervical back: Normal range of motion.     Comments: No tenderness over the PSIS no tenderness over ASIS no tenderness around the inferior or superior pubic ramus or symphysis pubis.  No tenderness around the ischium bilaterally no tenderness over the greater trochanters of the hip Normal hip range of motion bilaterally Lumbar range of motion without pain bilaterally  Sacral thrust (prone) : Negative Lateral compression: Negative FABER's: Negative   Skin:    General: Skin is warm and dry.  Neurological:     Mental Status: She is alert and oriented to person, place, and time.     Coordination: Coordination is intact.     Gait: Gait is intact.     Comments: Motor strength is 5/5 bilateral hip flexor knee extensor ankle dorsiflexor and plantar flexor. No evidence of increased tone in the hip adductor muscles  Negative straight leg raising  Psychiatric:        Mood and Affect: Mood normal.        Behavior: Behavior normal.           Assessment & Plan:  #1.  Chronic pelvic floor myalgia.  We discussed that this is mainly a muscular issue.  She has some limited visits of pelvic floor physical therapy and we discussed that this would be the treatment of choice. We discussed since there is not a isolated nerve that can be lesioned by radiofrequency, that this is not an option. She is getting good relief with her pain medications i.e. tramadol 400 mg a day and gabapentin 900 mg/day.  If she has some additional improvements after going through pelvic floor therapy, her medication doses may be able to be reduced. She will follow-up with her primary care physician, physical medicine rehabilitation follow-up on as-needed basis.

## 2020-12-27 ENCOUNTER — Encounter: Payer: Self-pay | Admitting: Family Medicine

## 2020-12-27 NOTE — Telephone Encounter (Signed)
Orders can be put it in the day of the visit.

## 2020-12-27 NOTE — Telephone Encounter (Signed)
Can we put orders in

## 2020-12-28 ENCOUNTER — Other Ambulatory Visit: Payer: Medicare Other

## 2020-12-29 ENCOUNTER — Encounter: Payer: Self-pay | Admitting: Family Medicine

## 2020-12-29 ENCOUNTER — Ambulatory Visit (INDEPENDENT_AMBULATORY_CARE_PROVIDER_SITE_OTHER): Payer: Medicare Other | Admitting: Family Medicine

## 2020-12-29 ENCOUNTER — Other Ambulatory Visit: Payer: Self-pay

## 2020-12-29 VITALS — BP 120/70 | HR 68 | Temp 98.2°F | Ht 62.0 in | Wt 112.4 lb

## 2020-12-29 DIAGNOSIS — I2510 Atherosclerotic heart disease of native coronary artery without angina pectoris: Secondary | ICD-10-CM | POA: Diagnosis not present

## 2020-12-29 DIAGNOSIS — R079 Chest pain, unspecified: Secondary | ICD-10-CM | POA: Diagnosis not present

## 2020-12-29 DIAGNOSIS — E039 Hypothyroidism, unspecified: Secondary | ICD-10-CM

## 2020-12-29 NOTE — Telephone Encounter (Signed)
Pt scheduled for today @ 2:30p

## 2020-12-29 NOTE — Patient Instructions (Addendum)
-  cardiology referral, echo and treadmill stress test. They will call you with all of these.  -ekg looks good.  -rechecking thyroid today.   -see if you were on a beta blocker after heart attack too.   Talk soon!  Dr. Rogers Blocker

## 2020-12-29 NOTE — Progress Notes (Signed)
Patient: Crystal Barnes MRN: [id number] DOB: [birth year] PCP: Orma Flaming, MD     Subjective:  Chief Complaint  Patient presents with  . Chest Pain    HPI: The patient is a 70 y.o. female who presents today for chest pain.  She was driving to the pain clinic and she felt pain in the center, left side of her chest. It lasted for 5 days.  She has had none since that time. She took garlic, metamucil and coq 10 and it went away. Pain was not worse with exertion. Described as stabbing/dull in nature. Did not radiate anywhere. Pain 4/10. She had no diaphoresis or shortness of breath. She states pain waxed and waned.  She has hx of CAD with stent placed in 2015.  Last stress test was in 2016. Her last cholesterol panel showed her LDL was 94. On crestor 40mg  and zetia 10mg . Takes aspirin daily.   Review of Systems  Constitutional: Negative for chills, fatigue and fever.  HENT: Negative for dental problem, ear pain, hearing loss and trouble swallowing.   Eyes: Negative for visual disturbance.  Respiratory: Negative for cough, chest tightness and shortness of breath.   Cardiovascular: Negative for chest pain, palpitations and leg swelling.  Gastrointestinal: Negative for abdominal pain, blood in stool, diarrhea and nausea.  Endocrine: Negative for cold intolerance, polydipsia, polyphagia and polyuria.  Genitourinary: Negative for dysuria and hematuria.  Musculoskeletal: Negative for arthralgias.  Skin: Negative for rash.  Neurological: Negative for dizziness and headaches.  Psychiatric/Behavioral: Negative for dysphoric mood and sleep disturbance. The patient is not nervous/anxious.     Allergies Patient has No Known Allergies.  Past Medical History Patient  has a past medical history of Arthritis, CAD (coronary artery disease), Cataract, Hyperlipidemia, Hypertension, Hypothyroidism, Myocardial infarction [facility]), and Thyroid disease.  Surgical History Patient  has a past surgical  history that includes Abdominal hysterectomy (2008); Coronary stent placement (2014); Cataract extraction (Bilateral); Tonsillectomy; Flexible sigmoidoscopy (N/A, 11/24/2019); Endoscopic mucosal resection (N/A, 11/24/2019); Submucosal lifting injection (11/24/2019); Hemostasis clip placement (11/24/2019); Flexible sigmoidoscopy (N/A, 08/04/2020); Submucosal lifting injection (08/04/2020); Hemostasis clip placement (08/04/2020); and Endoscopic mucosal resection (08/04/2020).  Family History Pateint's family history includes Esophageal cancer in her father.  Social History Patient  reports that she has quit smoking. Her smoking use included cigarettes. She quit after 36.00 years of use. She has never used smokeless tobacco. She reports previous alcohol use. She reports that she does not use drugs.    Objective: Vitals:   12/29/20 1450  BP: 120/70  Pulse: 68  Temp: 98.2 F (36.8 C)  TempSrc: Temporal  SpO2: 98%  Weight: 112 lb 6.4 oz (51 kg)  Height: 5\' 2"  (1.575 m)    Body mass index is 20.56 kg/m.  Physical Exam Vitals reviewed.  Constitutional:      Appearance: She is well-developed and normal weight.  HENT:     Head: Normocephalic and atraumatic.  Cardiovascular:     Rate and Rhythm: Normal rate and regular rhythm.     Heart sounds: Normal heart sounds. No murmur heard.   Pulmonary:     Effort: Pulmonary effort is normal.     Breath sounds: Normal breath sounds.  Chest:     Chest wall: No tenderness.  Abdominal:     General: Bowel sounds are normal.     Palpations: Abdomen is soft.  Musculoskeletal:     Cervical back: Normal range of motion and neck supple.  Skin:    General: Skin is warm and  dry.     Capillary Refill: Capillary refill takes less than 2 seconds.  Neurological:     General: No focal deficit present.     Mental Status: She is alert and oriented to person, place, and time.    Ekg: sinus brady with rate of 57    Assessment/plan: 1. Chest pain,  unspecified type Has subsided and has had no other incidents. Appears atypical but has strong coronary history. Checking echo/stress and referring to cards. Precautions given.  - EKG 12-Lead - Exercise Tolerance Test; Future - Cardiac Stress Test: Informed Consent Details: Physician/Practitioner Attestation; Transcribe to consent form and obtain patient signature - Ambulatory referral to Cardiology - ECHOCARDIOGRAM COMPLETE; Future  2. Coronary artery disease involving native coronary artery of native heart without angina pectoris  - Ambulatory referral to Cardiology - ECHOCARDIOGRAM COMPLETE; Future  3. Hypothyroidism (acquired)  - T4, free - TSH    This visit occurred during the SARS-CoV-2 public health emergency.  Safety protocols were in place, including screening questions prior to the visit, additional usage of staff PPE, and extensive cleaning of exam room while observing appropriate contact time as indicated for disinfecting solutions.     Return if symptoms worsen or fail to improve.   Orma Flaming, MD [place]   12/29/2020

## 2020-12-30 LAB — TSH: TSH: 2.07 u[IU]/mL (ref 0.35–4.50)

## 2020-12-30 LAB — T4, FREE: Free T4: 0.82 ng/dL (ref 0.60–1.60)

## 2021-01-04 ENCOUNTER — Other Ambulatory Visit: Payer: Self-pay

## 2021-01-04 ENCOUNTER — Other Ambulatory Visit (HOSPITAL_COMMUNITY)
Admission: RE | Admit: 2021-01-04 | Discharge: 2021-01-04 | Disposition: A | Discharge status: Home / Self Care | Payer: Medicare Other | Source: Ambulatory Visit | Attending: Family Medicine | Admitting: Family Medicine

## 2021-01-04 ENCOUNTER — Ambulatory Visit (HOSPITAL_BASED_OUTPATIENT_CLINIC_OR_DEPARTMENT_OTHER): Payer: Medicare Other

## 2021-01-04 DIAGNOSIS — I2510 Atherosclerotic heart disease of native coronary artery without angina pectoris: Secondary | ICD-10-CM | POA: Diagnosis not present

## 2021-01-04 DIAGNOSIS — R079 Chest pain, unspecified: Secondary | ICD-10-CM | POA: Insufficient documentation

## 2021-01-04 DIAGNOSIS — Z20822 Contact with and (suspected) exposure to covid-19: Secondary | ICD-10-CM | POA: Insufficient documentation

## 2021-01-04 DIAGNOSIS — I700 Atherosclerosis of aorta: Secondary | ICD-10-CM | POA: Insufficient documentation

## 2021-01-04 LAB — ECHOCARDIOGRAM COMPLETE
Area-P 1/2: 2.6 cm2
S' Lateral: 2.2 cm

## 2021-01-04 LAB — SARS CORONAVIRUS 2 (TAT 6-24 HRS): SARS Coronavirus 2: NEGATIVE

## 2021-01-05 ENCOUNTER — Encounter: Payer: Self-pay | Admitting: Family Medicine

## 2021-01-06 ENCOUNTER — Encounter: Payer: Self-pay | Admitting: Family Medicine

## 2021-01-06 ENCOUNTER — Other Ambulatory Visit: Payer: Self-pay

## 2021-01-06 ENCOUNTER — Ambulatory Visit (INDEPENDENT_AMBULATORY_CARE_PROVIDER_SITE_OTHER): Payer: Medicare Other

## 2021-01-06 DIAGNOSIS — R079 Chest pain, unspecified: Secondary | ICD-10-CM

## 2021-01-06 LAB — EXERCISE TOLERANCE TEST
Estimated workload: 12.9 METS
Exercise duration (min): 10 min
Exercise duration (sec): 43 s
MPHR: 153 {beats}/min
Peak HR: 155 {beats}/min
Percent HR: 101 %
RPE: 18
Rest HR: 73 {beats}/min

## 2021-01-07 ENCOUNTER — Encounter: Payer: Self-pay | Admitting: Family Medicine

## 2021-01-07 MED ORDER — TRAMADOL HCL 50 MG PO TABS: ORAL_TABLET | ORAL | 0 refills | Status: DC

## 2021-01-19 ENCOUNTER — Other Ambulatory Visit: Payer: Self-pay

## 2021-01-19 ENCOUNTER — Encounter: Payer: Self-pay | Admitting: Cardiovascular Disease

## 2021-01-19 ENCOUNTER — Encounter: Payer: Self-pay | Admitting: Family Medicine

## 2021-01-19 ENCOUNTER — Ambulatory Visit: Payer: Medicare Other | Admitting: Cardiovascular Disease

## 2021-01-19 VITALS — BP 138/72 | HR 61 | Ht 62.0 in | Wt 111.0 lb

## 2021-01-19 DIAGNOSIS — E782 Mixed hyperlipidemia: Secondary | ICD-10-CM | POA: Diagnosis not present

## 2021-01-19 DIAGNOSIS — E039 Hypothyroidism, unspecified: Secondary | ICD-10-CM | POA: Diagnosis not present

## 2021-01-19 DIAGNOSIS — I10 Essential (primary) hypertension: Secondary | ICD-10-CM

## 2021-01-19 DIAGNOSIS — E7800 Pure hypercholesterolemia, unspecified: Secondary | ICD-10-CM

## 2021-01-19 DIAGNOSIS — I2510 Atherosclerotic heart disease of native coronary artery without angina pectoris: Secondary | ICD-10-CM

## 2021-01-19 LAB — LIPID PANEL
Chol/HDL Ratio: 2.9 ratio (ref 0.0–4.4)
Cholesterol, Total: 152 mg/dL (ref 100–199)
HDL: 53 mg/dL (ref >39)
LDL Chol Calc (NIH): 79 mg/dL (ref 0–99)
Triglycerides: 110 mg/dL (ref 0–149)
VLDL Cholesterol Cal: 20 mg/dL (ref 5–40)

## 2021-01-19 MED ORDER — ASPIRIN EC 81 MG PO TBEC: 81.0000 mg | DELAYED_RELEASE_TABLET | Freq: Every day | ORAL | Status: DC

## 2021-01-19 NOTE — Patient Instructions (Signed)
Medication Instructions:  No changes  *If you need a refill on your cardiac medications before your next appointment, please call your pharmacy*   Lab Work: Fasting lipid to be drawn today.   If you have labs (blood work) drawn today and your tests are completely normal, you will receive your results only by: [name] MyChart Message (if you have MyChart) OR . A paper copy in the mail If you have any lab test that is abnormal or we need to change your treatment, we will call you to review the results.   Testing/Procedures: None ordered.    Follow-Up: At [facility], you and your health needs are our priority.  As part of our continuing mission to provide you with exceptional heart care, we have created designated Provider Care Teams.  These Care Teams include your primary Cardiologist (physician) and Advanced Practice Providers (APPs -  Physician Assistants and Nurse Practitioners) who all work together to provide you with the care you need, when you need it.  We recommend signing up for the patient portal called "MyChart".  Sign up information is provided on this After Visit Summary.  MyChart is used to connect with patients for Virtual Visits (Telemedicine).  Patients are able to view lab/test results, encounter notes, upcoming appointments, etc.  Non-urgent messages can be sent to your provider as well.   To learn more about what you can do with MyChart, go to [email or web address].    Your next appointment:   12 month(s)  The format for your next appointment:   In Person  Provider:   Sanda Klein, MD

## 2021-01-19 NOTE — Progress Notes (Signed)
Cardiology Consultation Note:    Date:  01/21/2021   ID:  Crystal Barnes, DOB [birth year], MRN [id number]  PCP:  Orma Flaming, MD   [facility]  Cardiologist:  Sanda Klein, MD NEW Advanced Practice Provider:  No care team member to display Electrophysiologist:  None       Referring MD: Orma Flaming, MD   No chief complaint on file. Crystal Barnes is a 70 y.o. female who is being seen today for the evaluation of CAD at the request of Orma Flaming, MD.   History of Present Illness:    Crystal Barnes is a 70 y.o. female with a hx of CAD and hypercholesterolemia who relocated recently from [place] to [place].  Approximately 7 years ago she had angina pectoris described as a "elephant on my chest" and received a stent to the "front wall of the heart" and had another 60% blockage in a different vessel.  Since then she has done quite well and has had no cardiovascular complaints until about a month ago.  She developed a constant chest discomfort in the precordial area radiating slightly towards the axilla that lasted for about 4 days and resolved spontaneously.  It did not seem to be provoked or worsened by physical activity.  Now she is complaining of some brief twinges of discomfort lasting less than a minute in the same area but with a more sharp quality.  She underwent an echocardiogram and a stress test within the last couple of weeks, both of those with very reassuring results.  LVEF is normal and there are no significant valvular abnormalities or regional wall motion abnormalities.  She was able to exercise for 10 minutes almost 13 METs on the Bruce protocol without EKG changes or chest pain.  Although it was not reported as such, it appears that she had a hypertensive response to exercise.  She remains very active physically.  She walks for 30 minutes a day on her treadmill at 35 miles or hour and walks her dog.  She is very lean  with a BMI of only 20.  She stopped smoking when she was 70 years old with an approximately 25-pack-year history of smoking preceding that.  Her father had bypass surgery around the age of 22, her mother lived to age 47.  She was intolerant of atorvastatin which caused severe muscle pain, but is currently taking a combination of maximum dose rosuvastatin 40 mg and ezetimibe 10 mg once daily.  The latter medication was added about 6 months ago when she had labs that showed an LDL cholesterol of 94.  The patient specifically denies any chest pain worsened by exertion, dyspnea at rest or with exertion, orthopnea, paroxysmal nocturnal dyspnea, syncope, palpitations, focal neurological deficits, intermittent claudication, lower extremity edema, unexplained weight gain, cough, hemoptysis or wheezing.   Past Medical History:  Diagnosis Date  . Arthritis   . CAD (coronary artery disease)   . Cataract   . Hyperlipidemia   . Hypertension   . Hypothyroidism   . Myocardial infarction ([place])   . Thyroid disease     Past Surgical History:  Procedure Laterality Date  . ABDOMINAL HYSTERECTOMY  2008  . CATARACT EXTRACTION Bilateral   . CORONARY STENT PLACEMENT  2014  . ENDOSCOPIC MUCOSAL RESECTION N/A 11/24/2019   Procedure: ENDOSCOPIC MUCOSAL RESECTION;  Surgeon: Rush Landmark Telford Nab., MD;  Location: [facility];  Service: Gastroenterology;  Laterality: N/A;  . ENDOSCOPIC MUCOSAL RESECTION  08/04/2020  Procedure: ENDOSCOPIC MUCOSAL RESECTION;  Surgeon: Rush Landmark Telford Nab., MD;  Location: [facility];  Service: Gastroenterology;;  . Otho Darner SIGMOIDOSCOPY N/A 11/24/2019   Procedure: Beryle Quant;  Surgeon: Irving Copas., MD;  Location: [facility];  Service: Gastroenterology;  Laterality: N/A;  . FLEXIBLE SIGMOIDOSCOPY N/A 08/04/2020   Procedure: FLEXIBLE SIGMOIDOSCOPY;  Surgeon: Rush Landmark Telford Nab., MD;  Location: [facility];  Service: Gastroenterology;  Laterality: N/A;   . HEMOSTASIS CLIP PLACEMENT  11/24/2019   Procedure: HEMOSTASIS CLIP PLACEMENT;  Surgeon: Irving Copas., MD;  Location: [facility];  Service: Gastroenterology;;  . HEMOSTASIS CLIP PLACEMENT  08/04/2020   Procedure: HEMOSTASIS CLIP PLACEMENT;  Surgeon: Irving Copas., MD;  Location: [facility];  Service: Gastroenterology;;  . Lia F[place] LIFTING INJECTION  11/24/2019   Procedure: SUBMUCOSAL LIFTING INJECTION;  Surgeon: Irving Copas., MD;  Location: [facility];  Service: Gastroenterology;;  . [place] INJECTION  08/04/2020   Procedure: SUBMUCOSAL LIFTING INJECTION;  Surgeon: Irving Copas., MD;  Location: [facility];  Service: Gastroenterology;;  . TONSILLECTOMY      Current Medications: Current Meds  Medication Sig  . Calcium-Magnesium-Vitamin D (CALCIUM 1200+D3 PO) Take 1 tablet by mouth daily.  [name] doxylamine, Sleep, (UNISOM) 25 MG tablet Take 25 mg by mouth at bedtime as needed.  . ezetimibe (ZETIA) 10 MG tablet Take 1 tablet (10 mg total) by mouth daily.  [name] gabapentin (NEURONTIN) 300 MG capsule Take 1 capsule (300 mg total) by mouth 4 (four) times daily.  [name] GARLIC PO Take by mouth.  . levothyroxine (SYNTHROID) 88 MCG tablet Take 1 tablet (88 mcg total) by mouth daily before breakfast.  . losartan (COZAAR) 50 MG tablet Take 1 tablet (50 mg total) by mouth daily.  . Multiple Vitamin (MULTIVITAMIN WITH MINERALS) TABS tablet Take 1 tablet by mouth daily.  . Omega-3 Fatty Acids (FISH OIL) 1360 MG CAPS Take 1,360 mg by mouth at bedtime.  Vladimir Faster Glycol-Propyl Glycol (SYSTANE OP) Place 1 drop into both eyes daily as needed (Dry eyes).   . psyllium (REGULOID) 0.52 g capsule Take 0.52 g by mouth daily.  . rosuvastatin (CRESTOR) 40 MG tablet TAKE 1 TABLET BY MOUTH AT  BEDTIME  . traMADol (ULTRAM) 50 MG tablet TAKE TWO TABLETS BY MOUTH FOUR TIMES A DAY  . Turmeric 500 MG CAPS Take 500 mg by mouth at bedtime.  . [DISCONTINUED] aspirin EC 81  MG tablet Take 81 mg by mouth 2 (two) times daily.   Current Facility-Administered Medications for the 01/19/21 encounter (Office Visit) with Sanda Klein, MD  Medication  . 0.9 %  sodium chloride infusion     Allergies:   Patient has no known allergies.   Social History   Socioeconomic History  . Marital status: Single    Spouse name: Not on file  . Number of children: Not on file  . Years of education: Not on file  . Highest education level: Not on file  Occupational History  . Occupation: Retired     Comment: about a year and half ago  Tobacco Use  . Smoking status: Former Smoker    Years: 36.00    Types: Cigarettes  . Smokeless tobacco: Never Used  Vaping Use  . Vaping Use: Never used  Substance and Sexual Activity  . Alcohol use: Not Currently  . Drug use: Never  . Sexual activity: Not Currently  Other Topics Concern  . Not on file  Social History Narrative  . Not on file   Social  Determinants of Health   Financial Resource Strain: Low Risk   . Difficulty of Paying Living Expenses: Not hard at all  Food Insecurity: No Food Insecurity  . Worried About Charity fundraiser in the Last Year: Never true  . Ran Out of Food in the Last Year: Never true  Transportation Needs: No Transportation Needs  . Lack of Transportation (Medical): No  . Lack of Transportation (Non-Medical): No  Physical Activity: Sufficiently Active  . Days of Exercise per Week: 5 days  . Minutes of Exercise per Session: 60 min  Stress: No Stress Concern Present  . Feeling of Stress : Not at all  Social Connections: Moderately Isolated  . Frequency of Communication with Friends and Family: More than three times a week  . Frequency of Social Gatherings with Friends and Family: Three times a week  . Attends Religious Services: Never  . Active Member of Clubs or Organizations: Yes  . Attends Archivist Meetings: 1 to 4 times per year  . Marital Status: Never married     Family  History: The patient's family history includes Esophageal cancer in her father. There is no history of Colon cancer, Rectal cancer, Stomach cancer, Inflammatory bowel disease, Liver disease, or Pancreatic cancer.  ROS:   Please see the history of present illness.     All other systems reviewed and are negative.  EKGs/Labs/Other Studies Reviewed:    The following studies were reviewed today: ECHO  01/04/2021 1. Left ventricular ejection fraction by 3D volume is 68 %. The left  ventricle has normal function. The left ventricle has no regional wall  motion abnormalities. Left ventricular diastolic parameters were normal.  The average left ventricular global  longitudinal strain is -22.1 %. The global longitudinal strain is normal.  2. Right ventricular systolic function is normal. The right ventricular  size is normal. There is normal pulmonary artery systolic pressure. The  estimated right ventricular systolic pressure is 50.9 mmHg.  3. The mitral valve is normal in structure. Trivial mitral valve  regurgitation. No evidence of mitral stenosis.  4. The aortic valve is tricuspid. There is mild calcification of the  aortic valve. Aortic valve regurgitation is not visualized. No aortic  stenosis is present.  5. The inferior vena cava is normal in size with greater than 50%  respiratory variability, suggesting right atrial pressure of 3 mmHg.   Conclusion(s)/Recommendation(s): Normal biventricular function without  evidence of hemodynamically significant valvular heart disease.   STRESS TEST 01/06/2021  Exercise stress test: Clinically and electrically negative for ischemia.   Excellent exercise tolerance  Normal exercise stress test     EKG:  EKG is ordered today.  The ekg ordered today demonstrates NSR, normal tracing.  Recent Labs: 07/23/2020: ALT 27; BUN 18; Creat 0.57; Hemoglobin 13.1; Platelets 256; Potassium 5.4; Sodium 135 12/29/2020: TSH 2.07  Recent Lipid Panel     Component Value Date/Time   CHOL 152 01/19/2021 0938   TRIG 110 01/19/2021 0938   HDL 53 01/19/2021 0938   CHOLHDL 2.9 01/19/2021 0938   CHOLHDL 3.3 07/23/2020 1052   VLDL 20.6 07/14/2019 1028   LDLCALC 79 01/19/2021 0938   LDLCALC 94 07/23/2020 1052   Lipid profile today shows total cholesterol 152, HDL 53, LDL 79, triglycerides 110   Risk Assessment/Calculations:       Physical Exam:    VS:  BP 138/72 (BP Location: Left Arm, Patient Position: Sitting, Cuff Size: Normal)   Pulse 61   Ht 5'  2" (1.575 m)   Wt 111 lb (50.3 kg)   LMP  (LMP Unknown)   SpO2 92%   BMI 20.30 kg/m     Wt Readings from Last 3 Encounters:  01/19/21 111 lb (50.3 kg)  12/29/20 112 lb 6.4 oz (51 kg)  12/23/20 112 lb (50.8 kg)     GEN: Very lean and fit,  Well nourished, well developed in no acute distress HEENT: Normal NECK: No JVD; No carotid bruits LYMPHATICS: No lymphadenopathy CARDIAC: RRR, no murmurs, rubs, gallops RESPIRATORY:  Clear to auscultation without rales, wheezing or rhonchi  ABDOMEN: Soft, non-tender, non-distended MUSCULOSKELETAL:  No edema; No deformity  SKIN: Warm and dry NEUROLOGIC:  Alert and oriented x 3 PSYCHIATRIC:  Normal affect   ASSESSMENT:    1. Coronary artery disease involving native coronary artery of native heart without angina pectoris   2. Hypercholesterolemia   3. Essential hypertension   4. Acquired hypothyroidism    PLAN:    In order of problems listed above:  1. CAD: Symptoms of chest discomfort are highly atypical and not exertion related.  Her treadmill stress test was reassuringly normal.  She has normal LVEF.  Continue aspirin but can decrease to 81 mg once daily.  The focus is on risk factor mitigation, primarily LDL cholesterol.  I asked her to try to send [place] a copy of her stent card so that we can have accurate records of her previous intervention. 2. HLP: We checked labs today and her LDL cholesterol is still above target even after the  addition of ezetimibe.  She is already on the maximum dose of the most potent available statin.  She is doing an excellent job with diet and exercise and is very lean.  Recommend stopping the ezetimibe and starting Repatha 140 mg every 2 weeks.  Then recheck lipids after about 3 months. 3. HTN: Her blood pressure is mildly elevated today, but is typically 120/70 at home.  She had exactly that blood pressure at her last appointment with Dr. Eliberto Ivory.  No changes are made to her medications.  She has relative resting bradycardia and I doubt that she would tolerate a beta-blocker well.  No changes made to medications today. 4. Hypothyroidism: Recent TSH normal on current dose of levothyroxine.     Medication Adjustments/Labs and Tests Ordered: Current medicines are reviewed at length with the patient today.  Concerns regarding medicines are outlined above.  Orders Placed This Encounter  Procedures  . Lipid panel  . EKG 12-Lead   Meds ordered this encounter  Medications  . aspirin EC 81 MG tablet    Sig: Take 1 tablet (81 mg total) by mouth daily.    Dispense:  30 tablet    Patient Instructions  Medication Instructions:  No changes  *If you need a refill on your cardiac medications before your next appointment, please call your pharmacy*   Lab Work: Fasting lipid to be drawn today.   If you have labs (blood work) drawn today and your tests are completely normal, you will receive your results only by: [name] MyChart Message (if you have MyChart) OR . A paper copy in the mail If you have any lab test that is abnormal or we need to change your treatment, we will call you to review the results.   Testing/Procedures: None ordered.    Follow-Up: At [facility], you and your health needs are our priority.  As part of our continuing mission to provide you with exceptional heart  care, we have created designated Provider Care Teams.  These Care Teams include your primary Cardiologist (physician)  and Advanced Practice Providers (APPs -  Physician Assistants and Nurse Practitioners) who all work together to provide you with the care you need, when you need it.  We recommend signing up for the patient portal called "MyChart".  Sign up information is provided on this After Visit Summary.  MyChart is used to connect with patients for Virtual Visits (Telemedicine).  Patients are able to view lab/test results, encounter notes, upcoming appointments, etc.  Non-urgent messages can be sent to your provider as well.   To learn more about what you can do with MyChart, go to [email or web address].    Your next appointment:   12 month(s)  The format for your next appointment:   In Person  Provider:   Sanda Klein, MD        Signed, Sanda Klein, MD  01/21/2021 8:36 AM    [place]

## 2021-01-21 ENCOUNTER — Telehealth: Payer: Self-pay

## 2021-01-21 DIAGNOSIS — E782 Mixed hyperlipidemia: Secondary | ICD-10-CM

## 2021-01-21 MED ORDER — REPATHA SURECLICK 140 MG/ML ~~LOC~~ SOAJ: 140.0000 mg | SUBCUTANEOUS | 11 refills | Status: DC

## 2021-01-21 NOTE — Telephone Encounter (Signed)
Called and spoke w/pt regarding the approval of the repatha, rx sent, ;pt instructed to complete fasting labs in 2 months and they voiced understanding

## 2021-01-24 ENCOUNTER — Other Ambulatory Visit: Payer: Self-pay | Admitting: *Deleted

## 2021-01-24 NOTE — Telephone Encounter (Signed)
Croitoru, Mihai, MD  You 5 minutes ago (11:00 AM)    Can you show to Dr. Debara Pickett when he returns to see if she is a trail candidate.

## 2021-01-26 ENCOUNTER — Other Ambulatory Visit (HOSPITAL_COMMUNITY): Payer: Medicare Other

## 2021-02-04 ENCOUNTER — Encounter: Payer: Self-pay | Admitting: Family Medicine

## 2021-02-04 ENCOUNTER — Ambulatory Visit: Payer: Medicare Other | Admitting: Physical Therapy

## 2021-02-07 ENCOUNTER — Encounter: Payer: Self-pay | Admitting: Family Medicine

## 2021-02-08 NOTE — Telephone Encounter (Signed)
Pt requesting Tramadol 50 mg tab LOV: 12/29/2020 Next Visit: 02/16/2021 Last refill: 01/07/2021  Please Advise for refill.

## 2021-02-09 MED ORDER — TRAMADOL HCL 50 MG PO TABS: ORAL_TABLET | ORAL | 0 refills | Status: DC

## 2021-02-14 ENCOUNTER — Encounter: Payer: Self-pay | Admitting: Family Medicine

## 2021-02-16 ENCOUNTER — Ambulatory Visit: Payer: Medicare Other | Admitting: Family Medicine

## 2021-02-17 ENCOUNTER — Encounter: Payer: Self-pay | Admitting: Family Medicine

## 2021-02-25 ENCOUNTER — Encounter: Payer: Self-pay | Admitting: Family Medicine

## 2021-02-26 ENCOUNTER — Other Ambulatory Visit: Payer: Self-pay | Admitting: Family Medicine

## 2021-03-03 ENCOUNTER — Ambulatory Visit: Payer: Medicare Other | Admitting: Physical Therapy

## 2021-03-04 ENCOUNTER — Encounter: Payer: Self-pay | Admitting: Family Medicine

## 2021-03-04 ENCOUNTER — Telehealth: Payer: Self-pay | Admitting: Internal Medicine

## 2021-03-04 NOTE — Telephone Encounter (Signed)
Patient has called stating her provider is leaving and she would like to be a new patient of Dr.Burns, states her sister sees Dr.Burns Rande Brunt [phone]) and she recommended her.   I have informed the patient Dr.Burns is not accepting new patients, but I will ask for her to see if this is okay.   Please advise

## 2021-03-08 NOTE — Telephone Encounter (Signed)
Yes, I can accept her

## 2021-03-09 ENCOUNTER — Encounter: Payer: Self-pay | Admitting: Family Medicine

## 2021-03-09 ENCOUNTER — Encounter: Payer: Self-pay | Admitting: Internal Medicine

## 2021-03-09 NOTE — Telephone Encounter (Signed)
Pt is requesting Tramadol 50 mg tab LOV: 12/29/2020 NV: 03/21/2021 Transfer of Care w/ Billey Gosling.  Last refill: 02/09/2021  Please Advise.

## 2021-03-11 MED ORDER — TRAMADOL HCL 50 MG PO TABS: 100.0000 mg | ORAL_TABLET | Freq: Four times a day (QID) | ORAL | 0 refills | Status: DC

## 2021-03-11 NOTE — Telephone Encounter (Signed)
I reviewed chart.  Chronic pain TX: tramadol 100 qid.  Refilled 30 day supply.  Will have TOC visit 6/13 with Dr. Quay Burow.

## 2021-03-20 NOTE — Progress Notes (Signed)
Subjective:    Patient ID: Crystal Barnes, female    DOB: 30-May-1951, 70 y.o.   MRN: [id number]  HPI She is here to establish with a new pcp.  The patient is here for follow up of their chronic medical problems, including CAD, htn, hypothyroidism, osteopenia,  hyperlipidemia, chronic pain.   Chronic pain  - right labia. Pain controlled with current medication.  She walks daily.    Medications and allergies reviewed with patient and updated if appropriate.  Patient Active Problem List   Diagnosis Date Noted   History of rectal polypectomy 06/06/2020   Serrated adenoma of colon 06/06/2020   Hx of adenomatous colonic polyps 06/06/2020   Myocardial infarction ([place])    Arthralgia of both hands 06/26/2019   CAD (coronary artery disease) 02/10/2019   HTN (hypertension) 02/10/2019   Hypothyroidism (acquired) 02/10/2019   Hyperlipidemia, mixed 02/10/2019   Osteopenia 02/10/2019   RLS (restless legs syndrome) 02/10/2019   Chronic pain 02/10/2019    Current Outpatient Medications on File Prior to Visit  Medication Sig Dispense Refill   aspirin EC 81 MG tablet Take 1 tablet (81 mg total) by mouth daily. 30 tablet    Calcium-Magnesium-Vitamin D (CALCIUM 1200+D3 PO) Take 1 tablet by mouth daily.     doxylamine, Sleep, (UNISOM) 25 MG tablet Take 25 mg by mouth at bedtime as needed.     Evolocumab (REPATHA SURECLICK) 638 MG/ML SOAJ Inject 140 mg into the skin every 14 (fourteen) days. 2 mL 11   gabapentin (NEURONTIN) 300 MG capsule TAKE 1 CAPSULE BY MOUTH 4  TIMES DAILY 360 capsule 3   levothyroxine (SYNTHROID) 88 MCG tablet Take 1 tablet (88 mcg total) by mouth daily before breakfast. 90 tablet 3   losartan (COZAAR) 50 MG tablet Take 1 tablet (50 mg total) by mouth daily. 90 tablet 3   Multiple Vitamin (MULTIVITAMIN WITH MINERALS) TABS tablet Take 1 tablet by mouth daily.     Omega-3 Fatty Acids (FISH OIL) 1360 MG CAPS Take 1,360 mg by mouth at bedtime.     Polyethyl Glycol-Propyl  Glycol (SYSTANE OP) Place 1 drop into both eyes daily as needed (Dry eyes).      rosuvastatin (CRESTOR) 40 MG tablet TAKE 1 TABLET BY MOUTH AT  BEDTIME 90 tablet 3   traMADol (ULTRAM) 50 MG tablet Take 2 tablets (100 mg total) by mouth in the morning, at noon, in the evening, and at bedtime. 240 tablet 0   Turmeric 500 MG CAPS Take 500 mg by mouth at bedtime.     No current facility-administered medications on file prior to visit.    Past Medical History:  Diagnosis Date   Arthritis    CAD (coronary artery disease)    Cataract    Hyperlipidemia    Hypertension    Hypothyroidism    Myocardial infarction [facility])    Thyroid disease     Past Surgical History:  Procedure Laterality Date   ABDOMINAL HYSTERECTOMY  2008   CATARACT EXTRACTION Bilateral    CORONARY STENT PLACEMENT  2014   ENDOSCOPIC MUCOSAL RESECTION N/A 11/24/2019   Procedure: ENDOSCOPIC MUCOSAL RESECTION;  Surgeon: Irving Copas., MD;  Location: [facility];  Service: Gastroenterology;  Laterality: N/A;   ENDOSCOPIC MUCOSAL RESECTION  08/04/2020   Procedure: ENDOSCOPIC MUCOSAL RESECTION;  Surgeon: Rush Landmark Telford Nab., MD;  Location: [facility];  Service: Gastroenterology;;   Otho Darner SIGMOIDOSCOPY N/A 11/24/2019   Procedure: Beryle Quant;  Surgeon: Irving Copas., MD;  Location: [facility];  Service: Gastroenterology;  Laterality: N/A;   FLEXIBLE SIGMOIDOSCOPY N/A 08/04/2020   Procedure: FLEXIBLE SIGMOIDOSCOPY;  Surgeon: Rush Landmark Telford Nab., MD;  Location: [facility];  Service: Gastroenterology;  Laterality: N/A;   HEMOSTASIS CLIP PLACEMENT  11/24/2019   Procedure: HEMOSTASIS CLIP PLACEMENT;  Surgeon: Irving Copas., MD;  Location: [facility];  Service: Gastroenterology;;   HEMOSTASIS CLIP PLACEMENT  08/04/2020   Procedure: HEMOSTASIS CLIP PLACEMENT;  Surgeon: Irving Copas., MD;  Location: [facility];  Service: Gastroenterology;;   SUBMUCOSAL LIFTING INJECTION   11/24/2019   Procedure: SUBMUCOSAL LIFTING INJECTION;  Surgeon: Irving Copas., MD;  Location: [facility];  Service: Gastroenterology;;   SUBMUCOSAL LIFTING INJECTION  08/04/2020   Procedure: SUBMUCOSAL LIFTING INJECTION;  Surgeon: Irving Copas., MD;  Location: [facility];  Service: Gastroenterology;;   TONSILLECTOMY      Social History   Socioeconomic History   Marital status: Single    Spouse name: Not on file   Number of children: Not on file   Years of education: Not on file   Highest education level: Not on file  Occupational History   Occupation: Retired     Comment: about a year and half ago  Tobacco Use   Smoking status: Former    Years: 36.00    Pack years: 0.00    Types: Cigarettes   Smokeless tobacco: Never  Vaping Use   Vaping Use: Never used  Substance and Sexual Activity   Alcohol use: Not Currently   Drug use: Never   Sexual activity: Not Currently  Other Topics Concern   Not on file  Social History Narrative   Not on file   Social Determinants of Health   Financial Resource Strain: Low Risk    Difficulty of Paying Living Expenses: Not hard at all  Food Insecurity: No Food Insecurity   Worried About Charity fundraiser in the Last Year: Never true   [place] in the Last Year: Never true  Transportation Needs: No Transportation Needs   Lack of Transportation (Medical): No   Lack of Transportation (Non-Medical): No  Physical Activity: Sufficiently Active   Days of Exercise per Week: 5 days   Minutes of Exercise per Session: 60 min  Stress: No Stress Concern Present   Feeling of Stress : Not at all  Social Connections: Moderately Isolated   Frequency of Communication with Friends and Family: More than three times a week   Frequency of Social Gatherings with Friends and Family: Three times a week   Attends Religious Services: Never   Active Member of Clubs or Organizations: Yes   Attends Archivist Meetings: 1 to  4 times per year   Marital Status: Never married    Family History  Problem Relation Age of Onset   Esophageal cancer Father    Colon cancer Neg Hx    Rectal cancer Neg Hx    Stomach cancer Neg Hx    Inflammatory bowel disease Neg Hx    Liver disease Neg Hx    Pancreatic cancer Neg Hx     Review of Systems  Cardiovascular:  Negative for chest pain, palpitations and leg swelling.  Gastrointestinal:  Positive for constipation (controlled with metamucil). Negative for abdominal pain, blood in stool, diarrhea and nausea.  Neurological:  Negative for dizziness, light-headedness, numbness and headaches.      Objective:   Vitals:   03/21/21 1258  BP: 128/80  Pulse: 69  Temp: 98.7 F (37.1  C)  SpO2: 98%   BP Readings from Last 3 Encounters:  03/21/21 128/80  01/19/21 138/72  12/29/20 120/70   Wt Readings from Last 3 Encounters:  03/21/21 112 lb (50.8 kg)  01/19/21 111 lb (50.3 kg)  12/29/20 112 lb 6.4 oz (51 kg)   Body mass index is 20.49 kg/m.   Physical Exam    Constitutional: Appears well-developed and well-nourished. No distress.  HENT:  Head: Normocephalic and atraumatic.  Neck: Neck supple. No tracheal deviation present. No thyromegaly present.  No cervical lymphadenopathy Cardiovascular: Normal rate, regular rhythm and normal heart sounds.   No murmur heard. No carotid bruit .  No edema Pulmonary/Chest: Effort normal and breath sounds normal. No respiratory distress. No has no wheezes. No rales.  Skin: Skin is warm and dry. Not diaphoretic.  Psychiatric: Normal mood and affect. Behavior is normal.      Assessment & Plan:    See Problem List for Assessment and Plan of chronic medical problems.    This visit occurred during the SARS-CoV-2 public health emergency.  Safety protocols were in place, including screening questions prior to the visit, additional usage of staff PPE, and extensive cleaning of exam room while observing appropriate contact time as  indicated for disinfecting solutions.

## 2021-03-21 ENCOUNTER — Ambulatory Visit (INDEPENDENT_AMBULATORY_CARE_PROVIDER_SITE_OTHER): Payer: Medicare Other | Admitting: Internal Medicine

## 2021-03-21 ENCOUNTER — Encounter: Payer: Self-pay | Admitting: Internal Medicine

## 2021-03-21 ENCOUNTER — Other Ambulatory Visit: Payer: Self-pay

## 2021-03-21 VITALS — BP 128/80 | HR 69 | Temp 98.7°F | Ht 62.0 in | Wt 112.0 lb

## 2021-03-21 DIAGNOSIS — I2510 Atherosclerotic heart disease of native coronary artery without angina pectoris: Secondary | ICD-10-CM | POA: Diagnosis not present

## 2021-03-21 DIAGNOSIS — E782 Mixed hyperlipidemia: Secondary | ICD-10-CM | POA: Diagnosis not present

## 2021-03-21 DIAGNOSIS — I10 Essential (primary) hypertension: Secondary | ICD-10-CM | POA: Diagnosis not present

## 2021-03-21 DIAGNOSIS — G8929 Other chronic pain: Secondary | ICD-10-CM

## 2021-03-21 DIAGNOSIS — E039 Hypothyroidism, unspecified: Secondary | ICD-10-CM

## 2021-03-21 DIAGNOSIS — M8580 Other specified disorders of bone density and structure, unspecified site: Secondary | ICD-10-CM | POA: Diagnosis not present

## 2021-03-21 NOTE — Assessment & Plan Note (Signed)
Chronic On Repatha 140 mg q. 14 days, rosuvastatin 40 mg daily To have blood work done today by cardiology to check lipid panel

## 2021-03-21 NOTE — Assessment & Plan Note (Signed)
Chronic Nerve pain Continue tramadol 100 mg TID - Refill not needed Continue gabapentin 300 mg QID

## 2021-03-21 NOTE — Patient Instructions (Signed)
   It was nice to meet you.   Continue your current medications.

## 2021-03-21 NOTE — Assessment & Plan Note (Signed)
Chronic Status post stent 2015 No anginal-like symptoms Following with Dr. Loletha Grayer Continue Repatha, losartan 50 mg daily, rosuvastatin 40 mg daily, aspirin 81 mg daily

## 2021-03-21 NOTE — Assessment & Plan Note (Signed)
Chronic BP well controlled Continue losartan 50 mg daily 

## 2021-03-21 NOTE — Assessment & Plan Note (Signed)
Chronic Euthyroid Continue levothyroxine 88 mcg daily

## 2021-03-21 NOTE — Assessment & Plan Note (Signed)
Chronic DEXA up-to-date Walking daily Taking calcium and vitamin D

## 2021-03-22 DIAGNOSIS — E782 Mixed hyperlipidemia: Secondary | ICD-10-CM

## 2021-03-22 LAB — HEPATIC FUNCTION PANEL
ALT: 45 IU/L — ABNORMAL HIGH (ref 0–32)
AST: 44 IU/L — ABNORMAL HIGH (ref 0–40)
Albumin: 4.5 g/dL (ref 3.8–4.8)
Alkaline Phosphatase: 68 IU/L (ref 44–121)
Bilirubin Total: 0.3 mg/dL (ref 0.0–1.2)
Bilirubin, Direct: 0.14 mg/dL (ref 0.00–0.40)
Total Protein: 6.2 g/dL (ref 6.0–8.5)

## 2021-03-22 LAB — LIPID PANEL
Chol/HDL Ratio: 1.7 ratio (ref 0.0–4.4)
Cholesterol, Total: 96 mg/dL — ABNORMAL LOW (ref 100–199)
HDL: 56 mg/dL (ref >39)
LDL Chol Calc (NIH): 22 mg/dL (ref 0–99)
Triglycerides: 97 mg/dL (ref 0–149)
VLDL Cholesterol Cal: 18 mg/dL (ref 5–40)

## 2021-03-22 MED ORDER — ROSUVASTATIN CALCIUM 40 MG PO TABS
20.0000 mg | ORAL_TABLET | Freq: Every day | ORAL | 3 refills | Status: DC
Start: 2021-03-22 — End: 2021-03-22

## 2021-03-22 MED ORDER — ROSUVASTATIN CALCIUM 20 MG PO TABS: 20.0000 mg | ORAL_TABLET | Freq: Every day | ORAL | 1 refills | Status: DC

## 2021-03-22 NOTE — Addendum Note (Signed)
Addended by: Fidel Levy on: 03/22/2021 02:07 PM   Modules accepted: Orders

## 2021-04-04 ENCOUNTER — Ambulatory Visit: Payer: Medicare Other

## 2021-04-08 ENCOUNTER — Other Ambulatory Visit: Payer: Self-pay | Admitting: Family Medicine

## 2021-04-08 ENCOUNTER — Encounter: Payer: Self-pay | Admitting: Internal Medicine

## 2021-04-09 MED ORDER — TRAMADOL HCL 50 MG PO TABS: 100.0000 mg | ORAL_TABLET | Freq: Four times a day (QID) | ORAL | 0 refills | Status: DC

## 2021-04-27 ENCOUNTER — Ambulatory Visit: Payer: Medicare Other | Attending: Physical Medicine & Rehabilitation | Admitting: Physical Therapy

## 2021-04-27 ENCOUNTER — Encounter: Payer: Self-pay | Admitting: Physical Therapy

## 2021-04-27 ENCOUNTER — Other Ambulatory Visit: Payer: Self-pay

## 2021-04-27 DIAGNOSIS — R252 Cramp and spasm: Secondary | ICD-10-CM | POA: Diagnosis not present

## 2021-04-27 DIAGNOSIS — M6281 Muscle weakness (generalized): Secondary | ICD-10-CM | POA: Insufficient documentation

## 2021-04-27 DIAGNOSIS — R102 Pelvic and perineal pain: Secondary | ICD-10-CM | POA: Insufficient documentation

## 2021-04-27 NOTE — Therapy (Signed)
[facility] [address] [place], [place], [address] Phone: [phone]   Fax:  [phone]  Physical Therapy Treatment  Patient Details  Name: Crystal Barnes MRN: [id number] Date of Birth: Nov 15, 1950 Referring Provider (PT): Dr. Alysia Penna   Encounter Date: 04/27/2021   PT End of Session - 04/27/21 1645     Visit Number 1    Date for PT Re-Evaluation 07/20/21    Authorization Type [employer] medicare    Authorization - Number of Visits 1    Progress Note Due on Visit 10    PT Start Time 1015    PT Stop Time 1100    PT Time Calculation (min) 45 min    Activity Tolerance Patient tolerated treatment well;No increased pain    Behavior During Therapy WFL for tasks assessed/performed             Past Medical History:  Diagnosis Date   Arthritis    CAD (coronary artery disease)    Cataract    Hyperlipidemia    Hypertension    Hypothyroidism    Myocardial infarction [facility])    Thyroid disease     Past Surgical History:  Procedure Laterality Date   ABDOMINAL HYSTERECTOMY  2008   CATARACT EXTRACTION Bilateral    CORONARY STENT PLACEMENT  2014   ENDOSCOPIC MUCOSAL RESECTION N/A 11/24/2019   Procedure: ENDOSCOPIC MUCOSAL RESECTION;  Surgeon: Irving Copas., MD;  Location: [place];  Service: Gastroenterology;  Laterality: N/A;   ENDOSCOPIC MUCOSAL RESECTION  08/04/2020   Procedure: ENDOSCOPIC MUCOSAL RESECTION;  Surgeon: Rush Landmark Telford Nab., MD;  Location: [facility];  Service: Gastroenterology;;   Otho Darner SIGMOIDOSCOPY N/A 11/24/2019   Procedure: Beryle Quant;  Surgeon: Irving Copas., MD;  Location: [place];  Service: Gastroenterology;  Laterality: N/A;   FLEXIBLE SIGMOIDOSCOPY N/A 08/04/2020   Procedure: FLEXIBLE SIGMOIDOSCOPY;  Surgeon: Rush Landmark Telford Nab., MD;  Location: [facility];  Service: Gastroenterology;  Laterality: N/A;   HEMOSTASIS CLIP PLACEMENT   11/24/2019   Procedure: HEMOSTASIS CLIP PLACEMENT;  Surgeon: Irving Copas., MD;  Location: [place];  Service: Gastroenterology;;   HEMOSTASIS CLIP PLACEMENT  08/04/2020   Procedure: HEMOSTASIS CLIP PLACEMENT;  Surgeon: Irving Copas., MD;  Location: [facility];  Service: Gastroenterology;;   SUBMUCOSAL LIFTING INJECTION  11/24/2019   Procedure: SUBMUCOSAL LIFTING INJECTION;  Surgeon: Irving Copas., MD;  Location: [place];  Service: Gastroenterology;;   SUBMUCOSAL LIFTING INJECTION  08/04/2020   Procedure: SUBMUCOSAL LIFTING INJECTION;  Surgeon: Irving Copas., MD;  Location: [facility];  Service: Gastroenterology;;   TONSILLECTOMY      There were no vitals filed for this visit.   Subjective Assessment - 04/27/21 1015     Subjective Right sided pelvic pain along the vulvar area. Pain has started in 1998. Sitting and felt a pain from her head to the pelvis and inflammed the pelvis. Patient had a history of bulimia. Patient has had a nerve block, accupuncture, PT. Patient does kegels daily. PT felt an area that is tight.    Patient Stated Goals reduce pain    Currently in Pain? Yes    Pain Score 6    with medication 1-2/10   Pain Location Pelvis    Pain Orientation Right    Pain Descriptors / Indicators Sharp    Pain Type Chronic pain    Pain Radiating Towards can radiate to the mid inner thigh    Pain Onset More than a month ago  Pain Frequency Constant    Aggravating Factors  stress    Pain Relieving Factors medication, exercise including walking,  hot bath, phsychologist with mental health                [facility] PT Assessment - 04/27/21 0001       Assessment   Medical Diagnosis M79.18 Myalgia of pelvic floor    Referring Provider (PT) Dr. Alysia Penna    Onset Date/Surgical Date --   1988   Prior Therapy PT in [place]      Precautions   Precautions None      Restrictions   Weight Bearing Restrictions No       Balance Screen   Has the patient fallen in the past 6 months No    Has the patient had a decrease in activity level because of a fear of falling?  No    Is the patient reluctant to leave their home because of a fear of falling?  No      Prior Function   Level of Independence Independent    Vocation Retired;Part time employment    Leisure walking      Cognition   Overall Cognitive Status Within Functional Limits for tasks assessed      Posture/Postural Control   Posture/Postural Control No significant limitations      ROM / Strength   AROM / PROM / Strength AROM;PROM;Strength      AROM   Overall AROM Comments lumbar ROM is full      Strength   Right Hip Flexion 5/5    Right Hip Extension 3+/5    Left Hip Flexion 4/5    Left Hip Extension 4/5    Left Hip ABduction 4/5    Left Hip ADduction 4/5      Palpation   SI assessment  right ilium lower than the left                        Pelvic Floor Special Questions - 04/27/21 0001     Prior Pregnancies No    Currently Sexually Active No    Urinary Leakage Yes    Activities that cause leaking With strong urge;Other    Other activities that cause leaking after she urinates    Urinary frequency Difficulty with fully emptying her bladder    Fecal incontinence Yes   strain to have a bowel movement sometimes, taking metamucil   Skin Integrity Intact;Other    Skin Integrity other dryness    Perineal Body/Introitus  Elevated    Pelvic Floor Internal Exam Patient confirms identification and approves PT to assess pelvic floor and treatment    Exam Type Vaginal    Palpation tenderness on the left lower half of the introitus, puborectalis, pubococcygeus and left perineal body and left superior transverse perineum    Strength strong squeeze, against strong resistance                       PT Education - 04/27/21 1631     Education Details educated patient on perineal massage to the left side of the  vaginal area to improve tissue mobility    Person(s) Educated Patient    Methods Explanation;Demonstration    Comprehension Verbalized understanding              PT Short Term Goals - 04/27/21 1701       PT SHORT TERM GOAL #1   Title  independent with initial HEP    Time 4    Period Weeks    Status New    Target Date 05/25/21      PT SHORT TERM GOAL #2   Title pelvic pain decreased >/= 25% due to improve tissue mobility    Time 4    Period Weeks    Status New    Target Date 05/25/21               PT Long Term Goals - 04/27/21 1701       PT LONG TERM GOAL #1   Title independent with advanced HEP for core strength and hip strength    Time 12    Status New    Target Date 07/20/21      PT LONG TERM GOAL #2   Title able to fully empty her bladder due to full relaxation of the pelvic floor    Time 12    Period Weeks    Status New    Target Date 07/20/21      PT LONG TERM GOAL #3   Title pelvic pain decreased >/= 75% due to reduction of trigger points in the pelvic floor muscles    Time 12    Period Days    Status New    Target Date 07/20/21      PT LONG TERM GOAL #4   Title urinary leakage decreased >/= 75% due to improved coordination of the pelvic floor    Time 12    Period Weeks    Status New    Target Date 07/20/21                   Plan - 04/27/21 1649     Clinical Impression Statement Patient is a 70 year old female with chronic pelvic pain since 1988 with sudden onset. Patient reports her pain level is 6/10 without medication and 1-2/10 with medication. Her pain is worse with stress and better with walking, and medication. Left hip strength is 4/5 and right ilium is lower. Patient will leak urine with a strong urge and after she urinates. She ahs difficulty wiht fully emptying bladder. Patient reports she will have fecal leakage at times and constipation. Pelvic floor strength is 5/5. She has vaginal dryness and erythema along the  introitus. The perineal body is elevated. She has tenderness located on the left perineal body, superior transverse perineum, the left side of the puborectalis, and pubococcygeus. Patient will benefit from skilled therapy to reduce pain and improve pelvic floor coordination and quality of life.    Personal Factors and Comorbidities Comorbidity 3+    Comorbidities abdominal hysterectomy; CAD; Coronary stent replacement 2014; myocardial infarction    Examination-Activity Limitations Continence    Examination-Participation Restrictions Community Activity    Stability/Clinical Decision Making Stable/Uncomplicated    Clinical Decision Making Low    Rehab Potential Excellent    PT Frequency 1x / week    PT Duration 12 weeks    PT Treatment/Interventions ADLs/Self Care Home Management;Biofeedback;Cryotherapy;Electrical Stimulation;Moist Heat;Ultrasound;Therapeutic activities;Therapeutic exercise;Neuromuscular re-education;Patient/family education;Manual techniques;Dry needling    PT Next Visit Plan work on left side of the vaginal canal; discuss moisturizers, hip stretches to stretch the left side of pelvic floor, stretch left hip;. left hip strength    Consulted and Agree with Plan of Care Patient             Patient will benefit from skilled therapeutic intervention in order to improve the following deficits  and impairments:  Decreased coordination, Increased fascial restricitons, Pain, Decreased strength, Decreased mobility, Decreased activity tolerance  Visit Diagnosis: Muscle weakness (generalized) - Plan: PT plan of care cert/re-cert  Cramp and spasm - Plan: PT plan of care cert/re-cert  Pelvic pain - Plan: PT plan of care cert/re-cert     Problem List Patient Active Problem List   Diagnosis Date Noted   History of rectal polypectomy 06/06/2020   Serrated adenoma of colon 06/06/2020   Hx of adenomatous colonic polyps 06/06/2020   Myocardial infarction ([place])    Arthralgia of both  hands 06/26/2019   CAD (coronary artery disease) 02/10/2019   HTN (hypertension) 02/10/2019   Hypothyroidism (acquired) 02/10/2019   Hyperlipidemia, mixed 02/10/2019   Osteopenia 02/10/2019   RLS (restless legs syndrome) 02/10/2019   Chronic pain 02/10/2019    Earlie Counts, PT 04/27/21 5:08 PM  [facility] [address] [place], [place], [address] Phone: [phone]   Fax:  [phone]  Name: Crystal Barnes MRN: [id number] Date of Birth: Sep 08, 1951

## 2021-04-27 NOTE — Patient Instructions (Signed)
Crystal Shams, MD No reviews  Urologist [address] [phone] Medicare accepted Does trigger point injections    [facility] [address] [place], [place] [address] [phone] [email or web address]

## 2021-05-09 ENCOUNTER — Encounter: Payer: Self-pay | Admitting: Internal Medicine

## 2021-05-10 MED ORDER — TRAMADOL HCL 50 MG PO TABS: 100.0000 mg | ORAL_TABLET | Freq: Four times a day (QID) | ORAL | 0 refills | Status: DC

## 2021-05-11 ENCOUNTER — Telehealth: Payer: Self-pay

## 2021-05-11 NOTE — Telephone Encounter (Signed)
Crystal Barnes is a 70 y.o. female was called and contacted re: New pt Pre appt call to collect history information. Pt verified using 2 identifiers. Confirmation that I am speaking with the correct person.  Chart was updated: -Allergy -Medication -Confirm pharmacy -OB history  Pt was notified to arrive 15 min early and we will need a urine sample when she arrives. Pt verbalized understanding.

## 2021-05-12 ENCOUNTER — Ambulatory Visit (INDEPENDENT_AMBULATORY_CARE_PROVIDER_SITE_OTHER): Payer: Medicare Other | Admitting: Obstetrics and Gynecology

## 2021-05-12 ENCOUNTER — Encounter: Payer: Self-pay | Admitting: Obstetrics and Gynecology

## 2021-05-12 ENCOUNTER — Other Ambulatory Visit: Payer: Self-pay

## 2021-05-12 VITALS — BP 136/85 | HR 65 | Ht 63.0 in | Wt 112.0 lb

## 2021-05-12 DIAGNOSIS — R102 Pelvic and perineal pain: Secondary | ICD-10-CM | POA: Diagnosis not present

## 2021-05-12 DIAGNOSIS — R3915 Urgency of urination: Secondary | ICD-10-CM

## 2021-05-12 DIAGNOSIS — M62838 Other muscle spasm: Secondary | ICD-10-CM

## 2021-05-12 MED ORDER — NONFORMULARY OR COMPOUNDED ITEM: 5 refills | Status: DC

## 2021-05-12 NOTE — Progress Notes (Signed)
[facility] New Patient Evaluation and Consultation  Referring Provider: Binnie Rail, MD PCP: Binnie Rail, MD Date of Service: 05/12/2021  SUBJECTIVE Chief Complaint: New Patient (Initial Visit) Crystal Barnes is a 70 y.o. female here for a consult on pelvic pain for thee past 30 years./)  History of Present Illness: Crystal Barnes is a 70 y.o. White or Caucasian female presenting for evaluation of pelvic pain.     Urinary Symptoms: Leaks urine with going from sitting to standing with urgency Leaks 1 time(s) every few days Pad use: none She is bothered by her UI symptoms.  Day time voids 4.  Nocturia: 3 times per night to void. Voiding dysfunction: she empties her bladder well.  does not use a catheter to empty bladder.  When urinating, she feels difficulty starting urine stream and to push on her belly or vagina to empty bladder.  Sometimes has some urinary hesitancy.  Drinks: 3 cups coffee, 1 cup tea, 2-16oz glasses water per day. Drinks caffeine throughout day and drinks up until bedtime.   UTIs:  0  UTI's in the last year.   Denies history of blood in urine and kidney or bladder stones  Pelvic Organ Prolapse Symptoms:                  She Denies a feeling of a bulge the vaginal area.   Bowel Symptom: Bowel movements: 1 time(s) per day Stool consistency: soft  Straining: yes, sometimes Splinting: no.  Incomplete evacuation: no.  She Denies accidental bowel leakage / fecal incontinence Bowel regimen: fiber- metamucil Last colonoscopy: Date 2021, Results: polyp removed  Sexual Function Sexually active: no.   Pelvic Pain Admits to pelvic pain Location: right side of the vulva Pain occurs: constantly Prior pain treatment: seen by PT- Earlie Counts Improved by: exercise, medications Worsened by: stress  Has a knot on the lip of the vulva that has been painful for years. Takes tramadol and neurontin, which does help but pain is still there. Has  also tried cryotherapy, acupuncture, a nerve block.    Past Medical History:  Past Medical History:  Diagnosis Date   Arthritis    CAD (coronary artery disease)    Cataract    Hyperlipidemia    Hypertension    Hypothyroidism    Myocardial infarction [facility])    Thyroid disease      Past Surgical History:   Past Surgical History:  Procedure Laterality Date   ABDOMINAL HYSTERECTOMY  2008   CATARACT EXTRACTION Bilateral    CORONARY STENT PLACEMENT  2014   ENDOSCOPIC MUCOSAL RESECTION N/A 11/24/2019   Procedure: ENDOSCOPIC MUCOSAL RESECTION;  Surgeon: Irving Copas., MD;  Location: [place];  Service: Gastroenterology;  Laterality: N/A;   ENDOSCOPIC MUCOSAL RESECTION  08/04/2020   Procedure: ENDOSCOPIC MUCOSAL RESECTION;  Surgeon: Rush Landmark Telford Nab., MD;  Location: [facility];  Service: Gastroenterology;;   Otho Darner SIGMOIDOSCOPY N/A 11/24/2019   Procedure: Beryle Quant;  Surgeon: Irving Copas., MD;  Location: [place];  Service: Gastroenterology;  Laterality: N/A;   FLEXIBLE SIGMOIDOSCOPY N/A 08/04/2020   Procedure: FLEXIBLE SIGMOIDOSCOPY;  Surgeon: Rush Landmark Telford Nab., MD;  Location: [facility];  Service: Gastroenterology;  Laterality: N/A;   HEMOSTASIS CLIP PLACEMENT  11/24/2019   Procedure: HEMOSTASIS CLIP PLACEMENT;  Surgeon: Irving Copas., MD;  Location: [place];  Service: Gastroenterology;;   HEMOSTASIS CLIP PLACEMENT  08/04/2020   Procedure: HEMOSTASIS CLIP PLACEMENT;  Surgeon: Irving Copas., MD;  Location[facility];  Service: Gastroenterology;;   SUBMUCOSAL LIFTING INJECTION  11/24/2019   Procedure: SUBMUCOSAL LIFTING INJECTION;  Surgeon: Irving Copas., MD;  Location: [place];  Service: Gastroenterology;;   Lia Foyer LIFTING INJECTION  08/04/2020   Procedure: SUBMUCOSAL LIFTING INJECTION;  Surgeon: Irving Copas., MD;  Location: [facility];  Service: Gastroenterology;;    TONSILLECTOMY       Past OB/GYN History: OB History  Gravida Para Term Preterm AB Living  0 0 0 0 0 0  SAB IAB Ectopic Multiple Live Births  0 0 0 0 0   S/p hysterectomy   Medications: She has a current medication list which includes the following prescription(s): NONFORMULARY OR COMPOUNDED ITEM, aspirin ec, calcium-magnesium-vitamin d, doxylamine (sleep), repatha sureclick, gabapentin, levothyroxine, losartan, multivitamin with minerals, fish oil, polyethyl glycol-propyl glycol, rosuvastatin, tramadol, and turmeric.   Allergies: Patient has No Known Allergies.   Social History:  Social History   Tobacco Use   Smoking status: Former    Years: 36.00    Types: Cigarettes   Smokeless tobacco: Never  Vaping Use   Vaping Use: Never used  Substance Use Topics   Alcohol use: Not Currently   Drug use: Never    Relationship status: single She lives with self.   She is employed as a part time caregiver. Regular exercise: Yes: walking History of abuse: No  Family History:   Family History  Problem Relation Age of Onset   Esophageal cancer Father    Colon cancer Neg Hx    Rectal cancer Neg Hx    Stomach cancer Neg Hx    Inflammatory bowel disease Neg Hx    Liver disease Neg Hx    Pancreatic cancer Neg Hx      Review of Systems: Review of Systems  Constitutional:  Negative for fever, malaise/fatigue and weight loss.  Respiratory:  Negative for cough, shortness of breath and wheezing.   Cardiovascular:  Negative for chest pain, palpitations and leg swelling.  Gastrointestinal:  Negative for abdominal pain and blood in stool.  Genitourinary:  Negative for dysuria.  Musculoskeletal:  Negative for myalgias.  Skin:  Negative for rash.  Neurological:  Negative for dizziness and headaches.  Endo/Heme/Allergies:  Does not bruise/bleed easily.  Psychiatric/Behavioral:  Negative for depression. The patient is not nervous/anxious.     OBJECTIVE Physical Exam: Vitals:    05/12/21 0832  BP: 136/85  Pulse: 65  Weight: 112 lb (50.8 kg)  Height: '5\' 3"'$  (1.6 m)    Physical Exam Constitutional:      General: She is not in acute distress. Pulmonary:     Effort: Pulmonary effort is normal.  Abdominal:     General: There is no distension.     Palpations: Abdomen is soft.     Tenderness: There is no abdominal tenderness. There is no rebound.  Musculoskeletal:        General: No swelling. Normal range of motion.  Skin:    General: Skin is warm and dry.     Findings: No rash.  Neurological:     Mental Status: She is alert and oriented to person, place, and time.  Psychiatric:        Mood and Affect: Mood normal.        Behavior: Behavior normal.     GU / Detailed Urogynecologic Evaluation:  Pelvic Exam: Normal external female genitalia; Bartholin's and Skene's glands normal in appearance; urethral meatus normal in appearance, no urethral masses or discharge.   CST: negative  Q tip test: allodynia at introitus from 3 to 9 o'clock, allodynia on the right vulva- inside labia minora  s/p hysterectomy: Speculum exam reveals normal vaginal mucosa with  atrophy and normal vaginal cuff.      Pelvic floor strength II/V  Pelvic floor musculature: + high tone, esp on right. Puborectalis tender, Right levator tender, Right obturator tender, Left levator tender, Left obturator non-tender  POP-Q:  deferred, no prolapse    Rectal Exam:  Normal external rectum  Post-Void Residual (PVR) by Bladder Scan: In order to evaluate bladder emptying, we discussed obtaining a postvoid residual and she agreed to this procedure. Unable to void to give urine sample  Procedure: The ultrasound unit was placed on the patient's abdomen in the suprapubic region after the patient had voided. A PVR of 116 ml was obtained by bladder scan.  Laboratory Results: Unable to void  ASSESSMENT AND PLAN Ms. Silas is a 70 y.o. with:  1. Levator spasm   2. Vulvar pain   3. Urinary  urgency    Levator spasm/ vulvar pain -The origin of pelvic floor muscle spasm can be multifactorial, including primary, reactive to a different pain source, trauma, or even part of a centralized pain syndrome.Treatment options include pelvic floor physical therapy, local (vaginal) or oral  muscle relaxants, pelvic muscle trigger point injections or centrally acting pain medications. - Currently undergoing physical therapy, continue    - Will prescribe amitiptyline/ gabapentin/ baclofen compounded cream vaginally 1g BID. She is potentially interested in trigger point injections but would like to see how physical therapy works for her first.   2. Urinary urgency - We discussed decreasing bladder irritant such as coffee, tea and not drinking caffeine in the afternoon. She should also try to avoid drinking a few hours prior to bedtime.   Return 3 months or sooner if needed   Jaquita Folds, MD   Time spent: I spent 50 minutes dedicated to the care of this patient on the date of this encounter to include pre-visit review of records, face-to-face time with the patient and post visit documentation and ordering medication/ testing.

## 2021-05-12 NOTE — Patient Instructions (Signed)
I will prescribe a cream compounded with elavil/ gabapentin/ baclofen that [facility].   Consider trigger point injections.

## 2021-05-18 ENCOUNTER — Other Ambulatory Visit: Payer: Self-pay | Admitting: Family Medicine

## 2021-05-19 ENCOUNTER — Encounter: Payer: Self-pay | Admitting: Physical Therapy

## 2021-05-19 ENCOUNTER — Other Ambulatory Visit: Payer: Self-pay

## 2021-05-19 ENCOUNTER — Ambulatory Visit: Payer: Medicare Other | Attending: Physical Medicine & Rehabilitation | Admitting: Physical Therapy

## 2021-05-19 DIAGNOSIS — R102 Pelvic and perineal pain: Secondary | ICD-10-CM | POA: Insufficient documentation

## 2021-05-19 DIAGNOSIS — R252 Cramp and spasm: Secondary | ICD-10-CM | POA: Insufficient documentation

## 2021-05-19 DIAGNOSIS — M6281 Muscle weakness (generalized): Secondary | ICD-10-CM | POA: Insufficient documentation

## 2021-05-19 NOTE — Patient Instructions (Addendum)
Lubrication Used for intercourse to reduce friction Avoid ones that have glycerin, warming gels, tingling gels, icing or cooling gel, scented Avoid parabens due to a preservative similar to female sex hormone May need to be reapplied once or several times during sexual activity Can be applied to both partners genitals prior to vaginal penetration to minimize friction or irritation Prevent irritation and mucosal tears that cause post coital pain and increased the risk of vaginal and urinary tract infections Oil-based lubricants cannot be used with condoms due to breaking them down.  Least likely to irritate vaginal tissue.  Plant based-lubes are safe Silicone-based lubrication are thicker and last long and used for post-menopausal women  Vaginal Lubricators Here is a list of some suggested lubricators you can use for intercourse. Use the most hypoallergenic product.  You can place on you or your partner.  Slippery Stuff ( water based) Sylk or Sliquid Natural H2O ( good  if frequent UTI's)- walmart, amazon Sliquid organics silk-(aloe and silicone based ) [employer] (www.blossom-organics.com)- (aloe based ) Coconut oil, olive oil -not good with condoms  PJur Woman Nude- (water based) amazon Uberlube- ( silicon) [place] has an organic one; use with vaginal wand Yes lubricant- (water based and has plant oil based similar to silicone) [employer] Platinum-Silicone, Target, [facility] Olive and Bee intimate cream-  www.oliveandbee.com.au Pink - [employer] stuff Erosense Sync- walmart, amazon Coconu- FailLinks.co.uk  Things to avoid in lubricants are glycerin, warming gels, tingling gels, icing or cooling  gels, and scented gels.  Also avoid Vaseline. KY jelly, Replens, and Astroglide contain chlorhexidine which kills good bacteria(lactobacilli)  Things to avoid in the vaginal area Do not use things to irritate the vulvar area No lotions- see below Soaps you  can use  :Aveeno, [place], Good Clean Love cleanser if needed. Must be gentle No deodorants No douches Good to sleep without underwear to let the vaginal area to air out No scrubbing: spread the lips to let warm water rinse over labias and pat dry  Creams that can be used on the Vulva Area V [employer], walmart Vital V [place] Salve Julva- [employer] Botanical Pro-Meno Wild Yam Cream Coconut oil, olive oil Cleo by [employer] labial moisturizer -Amazon,  Desert [place] Releveum ( lidocaine) or Desert [employer] Yes Moisturizer    Bladder Irritants  Certain foods and beverages can be irritating to the bladder.  Avoiding these irritants may decrease your symptoms of urinary urgency, frequency or bladder pain.  Even reducing your intake can help with your symptoms.  Not everyone is sensitive to all bladder irritants, so you may consider focusing on one irritant at a time, removing or reducing your intake of that irritant for 7-10 days to see if this change helps your symptoms.  Water intake is also very important.  Below is a list of bladder irritants.  Drinks: alcohol, carbonated beverages, caffeinated beverages such as coffee and tea, drinks with artificial sweeteners, citrus juices, apple juice, tomato juice  Foods: tomatoes and tomato based foods, spicy food, sugar and artificial sweeteners, vinegar, chocolate, raw onion, apples, citrus fruits, pineapple, cranberries, tomatoes, strawberries, plums, peaches, cantaloupe  Other: acidic urine (too concentrated) - see water intake info below  Substitutes you can try that are NOT irritating to the bladder: cooked onion, pears, papayas, sun-brewed decaf teas, watermelons, non-citrus herbal teas, apricots, kava and low-acid instant drinks (Postum).    WATER INTAKE: Remember to drink lots of water (aim for fluid intake of half your  body weight with 2/3 of fluids being water).  You may be limiting fluids due to fear of leakage, but this can  actually worsen urgency symptoms due to highly concentrated urine.  Water helps balance the pH of your urine so it doesn't become too acidic - acidic urine is a bladder irritant! [facility] [address] [place], [place] [address] Phone # [phone] Fax [phone]'  Access Code: [email or web address] URL: [email or web address] Date: 05/19/2021 Prepared by: Earlie Counts  Exercises Supine Hamstring Stretch - 1 x daily - 7 x weekly - 1 sets - 2 reps - 30 sec hold Pigeon Pose - 1 x daily - 7 x weekly - 1 sets - 2 reps - 30 sec hold V Sit Hip Adductor Hamstring Stretch - 1 x daily - 7 x weekly - 1 sets - 2 reps - 30 sec hold Half Kneeling Hip Flexor Stretch with Sidebend - 1 x daily - 7 x weekly - 1 sets - 2 reps - 30 sec hold

## 2021-05-19 NOTE — Therapy (Signed)
[facility] [address] [place], [place], [address] Phone: [phone]   Fax:  [phone]  Physical Therapy Treatment  Patient Details  Name: Crystal Barnes MRN: [id number] Date of Birth: May 03, 1951 Referring Provider (PT): Dr. Alysia Penna   Encounter Date: 05/19/2021   PT End of Session - 05/19/21 1213     Visit Number 2    Date for PT Re-Evaluation 07/20/21    Authorization Type [employer] medicare    Authorization - Number of Visits 2    Progress Note Due on Visit 10    PT Start Time 1216    PT Stop Time 1300    PT Time Calculation (min) 44 min    Activity Tolerance Patient tolerated treatment well;No increased pain    Behavior During Therapy WFL for tasks assessed/performed             Past Medical History:  Diagnosis Date   Arthritis    CAD (coronary artery disease)    Cataract    Hyperlipidemia    Hypertension    Hypothyroidism    Myocardial infarction [facility])    Thyroid disease     Past Surgical History:  Procedure Laterality Date   ABDOMINAL HYSTERECTOMY  2008   CATARACT EXTRACTION Bilateral    CORONARY STENT PLACEMENT  2014   ENDOSCOPIC MUCOSAL RESECTION N/A 11/24/2019   Procedure: ENDOSCOPIC MUCOSAL RESECTION;  Surgeon: Irving Copas., MD;  Location: [facility];  Service: Gastroenterology;  Laterality: N/A;   ENDOSCOPIC MUCOSAL RESECTION  08/04/2020   Procedure: ENDOSCOPIC MUCOSAL RESECTION;  Surgeon: Rush Landmark Telford Nab., MD;  Location: [facility];  Service: Gastroenterology;;   Otho Darner SIGMOIDOSCOPY N/A 11/24/2019   Procedure: Beryle Quant;  Surgeon: Irving Copas., MD;  Location: [facility];  Service: Gastroenterology;  Laterality: N/A;   FLEXIBLE SIGMOIDOSCOPY N/A 08/04/2020   Procedure: FLEXIBLE SIGMOIDOSCOPY;  Surgeon: Rush Landmark Telford Nab., MD;  Location: [facility];  Service: Gastroenterology;  Laterality: N/A;   HEMOSTASIS CLIP PLACEMENT   11/24/2019   Procedure: HEMOSTASIS CLIP PLACEMENT;  Surgeon: Irving Copas., MD;  Location: [facility];  Service: Gastroenterology;;   HEMOSTASIS CLIP PLACEMENT  08/04/2020   Procedure: HEMOSTASIS CLIP PLACEMENT;  Surgeon: Irving Copas., MD;  Location: [facility];  Service: Gastroenterology;;   SUBMUCOSAL LIFTING INJECTION  11/24/2019   Procedure: SUBMUCOSAL LIFTING INJECTION;  Surgeon: Irving Copas., MD;  Location: [facility];  Service: Gastroenterology;;   SUBMUCOSAL LIFTING INJECTION  08/04/2020   Procedure: SUBMUCOSAL LIFTING INJECTION;  Surgeon: Irving Copas., MD;  Location: [facility];  Service: Gastroenterology;;   TONSILLECTOMY      There were no vitals filed for this visit.   Subjective Assessment - 05/19/21 1214     Subjective I saw Dr. Wannetta Sender. I have tried the vaginal wand and it is helping. I am using coconut oil for the vulvar area.    Patient Stated Goals reduce pain    Currently in Pain? Yes    Pain Score 5     Pain Location Pelvis    Pain Orientation Right    Pain Descriptors / Indicators Sharp    Pain Type Chronic pain    Pain Radiating Towards can radiate to the mid inner thigh    Pain Onset More than a month ago    Pain Frequency Constant    Aggravating Factors  stress    Pain Relieving Factors medication, exercise including walking, hot bath, pshychologist with mental health    Multiple Pain  Sites No                            Pelvic Floor Special Questions - 05/19/21 0001     Pelvic Floor Internal Exam Patient confirms identification and approves PT to assess pelvic floor and treatment    Exam Type Vaginal               [facility] Adult PT Treatment/Exercise - 05/19/21 0001       Self-Care   Self-Care Other Self-Care Comments    Other Self-Care Comments  discussed with patient on using Aloe vera for the vaginal wand instead of cocnut oit; discussed with patient on bladder irritants and  how they affect the bladder      Lumbar Exercises: Stretches   Active Hamstring Stretch Right;Left;1 rep;30 seconds    Active Hamstring Stretch Limitations supine    Piriformis Stretch Right;Left;1 rep;30 seconds    Piriformis Stretch Limitations pigeon pose    Other Lumbar Stretch Exercise hip adduction in sitting hlding for 30 seconds      Manual Therapy   Manual Therapy Joint mobilization;Internal Pelvic Floor    Manual therapy comments educated patient on how to use the vaginal wand and she returned demonstration    Joint Mobilization mobilization to the left hip grade 3 for distraction, inferior glide, and posterior glide    Internal Pelvic Floor manual mobilization of the levator ani, along the urethra sphincter, along the sides of the bladder, perineal body and introitus                    PT Education - 05/19/21 1312     Education Details Access Code: [email or web address]; education on lubricants and bladder irritants    Person(s) Educated Patient    Methods Explanation;Demonstration;Verbal cues;Handout    Comprehension Returned demonstration;Verbalized understanding              PT Short Term Goals - 04/27/21 1701       PT SHORT TERM GOAL #1   Title independent with initial HEP    Time 4    Period Weeks    Status New    Target Date 05/25/21      PT SHORT TERM GOAL #2   Title pelvic pain decreased >/= 25% due to improve tissue mobility    Time 4    Period Weeks    Status New    Target Date 05/25/21               PT Long Term Goals - 04/27/21 1701       PT LONG TERM GOAL #1   Title independent with advanced HEP for core strength and hip strength    Time 12    Status New    Target Date 07/20/21      PT LONG TERM GOAL #2   Title able to fully empty her bladder due to full relaxation of the pelvic floor    Time 12    Period Weeks    Status New    Target Date 07/20/21      PT LONG TERM GOAL #3   Title pelvic pain decreased >/= 75% due to  reduction of trigger points in the pelvic floor muscles    Time 12    Period Days    Status New    Target Date 07/20/21      PT LONG TERM GOAL #4   Title urinary  leakage decreased >/= 75% due to improved coordination of the pelvic floor    Time 12    Period Weeks    Status New    Target Date 07/20/21                   Plan - 05/19/21 1214     Clinical Impression Statement Patient is using the wand at home. We educated her in the clinic today to use on her pelvic floor muscles. Patient responded well to the manual work due to the muscles had increased elongation. She continues to have difficulty with urination. Patient is working on the vaginal dryness. Patient was given hip stretches to assist in elongation of the muscles. She understands what bladder irritants are and how they affect the bladder. Patient will tr to stop the 5:00 coffee drink to see if that helps with the frequent night time voids. Patient wil benefit from skilled therapy to reduce pain and improve pelvic floor coordination and quality of life.    Personal Factors and Comorbidities Comorbidity 3+    Comorbidities abdominal hysterectomy; CAD; Coronary stent replacement 2014; myocardial infarction    Examination-Activity Limitations Continence    Examination-Participation Restrictions Community Activity    Stability/Clinical Decision Making Stable/Uncomplicated    Rehab Potential Excellent    PT Frequency 1x / week    PT Duration 12 weeks    PT Treatment/Interventions ADLs/Self Care Home Management;Biofeedback;Cryotherapy;Electrical Stimulation;Moist Heat;Ultrasound;Therapeutic activities;Therapeutic exercise;Neuromuscular re-education;Patient/family education;Manual techniques;Dry needling    PT Next Visit Plan work on pelvic floor muscles; pelvic floor strength; see if she is able to empty her bladder; check on bladder and fecal leakage    PT Home Exercise Plan Access Code: [email or web address]    Recommended Other Services  MD signed initial eval    Consulted and Agree with Plan of Care Patient             Patient will benefit from skilled therapeutic intervention in order to improve the following deficits and impairments:  Decreased coordination, Increased fascial restricitons, Pain, Decreased strength, Decreased mobility, Decreased activity tolerance  Visit Diagnosis: Muscle weakness (generalized)  Cramp and spasm  Pelvic pain     Problem List Patient Active Problem List   Diagnosis Date Noted   History of rectal polypectomy 06/06/2020   Serrated adenoma of colon 06/06/2020   Hx of adenomatous colonic polyps 06/06/2020   Myocardial infarction ([place])    Arthralgia of both hands 06/26/2019   CAD (coronary artery disease) 02/10/2019   HTN (hypertension) 02/10/2019   Hypothyroidism (acquired) 02/10/2019   Hyperlipidemia, mixed 02/10/2019   Osteopenia 02/10/2019   RLS (restless legs syndrome) 02/10/2019   Chronic pain 02/10/2019    Earlie Counts, PT 05/19/21 1:22 PM  [facility] [address] [place], [place], [address] Phone: [phone]   Fax:  [phone]  Name: Crystal Barnes MRN: [id number] Date of Birth: 07/01/1951

## 2021-06-01 ENCOUNTER — Encounter: Payer: Self-pay | Admitting: Internal Medicine

## 2021-06-01 DIAGNOSIS — M25552 Pain in left hip: Secondary | ICD-10-CM

## 2021-06-01 DIAGNOSIS — M8580 Other specified disorders of bone density and structure, unspecified site: Secondary | ICD-10-CM

## 2021-06-02 NOTE — Addendum Note (Signed)
Addended by: Binnie Rail on: 06/02/2021 07:46 PM   Modules accepted: Orders

## 2021-06-03 ENCOUNTER — Ambulatory Visit (INDEPENDENT_AMBULATORY_CARE_PROVIDER_SITE_OTHER): Payer: Medicare Other

## 2021-06-03 DIAGNOSIS — M25552 Pain in left hip: Secondary | ICD-10-CM | POA: Diagnosis not present

## 2021-06-06 ENCOUNTER — Other Ambulatory Visit: Payer: Self-pay | Admitting: Family Medicine

## 2021-06-06 NOTE — Addendum Note (Signed)
Addended by: Elza Rafter D on: 06/06/2021 04:47 PM   Modules accepted: Orders

## 2021-06-06 NOTE — Addendum Note (Signed)
Addended by: Elza Rafter D on: 06/06/2021 04:53 PM   Modules accepted: Orders

## 2021-06-09 ENCOUNTER — Encounter: Payer: Self-pay | Admitting: Internal Medicine

## 2021-06-09 ENCOUNTER — Encounter: Payer: Medicare Other | Admitting: Physical Therapy

## 2021-06-09 MED ORDER — TRAMADOL HCL 50 MG PO TABS: 100.0000 mg | ORAL_TABLET | Freq: Four times a day (QID) | ORAL | 0 refills | Status: DC

## 2021-06-09 NOTE — Addendum Note (Signed)
Addended by: Binnie Rail on: 06/09/2021 07:05 PM   Modules accepted: Orders

## 2021-06-09 NOTE — Telephone Encounter (Signed)
Last RF per controlled substance database: 05/10/21 Last OV: 03/21/21 Next OV: none scheduled

## 2021-06-14 DIAGNOSIS — E782 Mixed hyperlipidemia: Secondary | ICD-10-CM

## 2021-06-15 ENCOUNTER — Encounter: Payer: Self-pay | Admitting: Internal Medicine

## 2021-06-15 DIAGNOSIS — E782 Mixed hyperlipidemia: Secondary | ICD-10-CM | POA: Diagnosis not present

## 2021-06-16 DIAGNOSIS — E7800 Pure hypercholesterolemia, unspecified: Secondary | ICD-10-CM

## 2021-06-16 LAB — LIPID PANEL
Chol/HDL Ratio: 2 ratio (ref 0.0–4.4)
Cholesterol, Total: 115 mg/dL (ref 100–199)
HDL: 57 mg/dL (ref >39)
LDL Chol Calc (NIH): 37 mg/dL (ref 0–99)
Triglycerides: 117 mg/dL (ref 0–149)
VLDL Cholesterol Cal: 21 mg/dL (ref 5–40)

## 2021-06-16 LAB — HEPATIC FUNCTION PANEL
ALT: 31 IU/L (ref 0–32)
AST: 34 IU/L (ref 0–40)
Albumin: 4.6 g/dL (ref 3.8–4.8)
Alkaline Phosphatase: 63 IU/L (ref 44–121)
Bilirubin Total: 0.3 mg/dL (ref 0.0–1.2)
Bilirubin, Direct: 0.12 mg/dL (ref 0.00–0.40)
Total Protein: 6.2 g/dL (ref 6.0–8.5)

## 2021-06-16 MED ORDER — ROSUVASTATIN CALCIUM 5 MG PO TABS
5.0000 mg | ORAL_TABLET | Freq: Every day | ORAL | 3 refills | Status: DC
Start: 2021-06-16 — End: 2021-08-08

## 2021-06-22 ENCOUNTER — Encounter: Payer: Medicare Other | Admitting: Physical Therapy

## 2021-06-22 ENCOUNTER — Ambulatory Visit: Payer: Medicare Other | Admitting: Physician Assistant

## 2021-06-26 ENCOUNTER — Other Ambulatory Visit: Payer: Self-pay | Admitting: Family Medicine

## 2021-06-27 ENCOUNTER — Telehealth: Payer: Self-pay | Admitting: Internal Medicine

## 2021-06-27 NOTE — Telephone Encounter (Signed)
Left message for patient to call back and schedule the Medicare Annual Wellness Visit (AWV) virtually or by telephone.  Last AWV 03/29/20  Please schedule at anytime with [facility].  40 minute appointment  Any questions, please call me at [phone]

## 2021-06-28 ENCOUNTER — Other Ambulatory Visit: Payer: Self-pay

## 2021-06-28 ENCOUNTER — Ambulatory Visit: Payer: Medicare Other | Admitting: Orthopaedic Surgery

## 2021-06-28 ENCOUNTER — Encounter: Payer: Self-pay | Admitting: Orthopaedic Surgery

## 2021-06-28 DIAGNOSIS — M25552 Pain in left hip: Secondary | ICD-10-CM

## 2021-06-28 NOTE — Progress Notes (Signed)
Office Visit Note   Patient: Crystal Barnes           Date of Birth: [birth year]           MRN: [id number] Visit Date: 06/28/2021              Requested by: Binnie Rail, MD [place],  [place] [id number] PCP: Binnie Rail, MD   Assessment & Plan: Visit Diagnoses:  1. Pain in left hip     Plan: Based on findings impression is mild left hip abductor tendinopathy.  Fortunately it sounds like her symptoms were not constant and is not significantly preventing her from performing daily activities or maintaining quality of life.  X-rays show some mild age appropriate osteoarthritis but clinically she is asymptomatic.  I recommended NSAIDs as needed and rest as needed but otherwise she can follow-up as needed.  Follow-Up Instructions: Return if symptoms worsen or fail to improve.   Orders:  No orders of the defined types were placed in this encounter.  No orders of the defined types were placed in this encounter.     Procedures: No procedures performed   Clinical Data: No additional findings.   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  Currently she is no pain.  Sometimes it feels like there is a grinding sensation.  Aleve does help with the pain.  She takes gabapentin and tramadol for other issues such as restless leg syndrome.  She also has chronic pelvic pain.  Denies any groin pain.  Denies any back pain or radicular symptoms.  Review of Systems  Constitutional: Negative.   HENT: Negative.    Eyes: Negative.   Respiratory: Negative.    Cardiovascular: Negative.   Endocrine: Negative.   Musculoskeletal: Negative.   Neurological: Negative.   Hematological: Negative.   Psychiatric/Behavioral: Negative.    All other systems  reviewed and are negative.   Objective: Vital Signs: LMP  (LMP Unknown)   Physical Exam Vitals and nursing note reviewed.  Constitutional:      Appearance: She is well-developed.  Pulmonary:     Effort: Pulmonary effort is normal.  Skin:    General: Skin is warm.     Capillary Refill: Capillary refill takes less than 2 seconds.  Neurological:     Mental Status: She is alert and oriented to person, place, and time.  Psychiatric:        Behavior: Behavior normal.        Thought Content: Thought content normal.        Judgment: Judgment normal.    Ortho Exam  Left hip shows full range of motion.  Negative logroll negative [place] negative sciatic tension signs.  No pain with deep hip flexion.  Negative FADIR.  Negative FABER.  She is mainly tender over the tip of the left lateral aspect of the greater trochanter.  No significant pain or weakness with resisted hip abduction.  Negative Ober sign.  Specialty Comments:  No specialty comments available.  Imaging: No results found.   PMFS History: Patient Active Problem List   Diagnosis Date Noted   History of rectal polypectomy 06/06/2020   Serrated adenoma of colon 06/06/2020   Hx of adenomatous colonic polyps 06/06/2020  Myocardial infarction ([facility])    Arthralgia of both hands 06/26/2019   CAD (coronary artery disease) 02/10/2019   HTN (hypertension) 02/10/2019   Hypothyroidism (acquired) 02/10/2019   Hyperlipidemia, mixed 02/10/2019   Osteopenia 02/10/2019   RLS (restless legs syndrome) 02/10/2019   Chronic pain 02/10/2019   Past Medical History:  Diagnosis Date   Arthritis    CAD (coronary artery disease)    Cataract    Hyperlipidemia    Hypertension    Hypothyroidism    Myocardial infarction ([place])    Thyroid disease     Family History  Problem Relation Age of Onset   Esophageal cancer Father    Colon cancer Neg Hx    Rectal cancer Neg Hx    Stomach cancer Neg Hx    Inflammatory bowel disease Neg Hx     Liver disease Neg Hx    Pancreatic cancer Neg Hx     Past Surgical History:  Procedure Laterality Date   ABDOMINAL HYSTERECTOMY  2008   CATARACT EXTRACTION Bilateral    CORONARY STENT PLACEMENT  2014   ENDOSCOPIC MUCOSAL RESECTION N/A 11/24/2019   Procedure: ENDOSCOPIC MUCOSAL RESECTION;  Surgeon: Irving Copas., MD;  Location: [facility];  Service: Gastroenterology;  Laterality: N/A;   ENDOSCOPIC MUCOSAL RESECTION  08/04/2020   Procedure: ENDOSCOPIC MUCOSAL RESECTION;  Surgeon: Rush Landmark Telford Nab., MD;  Location: [facility];  Service: Gastroenterology;;   Otho Darner SIGMOIDOSCOPY N/A 11/24/2019   Procedure: Beryle Quant;  Surgeon: Irving Copas., MD;  Location: [facility];  Service: Gastroenterology;  Laterality: N/A;   FLEXIBLE SIGMOIDOSCOPY N/A 08/04/2020   Procedure: FLEXIBLE SIGMOIDOSCOPY;  Surgeon: Rush Landmark Telford Nab., MD;  Location: [facility];  Service: Gastroenterology;  Laterality: N/A;   HEMOSTASIS CLIP PLACEMENT  11/24/2019   Procedure: HEMOSTASIS CLIP PLACEMENT;  Surgeon: Irving Copas., MD;  Location: [facility];  Service: Gastroenterology;;   HEMOSTASIS CLIP PLACEMENT  08/04/2020   Procedure: HEMOSTASIS CLIP PLACEMENT;  Surgeon: Irving Copas., MD;  Location: [facility];  Service: Gastroenterology;;   SUBMUCOSAL LIFTING INJECTION  11/24/2019   Procedure: SUBMUCOSAL LIFTING INJECTION;  Surgeon: Irving Copas., MD;  Location: [facility];  Service: Gastroenterology;;   SUBMUCOSAL LIFTING INJECTION  08/04/2020   Procedure: SUBMUCOSAL LIFTING INJECTION;  Surgeon: Irving Copas., MD;  Location: [facility];  Service: Gastroenterology;;   TONSILLECTOMY     Social History   Occupational History   Occupation: Retired     Comment: about a year and half ago  Tobacco Use   Smoking status: Former    Years: 36.00    Types: Cigarettes   Smokeless tobacco: Never  Vaping Use   Vaping Use: Never used   Substance and Sexual Activity   Alcohol use: Not Currently   Drug use: Never   Sexual activity: Not Currently

## 2021-07-04 ENCOUNTER — Ambulatory Visit: Payer: Medicare Other | Admitting: Physical Therapy

## 2021-07-08 ENCOUNTER — Other Ambulatory Visit: Payer: Self-pay | Admitting: Internal Medicine

## 2021-07-11 ENCOUNTER — Ambulatory Visit (INDEPENDENT_AMBULATORY_CARE_PROVIDER_SITE_OTHER): Payer: Medicare Other

## 2021-07-11 ENCOUNTER — Other Ambulatory Visit: Payer: Self-pay

## 2021-07-11 DIAGNOSIS — Z0000 Encounter for general adult medical examination without abnormal findings: Secondary | ICD-10-CM

## 2021-07-11 NOTE — Patient Instructions (Signed)
Crystal Barnes , Thank you for taking time to come for your Medicare Wellness Visit. I appreciate your ongoing commitment to your health goals. Please review the following plan we discussed and let me know if I can assist you in the future.   Screening recommendations/referrals: Colonoscopy: 08/04/2020; every 10 years Mammogram: 10/20/2020; due every 1-2 years Bone Density: 07/24/2019; due every 2-3 years Recommended yearly ophthalmology/optometry visit for glaucoma screening and checkup Recommended yearly dental visit for hygiene and checkup  Vaccinations: Influenza vaccine: Due Fall 2022 Pneumococcal vaccine: 08/08/2018, 06/25/2019 Tdap vaccine: 07/28/2020; due every 10 years Shingles vaccine: never done (allergic reaction)   Covid-19: 11/11/2019, 12/02/2019, 08/07/2020  Advanced directives: Please bring a copy of your health care power of attorney and living will to the office at your convenience.  Conditions/risks identified: Yes; Client understands the importance of follow-up with providers by attending scheduled visits and discussed goals to eat healthier, increase physical activity, exercise the brain, socialize more, get enough sleep and make time for laughter.   Next appointment: Please schedule your next Medicare Wellness Visit with your Nurse Health Advisor in 1 year by calling [phone].   Preventive Care 61 Years and Older, Female Preventive care refers to lifestyle choices and visits with your health care provider that can promote health and wellness. What does preventive care include? A yearly physical exam. This is also called an annual well check. Dental exams once or twice a year. Routine eye exams. Ask your health care provider how often you should have your eyes checked. Personal lifestyle choices, including: Daily care of your teeth and gums. Regular physical activity. Eating a healthy diet. Avoiding tobacco and drug use. Limiting alcohol use. Practicing safe  sex. Taking low-dose aspirin every day. Taking vitamin and mineral supplements as recommended by your health care provider. What happens during an annual well check? The services and screenings done by your health care provider during your annual well check will depend on your age, overall health, lifestyle risk factors, and family history of disease. Counseling  Your health care provider may ask you questions about your: Alcohol use. Tobacco use. Drug use. Emotional well-being. Home and relationship well-being. Sexual activity. Eating habits. History of falls. Memory and ability to understand (cognition). Work and work Statistician. Reproductive health. Screening  You may have the following tests or measurements: Height, weight, and BMI. Blood pressure. Lipid and cholesterol levels. These may be checked every 5 years, or more frequently if you are over 50 years old. Skin check. Lung cancer screening. You may have this screening every year starting at age 41 if you have a 30-pack-year history of smoking and currently smoke or have quit within the past 15 years. Fecal occult blood test (FOBT) of the stool. You may have this test every year starting at age 25. Flexible sigmoidoscopy or colonoscopy. You may have a sigmoidoscopy every 5 years or a colonoscopy every 10 years starting at age 85. Hepatitis C blood test. Hepatitis B blood test. Sexually transmitted disease (STD) testing. Diabetes screening. This is done by checking your blood sugar (glucose) after you have not eaten for a while (fasting). You may have this done every 1-3 years. Bone density scan. This is done to screen for osteoporosis. You may have this done starting at age 6. Mammogram. This may be done every 1-2 years. Talk to your health care provider about how often you should have regular mammograms. Talk with your health care provider about your test results, treatment options, and if necessary,  the need for more  tests. Vaccines  Your health care provider may recommend certain vaccines, such as: Influenza vaccine. This is recommended every year. Tetanus, diphtheria, and acellular pertussis (Tdap, Td) vaccine. You may need a Td booster every 10 years. Zoster vaccine. You may need this after age 84. Pneumococcal 13-valent conjugate (PCV13) vaccine. One dose is recommended after age 53. Pneumococcal polysaccharide (PPSV23) vaccine. One dose is recommended after age 86. Talk to your health care provider about which screenings and vaccines you need and how often you need them. This information is not intended to replace advice given to you by your health care provider. Make sure you discuss any questions you have with your health care provider. Document Released: 10/22/2015 Document Revised: 06/14/2016 Document Reviewed: 07/27/2015 Elsevier Interactive Patient Education  2017 [employer] Prevention in the Home Falls can cause injuries. They can happen to people of all ages. There are many things you can do to make your home safe and to help prevent falls. What can I do on the outside of my home? Regularly fix the edges of walkways and driveways and fix any cracks. Remove anything that might make you trip as you walk through a door, such as a raised step or threshold. Trim any bushes or trees on the path to your home. Use bright outdoor lighting. Clear any walking paths of anything that might make someone trip, such as rocks or tools. Regularly check to see if handrails are loose or broken. Make sure that both sides of any steps have handrails. Any raised decks and porches should have guardrails on the edges. Have any leaves, snow, or ice cleared regularly. Use sand or salt on walking paths during winter. Clean up any spills in your garage right away. This includes oil or grease spills. What can I do in the bathroom? Use night lights. Install grab bars by the toilet and in the tub and shower.  Do not use towel bars as grab bars. Use non-skid mats or decals in the tub or shower. If you need to sit down in the shower, use a plastic, non-slip stool. Keep the floor dry. Clean up any water that spills on the floor as soon as it happens. Remove soap buildup in the tub or shower regularly. Attach bath mats securely with double-sided non-slip rug tape. Do not have throw rugs and other things on the floor that can make you trip. What can I do in the bedroom? Use night lights. Make sure that you have a light by your bed that is easy to reach. Do not use any sheets or blankets that are too big for your bed. They should not hang down onto the floor. Have a firm chair that has side arms. You can use this for support while you get dressed. Do not have throw rugs and other things on the floor that can make you trip. What can I do in the kitchen? Clean up any spills right away. Avoid walking on wet floors. Keep items that you use a lot in easy-to-reach places. If you need to reach something above you, use a strong step stool that has a grab bar. Keep electrical cords out of the way. Do not use floor polish or wax that makes floors slippery. If you must use wax, use non-skid floor wax. Do not have throw rugs and other things on the floor that can make you trip. What can I do with my stairs? Do not leave any items on  the stairs. Make sure that there are handrails on both sides of the stairs and use them. Fix handrails that are broken or loose. Make sure that handrails are as long as the stairways. Check any carpeting to make sure that it is firmly attached to the stairs. Fix any carpet that is loose or worn. Avoid having throw rugs at the top or bottom of the stairs. If you do have throw rugs, attach them to the floor with carpet tape. Make sure that you have a light switch at the top of the stairs and the bottom of the stairs. If you do not have them, ask someone to add them for you. What else  can I do to help prevent falls? Wear shoes that: Do not have high heels. Have rubber bottoms. Are comfortable and fit you well. Are closed at the toe. Do not wear sandals. If you use a stepladder: Make sure that it is fully opened. Do not climb a closed stepladder. Make sure that both sides of the stepladder are locked into place. Ask someone to hold it for you, if possible. Clearly mark and make sure that you can see: Any grab bars or handrails. First and last steps. Where the edge of each step is. Use tools that help you move around (mobility aids) if they are needed. These include: Canes. Walkers. Scooters. Crutches. Turn on the lights when you go into a dark area. Replace any light bulbs as soon as they burn out. Set up your furniture so you have a clear path. Avoid moving your furniture around. If any of your floors are uneven, fix them. If there are any pets around you, be aware of where they are. Review your medicines with your doctor. Some medicines can make you feel dizzy. This can increase your chance of falling. Ask your doctor what other things that you can do to help prevent falls. This information is not intended to replace advice given to you by your health care provider. Make sure you discuss any questions you have with your health care provider. Document Released: 07/22/2009 Document Revised: 03/02/2016 Document Reviewed: 10/30/2014 Elsevier Interactive Patient Education  2017 [employer].

## 2021-07-11 NOTE — Progress Notes (Signed)
I connected with Crystal Barnes today by telephone and verified that I am speaking with the correct person using two identifiers. Location patient: home Location provider: work Persons participating in the virtual visit: patient, provider.   I discussed the limitations, risks, security and privacy concerns of performing an evaluation and management service by telephone and the availability of in person appointments. I also discussed with the patient that there may be a patient responsible charge related to this service. The patient expressed understanding and verbally consented to this telephonic visit.    Interactive audio and video telecommunications were attempted between this provider and patient, however failed, due to patient having technical difficulties OR patient did not have access to video capability.  We continued and completed visit with audio only.  Some vital signs may be absent or patient reported.   Time Spent with patient on telephone encounter: 40 minutes  Subjective:   Crystal Barnes is a 70 y.o. female who presents for Medicare Annual (Subsequent) preventive examination.  Review of Systems     Cardiac Risk Factors include: advanced age (>18men, >25 women);dyslipidemia;hypertension     Objective:    There were no vitals filed for this visit. There is no height or weight on file to calculate BMI.  Advanced Directives 07/11/2021 04/27/2021 08/04/2020 03/29/2020 11/24/2019  Does Patient Have a Medical Advance Directive? Yes Yes Yes Yes Yes  Type of Advance Directive Living will;Healthcare Power of [place];Living will [place];Living will [place];Living will Living will  Does patient want to make changes to medical advance directive? No - Patient declined No - Patient declined - - -  Copy of [place] in Chart? No - copy requested No - copy requested No - copy requested No - copy  requested -    Current Medications (verified) Outpatient Encounter Medications as of 07/11/2021  Medication Sig   aspirin EC 81 MG tablet Take 1 tablet (81 mg total) by mouth daily.   Calcium-Magnesium-Vitamin D (CALCIUM 1200+D3 PO) Take 1 tablet by mouth daily.   doxylamine, Sleep, (UNISOM) 25 MG tablet Take 25 mg by mouth at bedtime as needed.   Evolocumab (REPATHA SURECLICK) 366 MG/ML SOAJ Inject 140 mg into the skin every 14 (fourteen) days.   gabapentin (NEURONTIN) 300 MG capsule TAKE 1 CAPSULE BY MOUTH 4  TIMES DAILY   levothyroxine (SYNTHROID) 88 MCG tablet Take 1 tablet (88 mcg total) by mouth daily before breakfast.   losartan (COZAAR) 50 MG tablet Take 1 tablet (50 mg total) by mouth daily.   Multiple Vitamin (MULTIVITAMIN WITH MINERALS) TABS tablet Take 1 tablet by mouth daily.   NONFORMULARY OR COMPOUNDED ITEM Amitriptyline 2.5%/ gabapentin 2.5%/ baclofen 2.5% in vaginal cream. Place 1g twice a day in vaginal/ vulvar area. Dispense 60g with 5 refills.   Omega-3 Fatty Acids (FISH OIL) 1360 MG CAPS Take 1,360 mg by mouth at bedtime.   Polyethyl Glycol-Propyl Glycol (SYSTANE OP) Place 1 drop into both eyes daily as needed (Dry eyes).    rosuvastatin (CRESTOR) 5 MG tablet Take 1 tablet (5 mg total) by mouth daily.   Turmeric 500 MG CAPS Take 500 mg by mouth at bedtime.   No facility-administered encounter medications on file as of 07/11/2021.    Allergies (verified) Patient has no known allergies.   History: Past Medical History:  Diagnosis Date   Arthritis    CAD (coronary artery disease)    Cataract    Hyperlipidemia  Hypertension    Hypothyroidism    Myocardial infarction [facility])    Thyroid disease    Past Surgical History:  Procedure Laterality Date   ABDOMINAL HYSTERECTOMY  2008   CATARACT EXTRACTION Bilateral    CORONARY STENT PLACEMENT  2014   ENDOSCOPIC MUCOSAL RESECTION N/A 11/24/2019   Procedure: ENDOSCOPIC MUCOSAL RESECTION;  Surgeon: Irving Copas., MD;  Location: [place];  Service: Gastroenterology;  Laterality: N/A;   ENDOSCOPIC MUCOSAL RESECTION  08/04/2020   Procedure: ENDOSCOPIC MUCOSAL RESECTION;  Surgeon: Rush Landmark Telford Nab., MD;  Location: [facility];  Service: Gastroenterology;;   Otho Darner SIGMOIDOSCOPY N/A 11/24/2019   Procedure: Beryle Quant;  Surgeon: Irving Copas., MD;  Location: [place];  Service: Gastroenterology;  Laterality: N/A;   FLEXIBLE SIGMOIDOSCOPY N/A 08/04/2020   Procedure: FLEXIBLE SIGMOIDOSCOPY;  Surgeon: Rush Landmark Telford Nab., MD;  Location: [facility];  Service: Gastroenterology;  Laterality: N/A;   HEMOSTASIS CLIP PLACEMENT  11/24/2019   Procedure: HEMOSTASIS CLIP PLACEMENT;  Surgeon: Irving Copas., MD;  Location: [place];  Service: Gastroenterology;;   HEMOSTASIS CLIP PLACEMENT  08/04/2020   Procedure: HEMOSTASIS CLIP PLACEMENT;  Surgeon: Irving Copas., MD;  Location: [facility];  Service: Gastroenterology;;   SUBMUCOSAL LIFTING INJECTION  11/24/2019   Procedure: SUBMUCOSAL LIFTING INJECTION;  Surgeon: Irving Copas., MD;  Location: [place];  Service: Gastroenterology;;   SUBMUCOSAL LIFTING INJECTION  08/04/2020   Procedure: SUBMUCOSAL LIFTING INJECTION;  Surgeon: Irving Copas., MD;  Location: [facility];  Service: Gastroenterology;;   TONSILLECTOMY     Family History  Problem Relation Age of Onset   Esophageal cancer Father    Colon cancer Neg Hx    Rectal cancer Neg Hx    Stomach cancer Neg Hx    Inflammatory bowel disease Neg Hx    Liver disease Neg Hx    Pancreatic cancer Neg Hx    Social History   Socioeconomic History   Marital status: Single    Spouse name: Not on file   Number of children: Not on file   Years of education: Not on file   Highest education level: Not on file  Occupational History   Occupation: Retired     Comment: about a year and half ago  Tobacco Use   Smoking status: Former     Years: 36.00    Types: Cigarettes   Smokeless tobacco: Never  Vaping Use   Vaping Use: Never used  Substance and Sexual Activity   Alcohol use: Not Currently   Drug use: Never   Sexual activity: Not Currently  Other Topics Concern   Not on file  Social History Narrative   Not on file   Social Determinants of Health   Financial Resource Strain: Low Risk    Difficulty of Paying Living Expenses: Not hard at all  Food Insecurity: No Food Insecurity   Worried About Charity fundraiser in the Last Year: Never true   Ran Out of Food in the Last Year: Never true  Transportation Needs: No Transportation Needs   Lack of Transportation (Medical): No   Lack of Transportation (Non-Medical): No  Physical Activity: Sufficiently Active   Days of Exercise per Week: 7 days   Minutes of Exercise per Session: 60 min  Stress: No Stress Concern Present   Feeling of Stress : Not at all  Social Connections: Moderately Integrated   Frequency of Communication with Friends and Family: More than three times a week   Frequency of Social Gatherings  with Friends and Family: More than three times a week   Attends Religious Services: More than 4 times per year   Active Member of Clubs or Organizations: Yes   Attends Music therapist: More than 4 times per year   Marital Status: Never married    Tobacco Counseling Counseling given: Not Answered   Clinical Intake:  Pre-visit preparation completed: Yes  Pain : No/denies pain     Nutritional Risks: None Diabetes: No  How often do you need to have someone help you when you read instructions, pamphlets, or other written materials from your doctor or pharmacy?: 1 - Never What is the last grade level you completed in school?: Ph.D in Psychology  Diabetic? no  Interpreter Needed?: No  Information entered by :: Lisette Abu, LPN   Activities of Daily Living In your present state of health, do you have any difficulty  performing the following activities: 07/11/2021  Hearing? N  Vision? N  Difficulty concentrating or making decisions? N  Walking or climbing stairs? N  Dressing or bathing? N  Doing errands, shopping? N  Preparing Food and eating ? N  Using the Toilet? N  In the past six months, have you accidently leaked urine? N  Do you have problems with loss of bowel control? N  Managing your Medications? N  Managing your Finances? N  Housekeeping or managing your Housekeeping? N  Some recent data might be hidden    Patient Care Team: Binnie Rail, MD as PCP - General (Internal Medicine) Croitoru, Dani Gobble, MD as PCP - Cardiology (Cardiology) Calvert Cantor, MD as Consulting Physician (Ophthalmology)  Indicate any recent Medical Services you may have received from other than [facility] providers in the past year (date may be approximate).     Assessment:   This is a routine wellness examination for [place].  Hearing/Vision screen Hearing Screening - Comments:: Patient denied any hearing difficulty.   No hearing aids.  Vision Screening - Comments:: Patient wears corrective glasses/contacts.  Eye exam done annually by: [facility]  Dietary issues and exercise activities discussed: Current Exercise Habits: Home exercise routine, Type of exercise: walking, Time (Minutes): 60, Frequency (Times/Week): 7, Weekly Exercise (Minutes/Week): 420, Intensity: Moderate, Exercise limited by: None identified   Goals Addressed               This Visit's Progress     Patient Stated (pt-stated)        My goal is to remain healthy and continue to keep my cholesterol down.  I would like to be able to stop some of my medications.      Depression Screen PHQ 2/9 Scores 07/11/2021 12/23/2020 03/29/2020 06/23/2019 02/10/2019  PHQ - 2 Score 0 0 0 0 0  PHQ- 9 Score - 2 - - 2    Fall Risk Fall Risk  07/11/2021 12/23/2020 07/28/2020 03/29/2020 06/23/2019  Falls in the past year? 0 0 0 0 0  Number falls in past yr:  0 - - 0 0  Injury with Fall? 0 - - 1 -  Comment - - - fell and scratched knee walking dog -  Risk for fall due to : No Fall Risks - - No Fall Risks -  Follow up Falls evaluation completed - - Falls prevention discussed Falls evaluation completed    FALL RISK PREVENTION PERTAINING TO THE HOME:  Any stairs in or around the home? No  If so, are there any without handrails? No  Home free of  loose throw rugs in walkways, pet beds, electrical cords, etc? Yes  Adequate lighting in your home to reduce risk of falls? Yes   ASSISTIVE DEVICES UTILIZED TO PREVENT FALLS:  Life alert? Yes  Use of a cane, walker or w/c? No  Grab bars in the bathroom? No  Shower chair or bench in shower? No  Elevated toilet seat or a handicapped toilet? No   TIMED UP AND GO:  Was the test performed? No .  Length of time to ambulate 10 feet: n/a sec.   Gait steady and fast without use of assistive device  Cognitive Function: Normal cognitive status assessed by direct observation by this Nurse Health Advisor. No abnormalities found.       6CIT Screen 03/29/2020  What Year? 0 points  What month? 0 points  What time? 0 points  Count back from 20 0 points  Months in reverse 0 points  Repeat phrase 0 points  Total Score 0    Immunizations Immunization History  Administered Date(s) Administered   Influenza, High Dose Seasonal PF 06/21/2019, 07/27/2020   Influenza-Unspecified 08/08/2018, 06/21/2019   Moderna Sars-Covid-2 Vaccination 11/11/2019, 12/02/2019, 08/07/2020   Pneumococcal Conjugate-13 08/08/2018   Pneumococcal Polysaccharide-23 06/25/2019   Tdap 07/28/2020    TDAP status: Up to date  Flu Vaccine status: Due, Education has been provided regarding the importance of this vaccine. Advised may receive this vaccine at local pharmacy or Health Dept. Aware to provide a copy of the vaccination record if obtained from local pharmacy or Health Dept. Verbalized acceptance and  understanding.  Pneumococcal vaccine status: Up to date  Covid-19 vaccine status: Completed vaccines  Qualifies for Shingles Vaccine? Yes   Zostavax completed No   Shingrix Completed?: No.    Education has been provided regarding the importance of this vaccine. Patient has been advised to call insurance company to determine out of pocket expense if they have not yet received this vaccine. Advised may also receive vaccine at local pharmacy or Health Dept. Verbalized acceptance and understanding.  Screening Tests Health Maintenance  Topic Date Due   COVID-19 Vaccine (4 - Booster) 10/30/2020   INFLUENZA VACCINE  05/09/2021   DEXA SCAN  07/23/2022   MAMMOGRAM  10/20/2022   COLONOSCOPY (Pts 45-63yrs Insurance coverage will need to be confirmed)  08/18/2029   TETANUS/TDAP  07/28/2030   Hepatitis C Screening  Completed   HPV VACCINES  Aged Out   Zoster Vaccines- Shingrix  Discontinued    Health Maintenance  Health Maintenance Due  Topic Date Due   COVID-19 Vaccine (4 - Booster) 10/30/2020   INFLUENZA VACCINE  05/09/2021    Colorectal cancer screening: Type of screening: Colonoscopy. Completed 08/04/2020. Repeat every 10 years  Mammogram status: Completed 10/20/2020. Repeat every year  Bone Density status: Completed 07/24/2019. Results reflect: Bone density results: OSTEOPENIA. Repeat every 3 years.  Lung Cancer Screening: (Low Dose CT Chest recommended if Age 6-80 years, 30 pack-year currently smoking OR have quit w/in 15years.) does not qualify.   Lung Cancer Screening Referral: no  Additional Screening:  Hepatitis C Screening: does qualify; Completed yes  Vision Screening: Recommended annual ophthalmology exams for early detection of glaucoma and other disorders of the eye. Is the patient up to date with their annual eye exam?  Yes  Who is the provider or what is the name of the office in which the patient attends annual eye exams? [facility] If pt is not  established with a provider, would they like to be  referred to a provider to establish care? No .   Dental Screening: Recommended annual dental exams for proper oral hygiene  Community Resource Referral / Chronic Care Management: CRR required this visit?  No   CCM required this visit?  No      Plan:     I have personally reviewed and noted the following in the patient's chart:   Medical and social history Use of alcohol, tobacco or illicit drugs  Current medications and supplements including opioid prescriptions.  Functional ability and status Nutritional status Physical activity Advanced directives List of other physicians Hospitalizations, surgeries, and ER visits in previous 12 months Vitals Screenings to include cognitive, depression, and falls Referrals and appointments  In addition, I have reviewed and discussed with patient certain preventive protocols, quality metrics, and best practice recommendations. A written personalized care plan for preventive services as well as general preventive health recommendations were provided to patient.     Sheral Flow, LPN   24/02/8098   Nurse Notes:  Hearing Screening - Comments:: Patient denied any hearing difficulty.   No hearing aids.  Vision Screening - Comments:: Patient wears corrective glasses/contacts.  Eye exam done annually by: [facility]  Patient is cogitatively intact. There were no vitals filed for this visit. There is no height or weight on file to calculate BMI. Patient stated that she has no issues with gait or balance; does not use any assistive devices. Medications reviewed with patient; no opioid use noted.

## 2021-07-12 MED ORDER — TRAMADOL HCL 50 MG PO TABS: 100.0000 mg | ORAL_TABLET | Freq: Four times a day (QID) | ORAL | 0 refills | Status: DC

## 2021-07-27 ENCOUNTER — Encounter: Payer: Medicare Other | Admitting: Physical Therapy

## 2021-07-31 NOTE — Progress Notes (Signed)
Subjective:    Patient ID: Crystal Barnes, female    DOB: August 31, 1951, 70 y.o.   MRN: [id number]   This visit occurred during the SARS-CoV-2 public health emergency.  Safety protocols were in place, including screening questions prior to the visit, additional usage of staff PPE, and extensive cleaning of exam room while observing appropriate contact time as indicated for disinfecting solutions.    HPI She is here for a physical exam.   RLS - started 15 years ago.  Has gotten worse recently-over the past few months.   Would like to scratch at her right labial pain was really pelvic muscle dysfunction.  She did a couple of sessions of physical therapy and that has made a tremendous difference.  Her pain is much better.  She is not taking tramadol right now.  She is taking the gabapentin as needed.    Medications and allergies reviewed with patient and updated if appropriate.  Patient Active Problem List   Diagnosis Date Noted   History of rectal polypectomy 06/06/2020   Serrated adenoma of colon 06/06/2020   Hx of adenomatous colonic polyps 06/06/2020   Myocardial infarction ([place])    Arthralgia of both hands 06/26/2019   CAD (coronary artery disease) 02/10/2019   HTN (hypertension) 02/10/2019   Hypothyroidism (acquired) 02/10/2019   Hyperlipidemia, mixed 02/10/2019   Osteopenia 02/10/2019   RLS (restless legs syndrome) 02/10/2019   Chronic pain 02/10/2019    Current Outpatient Medications on File Prior to Visit  Medication Sig Dispense Refill   aspirin EC 81 MG tablet Take 1 tablet (81 mg total) by mouth daily. 30 tablet    Calcium-Magnesium-Vitamin D (CALCIUM 1200+D3 PO) Take 1 tablet by mouth daily.     doxylamine, Sleep, (UNISOM) 25 MG tablet Take 25 mg by mouth at bedtime as needed.     Evolocumab (REPATHA SURECLICK) 998 MG/ML SOAJ Inject 140 mg into the skin every 14 (fourteen) days. 2 mL 11   gabapentin (NEURONTIN) 300 MG capsule TAKE 1 CAPSULE BY MOUTH 4  TIMES  DAILY 360 capsule 3   levothyroxine (SYNTHROID) 88 MCG tablet Take 1 tablet (88 mcg total) by mouth daily before breakfast. 90 tablet 3   losartan (COZAAR) 50 MG tablet Take 1 tablet (50 mg total) by mouth daily. 90 tablet 3   Multiple Vitamin (MULTIVITAMIN WITH MINERALS) TABS tablet Take 1 tablet by mouth daily.     Omega-3 Fatty Acids (FISH OIL) 1360 MG CAPS Take 1,360 mg by mouth at bedtime.     Polyethyl Glycol-Propyl Glycol (SYSTANE OP) Place 1 drop into both eyes daily as needed (Dry eyes).      rosuvastatin (CRESTOR) 5 MG tablet Take 1 tablet (5 mg total) by mouth daily. 90 tablet 3   traMADol (ULTRAM) 50 MG tablet Take 2 tablets (100 mg total) by mouth in the morning, at noon, in the evening, and at bedtime. 240 tablet 0   Turmeric 500 MG CAPS Take 500 mg by mouth at bedtime.     No current facility-administered medications on file prior to visit.    Past Medical History:  Diagnosis Date   Arthritis    CAD (coronary artery disease)    Cataract    Hyperlipidemia    Hypertension    Hypothyroidism    Myocardial infarction [facility])    Thyroid disease     Past Surgical History:  Procedure Laterality Date   ABDOMINAL HYSTERECTOMY  2008   CATARACT EXTRACTION Bilateral    CORONARY  STENT PLACEMENT  2014   ENDOSCOPIC MUCOSAL RESECTION N/A 11/24/2019   Procedure: ENDOSCOPIC MUCOSAL RESECTION;  Surgeon: Rush Landmark Telford Nab., MD;  Location: [place];  Service: Gastroenterology;  Laterality: N/A;   ENDOSCOPIC MUCOSAL RESECTION  08/04/2020   Procedure: ENDOSCOPIC MUCOSAL RESECTION;  Surgeon: Rush Landmark Telford Nab., MD;  Location: [facility];  Service: Gastroenterology;;   Otho Darner SIGMOIDOSCOPY N/A 11/24/2019   Procedure: Beryle Quant;  Surgeon: Irving Copas., MD;  Location: [place];  Service: Gastroenterology;  Laterality: N/A;   FLEXIBLE SIGMOIDOSCOPY N/A 08/04/2020   Procedure: FLEXIBLE SIGMOIDOSCOPY;  Surgeon: Rush Landmark Telford Nab., MD;  Location: [facility];  Service: Gastroenterology;  Laterality: N/A;   HEMOSTASIS CLIP PLACEMENT  11/24/2019   Procedure: HEMOSTASIS CLIP PLACEMENT;  Surgeon: Irving Copas., MD;  Location: [place];  Service: Gastroenterology;;   HEMOSTASIS CLIP PLACEMENT  08/04/2020   Procedure: HEMOSTASIS CLIP PLACEMENT;  Surgeon: Irving Copas., MD;  Location: [facility];  Service: Gastroenterology;;   SUBMUCOSAL LIFTING INJECTION  11/24/2019   Procedure: SUBMUCOSAL LIFTING INJECTION;  Surgeon: Irving Copas., MD;  Location: [place];  Service: Gastroenterology;;   SUBMUCOSAL LIFTING INJECTION  08/04/2020   Procedure: SUBMUCOSAL LIFTING INJECTION;  Surgeon: Irving Copas., MD;  Location: [facility];  Service: Gastroenterology;;   TONSILLECTOMY      Social History   Socioeconomic History   Marital status: Single    Spouse name: Not on file   Number of children: Not on file   Years of education: Not on file   Highest education level: Not on file  Occupational History   Occupation: Retired     Comment: about a year and half ago  Tobacco Use   Smoking status: Former    Years: 36.00    Types: Cigarettes   Smokeless tobacco: Never  Vaping Use   Vaping Use: Never used  Substance and Sexual Activity   Alcohol use: Not Currently   Drug use: Never   Sexual activity: Not Currently  Other Topics Concern   Not on file  Social History Narrative   Not on file   Social Determinants of Health   Financial Resource Strain: Low Risk    Difficulty of Paying Living Expenses: Not hard at all  Food Insecurity: No Food Insecurity   Worried About Charity fundraiser in the Last Year: Never true   [place] in the Last Year: Never true  Transportation Needs: No Transportation Needs   Lack of Transportation (Medical): No   Lack of Transportation (Non-Medical): No  Physical Activity: Sufficiently Active   Days of Exercise per Week: 7 days   Minutes of Exercise per  Session: 60 min  Stress: No Stress Concern Present   Feeling of Stress : Not at all  Social Connections: Moderately Integrated   Frequency of Communication with Friends and Family: More than three times a week   Frequency of Social Gatherings with Friends and Family: More than three times a week   Attends Religious Services: More than 4 times per year   Active Member of [employer] or Organizations: Yes   Attends Music therapist: More than 4 times per year   Marital Status: Never married    Family History  Problem Relation Age of Onset   Esophageal cancer Father    Colon cancer Neg Hx    Rectal cancer Neg Hx    Stomach cancer Neg Hx    Inflammatory bowel disease Neg Hx    Liver disease  Neg Hx    Pancreatic cancer Neg Hx     Review of Systems  Constitutional:  Negative for chills and fever.  Eyes:  Negative for visual disturbance.  Respiratory:  Negative for cough, shortness of breath and wheezing.   Cardiovascular:  Negative for chest pain, palpitations and leg swelling.  Gastrointestinal:  Positive for constipation. Negative for abdominal distention, abdominal pain, blood in stool, diarrhea and nausea.       No gerd  Genitourinary:  Negative for dysuria and hematuria.  Musculoskeletal:  Negative for arthralgias and back pain.  Skin:  Negative for color change and rash.  Neurological:  Positive for headaches (occ migraine). Negative for dizziness, light-headedness and numbness.  Psychiatric/Behavioral:  Negative for dysphoric mood. The patient is not nervous/anxious.       Objective:   Vitals:   08/01/21 1301  BP: 118/78  Pulse: 71  Temp: 98.1 F (36.7 C)  SpO2: 98%   Filed Weights   08/01/21 1301  Weight: 115 lb (52.2 kg)   Body mass index is 20.37 kg/m.  BP Readings from Last 3 Encounters:  08/01/21 118/78  05/12/21 136/85  03/21/21 128/80    Wt Readings from Last 3 Encounters:  08/01/21 115 lb (52.2 kg)  05/12/21 112 lb (50.8 kg)  03/21/21 112  lb (50.8 kg)     Physical Exam Constitutional: She appears well-developed and well-nourished. No distress.  HENT:  Head: Normocephalic and atraumatic.  Right Ear: External ear normal. Normal ear canal and TM Left Ear: External ear normal.  Normal ear canal and TM Mouth/Throat: Oropharynx is clear and moist.  Eyes: Conjunctivae and EOM are normal.  Neck: Neck supple. No tracheal deviation present. No thyromegaly present.  No carotid bruit  Cardiovascular: Normal rate, regular rhythm and normal heart sounds.   No murmur heard.  No edema. Pulmonary/Chest: Effort normal and breath sounds normal. No respiratory distress. She has no wheezes. She has no rales.  Breast: deferred   Abdominal: Soft. She exhibits no distension. There is no tenderness.  Lymphadenopathy: She has no cervical adenopathy.  Skin: Skin is warm and dry. She is not diaphoretic.  Psychiatric: She has a normal mood and affect. Her behavior is normal.     Lab Results  Component Value Date   WBC 5.5 07/23/2020   HGB 13.1 07/23/2020   HCT 39.3 07/23/2020   PLT 256 07/23/2020   GLUCOSE 91 07/23/2020   CHOL 115 06/15/2021   TRIG 117 06/15/2021   HDL 57 06/15/2021   LDLCALC 37 06/15/2021   ALT 31 06/15/2021   AST 34 06/15/2021   NA 135 07/23/2020   K 5.4 (H) 07/23/2020   CL 105 07/23/2020   CREATININE 0.57 07/23/2020   BUN 18 07/23/2020   CO2 27 07/23/2020   TSH 2.07 12/29/2020   HGBA1C 4.8 07/23/2020   MICROALBUR 0.2 07/23/2020         Assessment & Plan:   Physical exam: Screening blood work  ordered Exercise  walking Weight  normal Substance abuse  none   Reviewed recommended immunizations.   Health Maintenance  Topic Date Due   COVID-19 Vaccine (4 - Booster) 08/17/2021 (Originally 10/02/2020)   DEXA SCAN  07/23/2022   MAMMOGRAM  10/20/2022   COLONOSCOPY (Pts 45-49yrs Insurance coverage will need to be confirmed)  08/18/2029   TETANUS/TDAP  07/28/2030   Pneumonia Vaccine 68+ Years old   Completed   INFLUENZA VACCINE  Completed   Hepatitis C Screening  Completed   HPV  VACCINES  Aged Out   Zoster Vaccines- Shingrix  Discontinued          See Problem List for Assessment and Plan of chronic medical problems.

## 2021-07-31 NOTE — Patient Instructions (Addendum)
Blood work was ordered.     Medications changes include :   requip for RLS - take at bedtime.    Your prescription(s) have been submitted to your pharmacy. Please take as directed and contact our office if you believe you are having problem(s) with the medication(s).   Please followup in 6 months   Health Maintenance, Female Adopting a healthy lifestyle and getting preventive care are important in promoting health and wellness. Ask your health care provider about: The right schedule for you to have regular tests and exams. Things you can do on your own to prevent diseases and keep yourself healthy. What should I know about diet, weight, and exercise? Eat a healthy diet  Eat a diet that includes plenty of vegetables, fruits, low-fat dairy products, and lean protein. Do not eat a lot of foods that are high in solid fats, added sugars, or sodium. Maintain a healthy weight Body mass index (BMI) is used to identify weight problems. It estimates body fat based on height and weight. Your health care provider can help determine your BMI and help you achieve or maintain a healthy weight. Get regular exercise Get regular exercise. This is one of the most important things you can do for your health. Most adults should: Exercise for at least 150 minutes each week. The exercise should increase your heart rate and make you sweat (moderate-intensity exercise). Do strengthening exercises at least twice a week. This is in addition to the moderate-intensity exercise. Spend less time sitting. Even light physical activity can be beneficial. Watch cholesterol and blood lipids Have your blood tested for lipids and cholesterol at 68 years of age, then have this test every 5 years. Have your cholesterol levels checked more often if: Your lipid or cholesterol levels are high. You are older than 68 years of age. You are at high risk for heart disease. What should I know about cancer screening? Depending  on your health history and family history, you may need to have cancer screening at various ages. This may include screening for: Breast cancer. Cervical cancer. Colorectal cancer. Skin cancer. Lung cancer. What should I know about heart disease, diabetes, and high blood pressure? Blood pressure and heart disease High blood pressure causes heart disease and increases the risk of stroke. This is more likely to develop in people who have high blood pressure readings, are of African descent, or are overweight. Have your blood pressure checked: Every 3-5 years if you are 49-32 years of age. Every year if you are 39 years old or older. Diabetes Have regular diabetes screenings. This checks your fasting blood sugar level. Have the screening done: Once every three years after age 34 if you are at a normal weight and have a low risk for diabetes. More often and at a younger age if you are overweight or have a high risk for diabetes. What should I know about preventing infection? Hepatitis B If you have a higher risk for hepatitis B, you should be screened for this virus. Talk with your health care provider to find out if you are at risk for hepatitis B infection. Hepatitis C Testing is recommended for: Everyone born from [birth year] through 1963. Anyone with known risk factors for hepatitis C. Sexually transmitted infections (STIs) Get screened for STIs, including gonorrhea and chlamydia, if: You are sexually active and are younger than 68 years of age. You are older than 68 years of age and your health care provider tells you that you are at  risk for this type of infection. Your sexual activity has changed since you were last screened, and you are at increased risk for chlamydia or gonorrhea. Ask your health care provider if you are at risk. Ask your health care provider about whether you are at high risk for HIV. Your health care provider may recommend a prescription medicine to help prevent HIV  infection. If you choose to take medicine to prevent HIV, you should first get tested for HIV. You should then be tested every 3 months for as long as you are taking the medicine. Pregnancy If you are about to stop having your period (premenopausal) and you may become pregnant, seek counseling before you get pregnant. Take 400 to 800 micrograms (mcg) of folic acid every day if you become pregnant. Ask for birth control (contraception) if you want to prevent pregnancy. Osteoporosis and menopause Osteoporosis is a disease in which the bones lose minerals and strength with aging. This can result in bone fractures. If you are 6 years old or older, or if you are at risk for osteoporosis and fractures, ask your health care provider if you should: Be screened for bone loss. Take a calcium or vitamin D supplement to lower your risk of fractures. Be given hormone replacement therapy (HRT) to treat symptoms of menopause. Follow these instructions at home: Lifestyle Do not use any products that contain nicotine or tobacco, such as cigarettes, e-cigarettes, and chewing tobacco. If you need help quitting, ask your health care provider. Do not use street drugs. Do not share needles. Ask your health care provider for help if you need support or information about quitting drugs. Alcohol use Do not drink alcohol if: Your health care provider tells you not to drink. You are pregnant, may be pregnant, or are planning to become pregnant. If you drink alcohol: Limit how much you use to 0-1 drink a day. Limit intake if you are breastfeeding. Be aware of how much alcohol is in your drink. In the [place] one drink equals one 12 oz bottle of beer (355 mL), one 5 oz glass of wine (148 mL), or one 1 oz glass of hard liquor (44 mL). General instructions Schedule regular health, dental, and eye exams. Stay current with your vaccines. Tell your health care provider if: You often feel depressed. You have ever been  abused or do not feel safe at home. Summary Adopting a healthy lifestyle and getting preventive care are important in promoting health and wellness. Follow your health care provider's instructions about healthy diet, exercising, and getting tested or screened for diseases. Follow your health care provider's instructions on monitoring your cholesterol and blood pressure. This information is not intended to replace advice given to you by your health care provider. Make sure you discuss any questions you have with your health care provider. Document Revised: 12/03/2020 Document Reviewed: 09/18/2018 Elsevier Patient Education  2022 [employer].

## 2021-08-01 ENCOUNTER — Encounter: Payer: Self-pay | Admitting: Internal Medicine

## 2021-08-01 ENCOUNTER — Ambulatory Visit (INDEPENDENT_AMBULATORY_CARE_PROVIDER_SITE_OTHER): Payer: Medicare Other | Admitting: Internal Medicine

## 2021-08-01 ENCOUNTER — Other Ambulatory Visit: Payer: Self-pay

## 2021-08-01 VITALS — BP 118/78 | HR 71 | Temp 98.1°F | Ht 63.0 in | Wt 115.0 lb

## 2021-08-01 DIAGNOSIS — E039 Hypothyroidism, unspecified: Secondary | ICD-10-CM

## 2021-08-01 DIAGNOSIS — E782 Mixed hyperlipidemia: Secondary | ICD-10-CM | POA: Diagnosis not present

## 2021-08-01 DIAGNOSIS — I2510 Atherosclerotic heart disease of native coronary artery without angina pectoris: Secondary | ICD-10-CM

## 2021-08-01 DIAGNOSIS — G2581 Restless legs syndrome: Secondary | ICD-10-CM

## 2021-08-01 DIAGNOSIS — M8580 Other specified disorders of bone density and structure, unspecified site: Secondary | ICD-10-CM | POA: Diagnosis not present

## 2021-08-01 DIAGNOSIS — Z0000 Encounter for general adult medical examination without abnormal findings: Secondary | ICD-10-CM

## 2021-08-01 DIAGNOSIS — I10 Essential (primary) hypertension: Secondary | ICD-10-CM

## 2021-08-01 DIAGNOSIS — G8929 Other chronic pain: Secondary | ICD-10-CM | POA: Diagnosis not present

## 2021-08-01 LAB — CBC WITH DIFFERENTIAL/PLATELET
Basophils Absolute: 0.1 10*3/uL (ref 0.0–0.1)
Basophils Relative: 1.4 % (ref 0.0–3.0)
Eosinophils Absolute: 0.5 10*3/uL (ref 0.0–0.7)
Eosinophils Relative: 9.7 % — ABNORMAL HIGH (ref 0.0–5.0)
HCT: 44.3 % (ref 36.0–46.0)
Hemoglobin: 14.8 g/dL (ref 12.0–15.0)
Lymphocytes Relative: 32 % (ref 12.0–46.0)
Lymphs Abs: 1.6 10*3/uL (ref 0.7–4.0)
MCHC: 33.5 g/dL (ref 30.0–36.0)
MCV: 92 fl (ref 78.0–100.0)
Monocytes Absolute: 0.5 10*3/uL (ref 0.1–1.0)
Monocytes Relative: 8.9 % (ref 3.0–12.0)
Neutro Abs: 2.4 10*3/uL (ref 1.4–7.7)
Neutrophils Relative %: 48 % (ref 43.0–77.0)
Platelets: 293 10*3/uL (ref 150.0–400.0)
RBC: 4.81 Mil/uL (ref 3.87–5.11)
RDW: 12.5 % (ref 11.5–15.5)
WBC: 5.1 10*3/uL (ref 4.0–10.5)

## 2021-08-01 LAB — COMPREHENSIVE METABOLIC PANEL
ALT: 28 U/L (ref 0–35)
AST: 32 U/L (ref 0–37)
Albumin: 4.5 g/dL (ref 3.5–5.2)
Alkaline Phosphatase: 60 U/L (ref 39–117)
BUN: 18 mg/dL (ref 6–23)
CO2: 25 mEq/L (ref 19–32)
Calcium: 9.9 mg/dL (ref 8.4–10.5)
Chloride: 106 mEq/L (ref 96–112)
Creatinine, Ser: 0.6 mg/dL (ref 0.40–1.20)
GFR: 92.5 mL/min (ref >60.00)
Glucose, Bld: 93 mg/dL (ref 70–99)
Potassium: 5 mEq/L (ref 3.5–5.1)
Sodium: 140 mEq/L (ref 135–145)
Total Bilirubin: 0.4 mg/dL (ref 0.2–1.2)
Total Protein: 6.9 g/dL (ref 6.0–8.3)

## 2021-08-01 LAB — TSH: TSH: 2.87 u[IU]/mL (ref 0.35–5.50)

## 2021-08-01 LAB — LIPID PANEL
Cholesterol: 122 mg/dL (ref 0–200)
HDL: 58.6 mg/dL (ref >39.00)
LDL Cholesterol: 43 mg/dL (ref 0–99)
NonHDL: 63.7
Total CHOL/HDL Ratio: 2
Triglycerides: 106 mg/dL (ref 0.0–149.0)
VLDL: 21.2 mg/dL (ref 0.0–40.0)

## 2021-08-01 LAB — FERRITIN: Ferritin: 59.2 ng/mL (ref 10.0–291.0)

## 2021-08-01 LAB — VITAMIN D 25 HYDROXY (VIT D DEFICIENCY, FRACTURES): VITD: 103.36 ng/mL (ref 30.00–100.00)

## 2021-08-01 MED ORDER — ROPINIROLE HCL 0.25 MG PO TABS: 0.2500 mg | ORAL_TABLET | Freq: Every day | ORAL | 5 refills | Status: DC

## 2021-08-01 NOTE — Assessment & Plan Note (Signed)
Chronic Blood pressure well controlled CMP Continue losartan 50 mg daily 

## 2021-08-01 NOTE — Assessment & Plan Note (Signed)
Chronic  Clinically euthyroid Currently taking levothyroxine 88 mcg daily Check tsh  Titrate med dose if needed  

## 2021-08-01 NOTE — Assessment & Plan Note (Signed)
Chronic No symptoms suggestive of angina Continue aspirin 81 mg daily, losartan 50 mg daily, rosuvastatin 5 mg daily and Repatha 140 mg every 2 weeks CBC, CMP, lipids

## 2021-08-01 NOTE — Assessment & Plan Note (Signed)
Chronic Worse in the past 2-3 months-unsure why it has gotten worse Advised her to try discontinuing antihistamines Can take gabapentin at night, which may help Start Requip 0.25 mg at bedtime-can increase to 0.5 mg at bedtime if needed She will update me on how this medication is working

## 2021-08-01 NOTE — Assessment & Plan Note (Signed)
Chronic Much improved after pelvic floor PT Pain was actually pelvic floor muscle spasm She will continue to work on the PT exercises at home Continue gabapentin 300 mg 4 times daily as needed Currently not taking tramadol-can take tramadol if needed-100 mg 4 times daily as needed

## 2021-08-01 NOTE — Assessment & Plan Note (Signed)
Chronic DEXA up-to-date She is walking-continue regular walking Continue vitamin D daily-check vitamin D level

## 2021-08-01 NOTE — Assessment & Plan Note (Signed)
Chronic Regular exercise and healthy diet encouraged Check lipid panel  Continue rosuvastatin 5 mg daily, Repatha 140 mg q. 2 weeks

## 2021-08-03 ENCOUNTER — Encounter: Payer: Self-pay | Admitting: Internal Medicine

## 2021-08-05 DIAGNOSIS — E782 Mixed hyperlipidemia: Secondary | ICD-10-CM

## 2021-08-09 ENCOUNTER — Other Ambulatory Visit: Payer: Self-pay | Admitting: Internal Medicine

## 2021-08-10 MED ORDER — TRAMADOL HCL 50 MG PO TABS: 100.0000 mg | ORAL_TABLET | Freq: Four times a day (QID) | ORAL | 0 refills | Status: AC

## 2021-08-24 ENCOUNTER — Encounter: Payer: Self-pay | Admitting: Internal Medicine

## 2021-08-24 MED ORDER — ROPINIROLE HCL 0.5 MG PO TABS: 0.5000 mg | ORAL_TABLET | Freq: Every day | ORAL | Status: DC

## 2021-09-13 ENCOUNTER — Other Ambulatory Visit: Payer: Self-pay | Admitting: Family Medicine

## 2021-09-13 ENCOUNTER — Encounter: Payer: Self-pay | Admitting: Internal Medicine

## 2021-09-13 ENCOUNTER — Other Ambulatory Visit: Payer: Self-pay | Admitting: Internal Medicine

## 2021-09-13 DIAGNOSIS — Z1231 Encounter for screening mammogram for malignant neoplasm of breast: Secondary | ICD-10-CM

## 2021-09-14 MED ORDER — TRAMADOL HCL 50 MG PO TABS: 100.0000 mg | ORAL_TABLET | Freq: Three times a day (TID) | ORAL | 2 refills | Status: DC | PRN

## 2021-09-16 ENCOUNTER — Encounter: Payer: Self-pay | Admitting: Gastroenterology

## 2021-09-17 ENCOUNTER — Encounter: Payer: Self-pay | Admitting: Internal Medicine

## 2021-10-21 ENCOUNTER — Ambulatory Visit: Payer: Medicare Other

## 2021-10-26 ENCOUNTER — Telehealth: Payer: Self-pay | Admitting: *Deleted

## 2021-10-26 ENCOUNTER — Other Ambulatory Visit: Payer: Self-pay

## 2021-10-26 ENCOUNTER — Ambulatory Visit (AMBULATORY_SURGERY_CENTER): Payer: Medicare Other | Admitting: *Deleted

## 2021-10-26 VITALS — Ht 63.0 in | Wt 112.0 lb

## 2021-10-26 DIAGNOSIS — Z86010 Personal history of colonic polyps: Secondary | ICD-10-CM

## 2021-10-26 MED ORDER — NA SULFATE-K SULFATE-MG SULF 17.5-3.13-1.6 GM/177ML PO SOLN: 1.0000 | Freq: Once | ORAL | 0 refills | Status: AC

## 2021-10-26 MED ORDER — PEG 3350-KCL-NA BICARB-NACL 420 G PO SOLR: 4000.0000 mL | Freq: Once | ORAL | 0 refills | Status: AC

## 2021-10-26 NOTE — Telephone Encounter (Signed)
Spoke with [place] and suprep in generic form cost 82.43 and golytely was at no cost to pt. New rx sent and instructions sent and message left for pt.

## 2021-10-26 NOTE — Telephone Encounter (Signed)
Spoke with pt. And went over new prep instructions and allowed qyestions to be answered.

## 2021-10-26 NOTE — Progress Notes (Signed)

## 2021-10-27 ENCOUNTER — Telehealth: Payer: Self-pay | Admitting: Gastroenterology

## 2021-10-27 ENCOUNTER — Ambulatory Visit: Payer: Self-pay

## 2021-10-27 NOTE — Telephone Encounter (Signed)
Patient called requested a call back from a pre op nurse to discuss the other option she was provided with for her prep.

## 2021-10-27 NOTE — Telephone Encounter (Signed)
Spoke with the patient. She will see if she can get suprep cheaper using single care. [facility] information given to pt. She will call us back if she needs to change preps. Pt did not want to use the Golytely.

## 2021-10-28 NOTE — Telephone Encounter (Signed)
Suprep prep instructions sent to patient's email per pt's request.

## 2021-10-28 NOTE — Telephone Encounter (Signed)
Inbound call from patient with questions about prep

## 2021-10-31 ENCOUNTER — Other Ambulatory Visit: Payer: Self-pay

## 2021-10-31 DIAGNOSIS — E782 Mixed hyperlipidemia: Secondary | ICD-10-CM

## 2021-11-01 ENCOUNTER — Other Ambulatory Visit: Payer: Self-pay | Admitting: *Deleted

## 2021-11-01 DIAGNOSIS — E7800 Pure hypercholesterolemia, unspecified: Secondary | ICD-10-CM

## 2021-11-01 LAB — LIPID PANEL
Chol/HDL Ratio: 2.8 ratio (ref 0.0–4.4)
Cholesterol, Total: 184 mg/dL (ref 100–199)
HDL: 65 mg/dL (ref >39)
LDL Chol Calc (NIH): 93 mg/dL (ref 0–99)
Triglycerides: 150 mg/dL — ABNORMAL HIGH (ref 0–149)
VLDL Cholesterol Cal: 26 mg/dL (ref 5–40)

## 2021-11-01 MED ORDER — ROSUVASTATIN CALCIUM 5 MG PO TABS: 5.0000 mg | ORAL_TABLET | Freq: Every day | ORAL | 3 refills | Status: DC

## 2021-11-03 ENCOUNTER — Encounter: Payer: Self-pay | Admitting: Gastroenterology

## 2021-11-08 ENCOUNTER — Other Ambulatory Visit: Payer: Self-pay | Admitting: Family Medicine

## 2021-11-09 ENCOUNTER — Ambulatory Visit (AMBULATORY_SURGERY_CENTER): Payer: Medicare Other | Admitting: Gastroenterology

## 2021-11-09 ENCOUNTER — Other Ambulatory Visit: Payer: Self-pay

## 2021-11-09 ENCOUNTER — Telehealth: Payer: Self-pay | Admitting: Gastroenterology

## 2021-11-09 ENCOUNTER — Encounter: Payer: Self-pay | Admitting: Gastroenterology

## 2021-11-09 VITALS — BP 133/74 | HR 55 | Temp 97.5°F | Resp 10 | Ht 63.0 in | Wt 112.0 lb

## 2021-11-09 DIAGNOSIS — K635 Polyp of colon: Secondary | ICD-10-CM

## 2021-11-09 DIAGNOSIS — I2510 Atherosclerotic heart disease of native coronary artery without angina pectoris: Secondary | ICD-10-CM | POA: Diagnosis not present

## 2021-11-09 DIAGNOSIS — I10 Essential (primary) hypertension: Secondary | ICD-10-CM | POA: Diagnosis not present

## 2021-11-09 DIAGNOSIS — D122 Benign neoplasm of ascending colon: Secondary | ICD-10-CM | POA: Diagnosis not present

## 2021-11-09 DIAGNOSIS — Z86010 Personal history of colonic polyps: Secondary | ICD-10-CM | POA: Diagnosis not present

## 2021-11-09 MED ORDER — SODIUM CHLORIDE 0.9 % IV SOLN: 500.0000 mL | Freq: Once | INTRAVENOUS | Status: DC

## 2021-11-09 NOTE — Progress Notes (Signed)
Called to room to assist during endoscopic procedure.  Patient ID and intended procedure confirmed with present staff. Received instructions for my participation in the procedure from the performing physician.  

## 2021-11-09 NOTE — Progress Notes (Signed)
Pt's states no medical or surgical changes since previsit or office visit. VS by AS

## 2021-11-09 NOTE — Progress Notes (Signed)
GASTROENTEROLOGY PROCEDURE H&P NOTE   Primary Care Physician: Binnie Rail, MD  HPI: Crystal Barnes is a 71 y.o. female who presents for Colonoscopy for surveillance of colon polyps, prior Rectal TSA s/p piecemeal EMR and prior polyps.    Past Medical History:  Diagnosis Date   Arthritis    CAD (coronary artery disease)    Cataract    bilateral-removed   Hyperlipidemia    Hypertension    Hypothyroidism    Myocardial infarction [facility])    Thyroid disease    Past Surgical History:  Procedure Laterality Date   ABDOMINAL HYSTERECTOMY  2008   CATARACT EXTRACTION Bilateral    COLONOSCOPY     CORONARY STENT PLACEMENT  2014   ENDOSCOPIC MUCOSAL RESECTION N/A 11/24/2019   Procedure: ENDOSCOPIC MUCOSAL RESECTION;  Surgeon: Irving Copas., MD;  Location: [place];  Service: Gastroenterology;  Laterality: N/A;   ENDOSCOPIC MUCOSAL RESECTION  08/04/2020   Procedure: ENDOSCOPIC MUCOSAL RESECTION;  Surgeon: Rush Landmark Telford Nab., MD;  Location: [facility];  Service: Gastroenterology;;   Otho Darner SIGMOIDOSCOPY N/A 11/24/2019   Procedure: Beryle Quant;  Surgeon: Irving Copas., MD;  Location: [place];  Service: Gastroenterology;  Laterality: N/A;   FLEXIBLE SIGMOIDOSCOPY N/A 08/04/2020   Procedure: FLEXIBLE SIGMOIDOSCOPY;  Surgeon: Rush Landmark Telford Nab., MD;  Location: [facility];  Service: Gastroenterology;  Laterality: N/A;   HEMOSTASIS CLIP PLACEMENT  11/24/2019   Procedure: HEMOSTASIS CLIP PLACEMENT;  Surgeon: Irving Copas., MD;  Location: [place];  Service: Gastroenterology;;   HEMOSTASIS CLIP PLACEMENT  08/04/2020   Procedure: HEMOSTASIS CLIP PLACEMENT;  Surgeon: Irving Copas., MD;  Location: [facility];  Service: Gastroenterology;;   POLYPECTOMY     SUBMUCOSAL LIFTING INJECTION  11/24/2019   Procedure: SUBMUCOSAL LIFTING INJECTION;  Surgeon: Irving Copas., MD;  Location: [place];  Service:  Gastroenterology;;   SUBMUCOSAL LIFTING INJECTION  08/04/2020   Procedure: SUBMUCOSAL LIFTING INJECTION;  Surgeon: Irving Copas., MD;  Location: [facility];  Service: Gastroenterology;;   TONSILLECTOMY     Current Outpatient Medications  Medication Sig Dispense Refill   aspirin EC 81 MG tablet Take 1 tablet (81 mg total) by mouth daily. 30 tablet    Calcium-Magnesium-Vitamin D (CALCIUM 1200+D3 PO) Take 1 tablet by mouth daily.     gabapentin (NEURONTIN) 300 MG capsule TAKE 1 CAPSULE BY MOUTH 4  TIMES DAILY 360 capsule 3   levothyroxine (SYNTHROID) 88 MCG tablet Take 1 tablet (88 mcg total) by mouth daily before breakfast. 90 tablet 3   losartan (COZAAR) 50 MG tablet Take 1 tablet (50 mg total) by mouth daily. 90 tablet 3   Multiple Vitamin (MULTIVITAMIN WITH MINERALS) TABS tablet Take 1 tablet by mouth daily.     Polyethyl Glycol-Propyl Glycol (SYSTANE OP) Place 1 drop into both eyes daily as needed (Dry eyes).      rOPINIRole (REQUIP) 0.5 MG tablet Take 1 tablet (0.5 mg total) by mouth at bedtime. (Patient taking differently: Take 1 mg by mouth at bedtime.)     rosuvastatin (CRESTOR) 5 MG tablet Take 1 tablet (5 mg total) by mouth daily. 90 tablet 3   traMADol (ULTRAM) 50 MG tablet Take 2 tablets (100 mg total) by mouth every 8 (eight) hours as needed. 180 tablet 2   Turmeric 500 MG CAPS Take 500 mg by mouth at bedtime.     doxylamine, Sleep, (UNISOM) 25 MG tablet Take 25 mg by mouth at bedtime as needed. (Patient not taking: Reported on 10/26/2021)  Evolocumab (REPATHA SURECLICK) 093 MG/ML SOAJ Inject 140 mg into the skin every 14 (fourteen) days. 2 mL 11   Omega-3 Fatty Acids (FISH OIL) 1360 MG CAPS Take 1,360 mg by mouth at bedtime. (Patient not taking: Reported on 10/26/2021)     Current Facility-Administered Medications  Medication Dose Route Frequency Provider Last Rate Last Admin   0.9 %  sodium chloride infusion  500 mL Intravenous Once Mansouraty, Telford Nab., MD         Current Outpatient Medications:    aspirin EC 81 MG tablet, Take 1 tablet (81 mg total) by mouth daily., Disp: 30 tablet, Rfl:    Calcium-Magnesium-Vitamin D (CALCIUM 1200+D3 PO), Take 1 tablet by mouth daily., Disp: , Rfl:    gabapentin (NEURONTIN) 300 MG capsule, TAKE 1 CAPSULE BY MOUTH 4  TIMES DAILY, Disp: 360 capsule, Rfl: 3   levothyroxine (SYNTHROID) 88 MCG tablet, Take 1 tablet (88 mcg total) by mouth daily before breakfast., Disp: 90 tablet, Rfl: 3   losartan (COZAAR) 50 MG tablet, Take 1 tablet (50 mg total) by mouth daily., Disp: 90 tablet, Rfl: 3   Multiple Vitamin (MULTIVITAMIN WITH MINERALS) TABS tablet, Take 1 tablet by mouth daily., Disp: , Rfl:    Polyethyl Glycol-Propyl Glycol (SYSTANE OP), Place 1 drop into both eyes daily as needed (Dry eyes). , Disp: , Rfl:    rOPINIRole (REQUIP) 0.5 MG tablet, Take 1 tablet (0.5 mg total) by mouth at bedtime. (Patient taking differently: Take 1 mg by mouth at bedtime.), Disp: , Rfl:    rosuvastatin (CRESTOR) 5 MG tablet, Take 1 tablet (5 mg total) by mouth daily., Disp: 90 tablet, Rfl: 3   traMADol (ULTRAM) 50 MG tablet, Take 2 tablets (100 mg total) by mouth every 8 (eight) hours as needed., Disp: 180 tablet, Rfl: 2   Turmeric 500 MG CAPS, Take 500 mg by mouth at bedtime., Disp: , Rfl:    doxylamine, Sleep, (UNISOM) 25 MG tablet, Take 25 mg by mouth at bedtime as needed. (Patient not taking: Reported on 10/26/2021), Disp: , Rfl:    Evolocumab (REPATHA SURECLICK) 267 MG/ML SOAJ, Inject 140 mg into the skin every 14 (fourteen) days., Disp: 2 mL, Rfl: 11   Omega-3 Fatty Acids (FISH OIL) 1360 MG CAPS, Take 1,360 mg by mouth at bedtime. (Patient not taking: Reported on 10/26/2021), Disp: , Rfl:   Current Facility-Administered Medications:    0.9 %  sodium chloride infusion, 500 mL, Intravenous, Once, Mansouraty, Telford Nab., MD No Known Allergies Family History  Problem Relation Age of Onset   Colon polyps Mother    Esophageal cancer  Father    Colon cancer Neg Hx    Rectal cancer Neg Hx    Stomach cancer Neg Hx    Inflammatory bowel disease Neg Hx    Liver disease Neg Hx    Pancreatic cancer Neg Hx    Social History   Socioeconomic History   Marital status: Single    Spouse name: Not on file   Number of children: Not on file   Years of education: Not on file   Highest education level: Not on file  Occupational History   Occupation: Retired     Comment: about a year and half ago  Tobacco Use   Smoking status: Former    Years: 36.00    Types: Cigarettes   Smokeless tobacco: Never  Vaping Use   Vaping Use: Never used  Substance and Sexual Activity   Alcohol use: Not Currently  Drug use: Never   Sexual activity: Not Currently  Other Topics Concern   Not on file  Social History Narrative   Not on file   Social Determinants of Health   Financial Resource Strain: Low Risk    Difficulty of Paying Living Expenses: Not hard at all  Food Insecurity: No Food Insecurity   Worried About Running Out of Food in the Last Year: Never true   [place] in the Last Year: Never true  Transportation Needs: No Transportation Needs   Lack of Transportation (Medical): No   Lack of Transportation (Non-Medical): No  Physical Activity: Sufficiently Active   Days of Exercise per Week: 7 days   Minutes of Exercise per Session: 60 min  Stress: No Stress Concern Present   Feeling of Stress : Not at all  Social Connections: Moderately Integrated   Frequency of Communication with Friends and Family: More than three times a week   Frequency of Social Gatherings with Friends and Family: More than three times a week   Attends Religious Services: More than 4 times per year   Active Member of Clubs or Organizations: Yes   Attends Music therapist: More than 4 times per year   Marital Status: Never married  Intimate Partner Violence: Not At Risk   Fear of Current or Ex-Partner: No   Emotionally Abused: No    Physically Abused: No   Sexually Abused: No    Physical Exam: Today's Vitals   11/09/21 0803 11/09/21 0805  BP: 140/80   Pulse: 70   Temp: (!) 97.5 F (36.4 C) (!) 97.5 F (36.4 C)  TempSrc: Temporal   SpO2: 97%   Weight: 112 lb (50.8 kg)   Height: 5\' 3"  (1.6 m)    Body mass index is 19.84 kg/m. GEN: NAD EYE: Sclerae anicteric ENT: MMM CV: Non-tachycardic GI: Soft, NT/ND NEURO:  Alert & Oriented x 3  Lab Results: No results for input(s): WBC, HGB, HCT, PLT in the last 72 hours. BMET No results for input(s): NA, K, CL, CO2, GLUCOSE, BUN, CREATININE, CALCIUM in the last 72 hours. LFT No results for input(s): PROT, ALBUMIN, AST, ALT, ALKPHOS, BILITOT, BILIDIR, IBILI in the last 72 hours. PT/INR No results for input(s): LABPROT, INR in the last 72 hours.   Impression / Plan: This is a 71 y.o.female who presents for Colonoscopy for surveillance of colon polyps, prior Rectal TSA s/p piecemeal EMR and prior polyps.    The risks and benefits of endoscopic evaluation/treatment were discussed with the patient and/or family; these include but are not limited to the risk of perforation, infection, bleeding, missed lesions, lack of diagnosis, severe illness requiring hospitalization, as well as anesthesia and sedation related illnesses.  The patient's history has been reviewed, patient examined, no change in status, and deemed stable for procedure.  The patient and/or family is agreeable to proceed.    Justice Britain, MD [facility] Advanced [facility]oscopy Office # [phone]

## 2021-11-09 NOTE — Patient Instructions (Signed)
Please read handouts provided. Continue present medications. Await pathology results. High Fiber Diet. Use FiberCon 1-2 tablets daily. Repeat colonoscopy in 5 years.   YOU HAD AN ENDOSCOPIC PROCEDURE TODAY AT [facility]:   Refer to the procedure report that was given to you for any specific questions about what was found during the examination.  If the procedure report does not answer your questions, please call your gastroenterologist to clarify.  If you requested that your care partner not be given the details of your procedure findings, then the procedure report has been included in a sealed envelope for you to review at your convenience later.  YOU SHOULD EXPECT: Some feelings of bloating in the abdomen. Passage of more gas than usual.  Walking can help get rid of the air that was put into your GI tract during the procedure and reduce the bloating. If you had a lower endoscopy (such as a colonoscopy or flexible sigmoidoscopy) you may notice spotting of blood in your stool or on the toilet paper. If you underwent a bowel prep for your procedure, you may not have a normal bowel movement for a few days.  Please Note:  You might notice some irritation and congestion in your nose or some drainage.  This is from the oxygen used during your procedure.  There is no need for concern and it should clear up in a day or so.  SYMPTOMS TO REPORT IMMEDIATELY:  Following lower endoscopy (colonoscopy or flexible sigmoidoscopy):  Excessive amounts of blood in the stool  Significant tenderness or worsening of abdominal pains  Swelling of the abdomen that is new, acute  Fever of 100F or higher   For urgent or emergent issues, a gastroenterologist can be reached at any hour by calling [phone]. Do not use MyChart messaging for urgent concerns.    DIET:  We do recommend a small meal at first, but then you may proceed to your regular diet.  Drink plenty of fluids but you should avoid  alcoholic beverages for 24 hours.  ACTIVITY:  You should plan to take it easy for the rest of today and you should NOT DRIVE or use heavy machinery until tomorrow (because of the sedation medicines used during the test).    FOLLOW UP: Our staff will call the number listed on your records 48-72 hours following your procedure to check on you and address any questions or concerns that you may have regarding the information given to you following your procedure. If we do not reach you, we will leave a message.  We will attempt to reach you two times.  During this call, we will ask if you have developed any symptoms of COVID 19. If you develop any symptoms (ie: fever, flu-like symptoms, shortness of breath, cough etc.) before then, please call [phone].  If you test positive for Covid 19 in the 2 weeks post procedure, please call and report this information to [place].    If any biopsies were taken you will be contacted by phone or by letter within the next 1-3 weeks.  Please call us at [phone] if you have not heard about the biopsies in 3 weeks.    SIGNATURES/CONFIDENTIALITY: You and/or your care partner have signed paperwork which will be entered into your electronic medical record.  These signatures attest to the fact that that the information above on your After Visit Summary has been reviewed and is understood.  Full responsibility of the confidentiality of this discharge information  lies with you and/or your care-partner.

## 2021-11-09 NOTE — Op Note (Signed)
[facility] Patient Name: Crystal Barnes Procedure Date: 11/09/2021 8:35 AM MRN: [id number] Endoscopist: Justice Britain , MD Age: 71 Referring MD:  Date of Birth: September 29, 1951 Gender: Female Account #: [id number] Procedure:                Colonoscopy Indications:              Surveillance: Personal history of adenomatous                            polyps on last colonoscopy 3 years ago (prior large                            EMR Rectal TSA in 2021) and full colonoscopy in 2020 Medicines:                Monitored Anesthesia Care Procedure:                Pre-Anesthesia Assessment:                           - Prior to the procedure, a History and Physical                            was performed, and patient medications and                            allergies were reviewed. The patient's tolerance of                            previous anesthesia was also reviewed. The risks                            and benefits of the procedure and the sedation                            options and risks were discussed with the patient.                            All questions were answered, and informed consent                            was obtained. Prior Anticoagulants: The patient has                            taken no previous anticoagulant or antiplatelet                            agents except for aspirin. ASA Grade Assessment: II                            - A patient with mild systemic disease. After                            reviewing the risks and benefits, the patient was  deemed in satisfactory condition to undergo the                            procedure.                           After obtaining informed consent, the colonoscope                            was passed under direct vision. Throughout the                            procedure, the patient's blood pressure, pulse, and                            oxygen saturations were monitored  continuously. The                            Olympus PCF-[id number] ([id number]) Colonoscope was                            introduced through the anus and advanced to the the                            cecum, identified by appendiceal orifice and                            ileocecal valve. The colonoscopy was extremely                            difficult due to a redundant colon, significant                            looping and a tortuous colon. Successful completion                            of the procedure was aided by changing the                            patient's position, using manual pressure,                            withdrawing and reinserting the scope,                            straightening and shortening the scope to obtain                            bowel loop reduction and using scope torsion. The                            patient tolerated the procedure. The quality of the  bowel preparation was adequate. The ileocecal                            valve, appendiceal orifice, and rectum were                            photographed. Scope In: 8:44:55 AM Scope Out: 9:10:42 AM Scope Withdrawal Time: 0 hours 11 minutes 40 seconds  Total Procedure Duration: 0 hours 25 minutes 47 seconds  Findings:                 The digital rectal exam findings include                            hemorrhoids. Pertinent negatives include no                            palpable rectal lesions.                           The colon (entire examined portion) was grossly                            redundant and loopy.                           A large amount of liquid stool was found in the                            entire colon, making visualization difficult.                            Lavage of the area was performed using copious                            amounts, resulting in clearance with adequate                            visualization.                            A 3 mm polyp was found in the ascending colon. The                            polyp was sessile. The polyp was removed with a                            cold snare. Resection and retrieval were complete.                           A large post mucosectomy scar was found in the                            rectum. The scar tissue was healthy in appearance.  There was no evidence of the previous polyp.                           Normal mucosa was found in the entire colon                            otherwise.                           Non-bleeding non-thrombosed external and internal                            hemorrhoids were found during retroflexion, during                            perianal exam and during digital exam. The                            hemorrhoids were Grade II (internal hemorrhoids                            that prolapse but reduce spontaneously). Complications:            No immediate complications. Estimated Blood Loss:     Estimated blood loss was minimal. Impression:               - Hemorrhoids found on digital rectal exam.                           - Redundant colon.                           - Stool in the entire examined colon.                           - One 3 mm polyp in the ascending colon, removed                            with a cold snare. Resected and retrieved.                           - Post mucosectomy scar in the rectum.                           - Normal mucosa in the entire examined colon                            otherwise.                           - Non-bleeding non-thrombosed external and internal                            hemorrhoids. Recommendation:           - The patient will be observed post-procedure,  until all discharge criteria are met.                           - Discharge patient to home.                           - Patient has a contact number available for                             emergencies. The signs and symptoms of potential                            delayed complications were discussed with the                            patient. Return to normal activities tomorrow.                            Written discharge instructions were provided to the                            patient.                           - High fiber diet.                           - Use FiberCon 1-2 tablets PO daily.                           - Continue present medications.                           - Await pathology results.                           - Repeat colonoscopy in 5 years for surveillance.                            No matter pathology due to history of a previously                            removed Advanced Adenoma. Consider Adult                            colonoscope for next procedure and possible                            abdominal binder. Also consider additional 1/2 to 1                            full day of preparation in addition to normal                            preparation for next procedure or Miralax 1-2 times  daily the week prior.                           - The findings and recommendations were discussed                            with the patient.                           - The findings and recommendations were discussed                            with the patient's family. Justice Britain, MD 11/09/2021 9:19:09 AM

## 2021-11-09 NOTE — Progress Notes (Signed)
Report to PACU, RN, vss, BBS= Clear.  

## 2021-11-09 NOTE — Telephone Encounter (Signed)
Pt taking bowel prep for colonoscopy tomorrow with GM. She does not feel she has had an adequate response to the first prep dose. Advised her to take 2 glasses of Miralax now and complete the second prep dose as instructed.

## 2021-11-10 ENCOUNTER — Ambulatory Visit
Admission: RE | Admit: 2021-11-10 | Discharge: 2021-11-10 | Disposition: A | Discharge status: Home / Self Care | Payer: Medicare Other | Source: Ambulatory Visit | Attending: Internal Medicine | Admitting: Internal Medicine

## 2021-11-10 DIAGNOSIS — Z1231 Encounter for screening mammogram for malignant neoplasm of breast: Secondary | ICD-10-CM

## 2021-11-11 ENCOUNTER — Telehealth: Payer: Self-pay | Admitting: *Deleted

## 2021-11-11 NOTE — Telephone Encounter (Signed)
Attempted f/u phone call. No answer. Left message. °

## 2021-11-11 NOTE — Telephone Encounter (Signed)
Attempted 2nd f/u phone call. No answer. Left message.  °

## 2021-11-12 ENCOUNTER — Encounter: Payer: Self-pay | Admitting: Cardiovascular Disease

## 2021-11-14 ENCOUNTER — Encounter: Payer: Self-pay | Admitting: Gastroenterology

## 2021-11-15 ENCOUNTER — Encounter: Payer: Self-pay | Admitting: Cardiovascular Disease

## 2021-11-21 ENCOUNTER — Encounter: Payer: Self-pay | Admitting: Internal Medicine

## 2021-11-21 MED ORDER — ROPINIROLE HCL 0.5 MG PO TABS: 0.5000 mg | ORAL_TABLET | Freq: Every day | ORAL | 2 refills | Status: DC

## 2021-12-08 ENCOUNTER — Encounter: Payer: Self-pay | Admitting: Internal Medicine

## 2021-12-08 ENCOUNTER — Other Ambulatory Visit: Payer: Self-pay | Admitting: Internal Medicine

## 2021-12-14 ENCOUNTER — Other Ambulatory Visit: Payer: Self-pay

## 2021-12-14 MED ORDER — ROPINIROLE HCL 0.5 MG PO TABS: 0.5000 mg | ORAL_TABLET | Freq: Every day | ORAL | 2 refills | Status: DC

## 2021-12-14 MED ORDER — LEVOTHYROXINE SODIUM 88 MCG PO TABS: 88.0000 ug | ORAL_TABLET | Freq: Every day | ORAL | 3 refills | Status: DC

## 2021-12-14 MED ORDER — LOSARTAN POTASSIUM 50 MG PO TABS: 50.0000 mg | ORAL_TABLET | Freq: Every day | ORAL | 3 refills | Status: DC

## 2022-01-27 ENCOUNTER — Encounter: Payer: Self-pay | Admitting: Cardiovascular Disease

## 2022-01-30 ENCOUNTER — Other Ambulatory Visit: Payer: Self-pay

## 2022-01-30 DIAGNOSIS — E7800 Pure hypercholesterolemia, unspecified: Secondary | ICD-10-CM

## 2022-01-31 ENCOUNTER — Other Ambulatory Visit: Payer: Self-pay

## 2022-01-31 ENCOUNTER — Encounter: Payer: Self-pay | Admitting: Cardiovascular Disease

## 2022-01-31 LAB — LIPID PANEL
Chol/HDL Ratio: 1.8 ratio (ref 0.0–4.4)
Cholesterol, Total: 122 mg/dL (ref 100–199)
HDL: 66 mg/dL (ref >39)
LDL Chol Calc (NIH): 38 mg/dL (ref 0–99)
Triglycerides: 97 mg/dL (ref 0–149)
VLDL Cholesterol Cal: 18 mg/dL (ref 5–40)

## 2022-01-31 MED ORDER — ROSUVASTATIN CALCIUM 5 MG PO TABS: 5.0000 mg | ORAL_TABLET | ORAL | 3 refills | Status: DC

## 2022-01-31 NOTE — Telephone Encounter (Signed)
Please reduce the rosuvastatin to 5 mg every other day (but we cannot stop it altogether). Thank you ?

## 2022-02-01 ENCOUNTER — Other Ambulatory Visit: Payer: Self-pay | Admitting: Family Medicine

## 2022-02-04 ENCOUNTER — Other Ambulatory Visit: Payer: Self-pay | Admitting: Cardiovascular Disease

## 2022-02-15 ENCOUNTER — Other Ambulatory Visit: Payer: Self-pay | Admitting: Family Medicine

## 2022-02-21 ENCOUNTER — Encounter: Payer: Self-pay | Admitting: Internal Medicine

## 2022-02-22 ENCOUNTER — Other Ambulatory Visit: Payer: Self-pay

## 2022-02-22 MED ORDER — GABAPENTIN 300 MG PO CAPS: ORAL_CAPSULE | ORAL | 3 refills | Status: DC

## 2022-02-24 ENCOUNTER — Encounter: Payer: Self-pay | Admitting: Internal Medicine

## 2022-02-27 ENCOUNTER — Ambulatory Visit: Payer: Medicare Other | Admitting: Internal Medicine

## 2022-03-09 ENCOUNTER — Encounter: Payer: Self-pay | Admitting: Internal Medicine

## 2022-03-09 ENCOUNTER — Other Ambulatory Visit: Payer: Self-pay | Admitting: Internal Medicine

## 2022-03-14 ENCOUNTER — Encounter: Payer: Self-pay | Admitting: Internal Medicine

## 2022-03-14 NOTE — Progress Notes (Unsigned)
Subjective:    Patient ID: Crystal Barnes, female    DOB: [birth year], 71 y.o.   MRN: [id number]     HPI Crystal Barnes is here for follow up of her chronic medical problems, including CAD, htn, hypothyroidism, HLD, RLS, chronic pain pelvic floor dysfunction  She is exercising regularly.   She is eating well.  Overall feels good.    Medications and allergies reviewed with patient and updated if appropriate.  Current Outpatient Medications on File Prior to Visit  Medication Sig Dispense Refill   aspirin EC 81 MG tablet Take 1 tablet (81 mg total) by mouth daily. 30 tablet    Calcium-Magnesium-Vitamin D (CALCIUM 1200+D3 PO) Take 1 tablet by mouth daily.     gabapentin (NEURONTIN) 300 MG capsule TAKE 1 CAPSULE BY MOUTH 4  TIMES DAILY 360 capsule 3   levothyroxine (SYNTHROID) 88 MCG tablet Take 1 tablet (88 mcg total) by mouth daily before breakfast. 90 tablet 3   losartan (COZAAR) 50 MG tablet Take 1 tablet (50 mg total) by mouth daily. 90 tablet 3   Multiple Vitamin (MULTIVITAMIN WITH MINERALS) TABS tablet Take 1 tablet by mouth daily.     Polyethyl Glycol-Propyl Glycol (SYSTANE OP) Place 1 drop into both eyes daily as needed (Dry eyes).      REPATHA SURECLICK 335 MG/ML SOAJ INJECT 140 MG INTO THE SKIN EVERY 14 DAYS 2 mL 11   rOPINIRole (REQUIP) 0.5 MG tablet Take 1-2 tablets (0.5-1 mg total) by mouth at bedtime. 180 tablet 2   rosuvastatin (CRESTOR) 5 MG tablet Take 1 tablet (5 mg total) by mouth every other day. (Patient taking differently: Take 5 mg by mouth every other day. Patient takes 2.5 mg daily) 45 tablet 3   traMADol (ULTRAM) 50 MG tablet TAKE 2 TABLETS BY MOUTH EVERY 8 HOURS AS NEEDED 180 tablet 0   Turmeric 500 MG CAPS Take 500 mg by mouth at bedtime.     No current facility-administered medications on file prior to visit.     Review of Systems  Constitutional:  Negative for fever.  Respiratory:  Negative for cough, shortness of breath and wheezing.   Cardiovascular:   Negative for chest pain, palpitations and leg swelling.  Neurological:  Negative for light-headedness and headaches.      Objective:   Vitals:   03/15/22 1453  BP: 122/78  Pulse: 78  Temp: 98 F (36.7 C)  SpO2: 98%   BP Readings from Last 3 Encounters:  03/15/22 122/78  11/09/21 133/74  08/01/21 118/78   Wt Readings from Last 3 Encounters:  03/15/22 111 lb 9.6 oz (50.6 kg)  11/09/21 112 lb (50.8 kg)  10/26/21 112 lb (50.8 kg)   Body mass index is 20.41 kg/m.    Physical Exam Constitutional:      General: She is not in acute distress.    Appearance: Normal appearance.  HENT:     Head: Normocephalic and atraumatic.  Eyes:     Conjunctiva/sclera: Conjunctivae normal.  Cardiovascular:     Rate and Rhythm: Normal rate and regular rhythm.     Heart sounds: Normal heart sounds. No murmur heard. Pulmonary:     Effort: Pulmonary effort is normal. No respiratory distress.     Breath sounds: Normal breath sounds. No wheezing.  Musculoskeletal:     Cervical back: Neck supple.     Right lower leg: No edema.     Left lower leg: No edema.  Lymphadenopathy:  Cervical: No cervical adenopathy.  Skin:    General: Skin is warm and dry.     Findings: No rash.  Neurological:     Mental Status: She is alert. Mental status is at baseline.  Psychiatric:        Mood and Affect: Mood normal.        Behavior: Behavior normal.       Lab Results  Component Value Date   WBC 5.1 08/01/2021   HGB 14.8 08/01/2021   HCT 44.3 08/01/2021   PLT 293.0 08/01/2021   GLUCOSE 93 08/01/2021   CHOL 122 01/30/2022   TRIG 97 01/30/2022   HDL 66 01/30/2022   LDLCALC 38 01/30/2022   ALT 28 08/01/2021   AST 32 08/01/2021   NA 140 08/01/2021   K 5.0 08/01/2021   CL 106 08/01/2021   CREATININE 0.60 08/01/2021   BUN 18 08/01/2021   CO2 25 08/01/2021   TSH 2.87 08/01/2021   HGBA1C 4.8 07/23/2020   MICROALBUR 0.2 07/23/2020     Assessment & Plan:    See Problem List for Assessment  and Plan of chronic medical problems.

## 2022-03-14 NOTE — Patient Instructions (Addendum)
     Blood work was ordered.     Medications changes include :      Your prescription(s) have been sent to your pharmacy.    A referral was ordered for XX.     Someone from that office will call you to schedule an appointment.    Return in about 6 months (around 09/14/2022) for follow up.  

## 2022-03-15 ENCOUNTER — Ambulatory Visit (INDEPENDENT_AMBULATORY_CARE_PROVIDER_SITE_OTHER): Payer: Medicare Other | Admitting: Internal Medicine

## 2022-03-15 ENCOUNTER — Encounter: Payer: Self-pay | Admitting: Internal Medicine

## 2022-03-15 VITALS — BP 122/78 | HR 78 | Temp 98.0°F | Ht 62.0 in | Wt 111.6 lb

## 2022-03-15 DIAGNOSIS — I2510 Atherosclerotic heart disease of native coronary artery without angina pectoris: Secondary | ICD-10-CM | POA: Diagnosis not present

## 2022-03-15 DIAGNOSIS — G2581 Restless legs syndrome: Secondary | ICD-10-CM

## 2022-03-15 DIAGNOSIS — G8929 Other chronic pain: Secondary | ICD-10-CM

## 2022-03-15 DIAGNOSIS — I10 Essential (primary) hypertension: Secondary | ICD-10-CM | POA: Diagnosis not present

## 2022-03-15 DIAGNOSIS — E782 Mixed hyperlipidemia: Secondary | ICD-10-CM

## 2022-03-15 DIAGNOSIS — E039 Hypothyroidism, unspecified: Secondary | ICD-10-CM | POA: Diagnosis not present

## 2022-03-15 LAB — COMPREHENSIVE METABOLIC PANEL
ALT: 28 U/L (ref 0–35)
AST: 26 U/L (ref 0–37)
Albumin: 4.3 g/dL (ref 3.5–5.2)
Alkaline Phosphatase: 52 U/L (ref 39–117)
BUN: 17 mg/dL (ref 6–23)
CO2: 30 mEq/L (ref 19–32)
Calcium: 10 mg/dL (ref 8.4–10.5)
Chloride: 105 mEq/L (ref 96–112)
Creatinine, Ser: 0.64 mg/dL (ref 0.40–1.20)
GFR: 90.67 mL/min (ref >60.00)
Glucose, Bld: 82 mg/dL (ref 70–99)
Potassium: 5.7 mEq/L — ABNORMAL HIGH (ref 3.5–5.1)
Sodium: 139 mEq/L (ref 135–145)
Total Bilirubin: 0.5 mg/dL (ref 0.2–1.2)
Total Protein: 6.4 g/dL (ref 6.0–8.3)

## 2022-03-15 LAB — LIPID PANEL
Cholesterol: 134 mg/dL (ref 0–200)
HDL: 62.8 mg/dL (ref >39.00)
LDL Cholesterol: 54 mg/dL (ref 0–99)
NonHDL: 71.31
Total CHOL/HDL Ratio: 2
Triglycerides: 87 mg/dL (ref 0.0–149.0)
VLDL: 17.4 mg/dL (ref 0.0–40.0)

## 2022-03-15 LAB — TSH: TSH: 4.97 u[IU]/mL (ref 0.35–5.50)

## 2022-03-15 IMAGING — MG MM DIGITAL SCREENING BILAT W/ TOMO AND CAD
8 series · 9 of 24 positions shown · non-contrast
Comparison: Previous exam(s).

CLINICAL DATA: Screening.

EXAM:
DIGITAL SCREENING BILATERAL MAMMOGRAM WITH TOMOSYNTHESIS AND CAD
TECHNIQUE: Bilateral screening digital craniocaudal and mediolateral oblique
mammograms were obtained. Bilateral screening digital breast
tomosynthesis was performed. The images were evaluated with
computer-aided detection.

[R CC synth-2D]
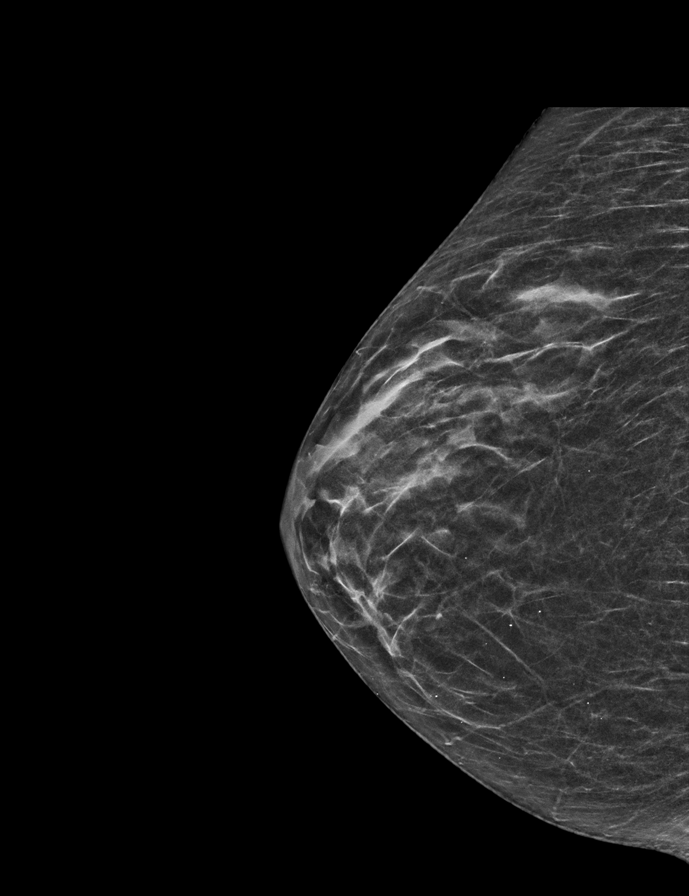

[L MLO synth-2D]
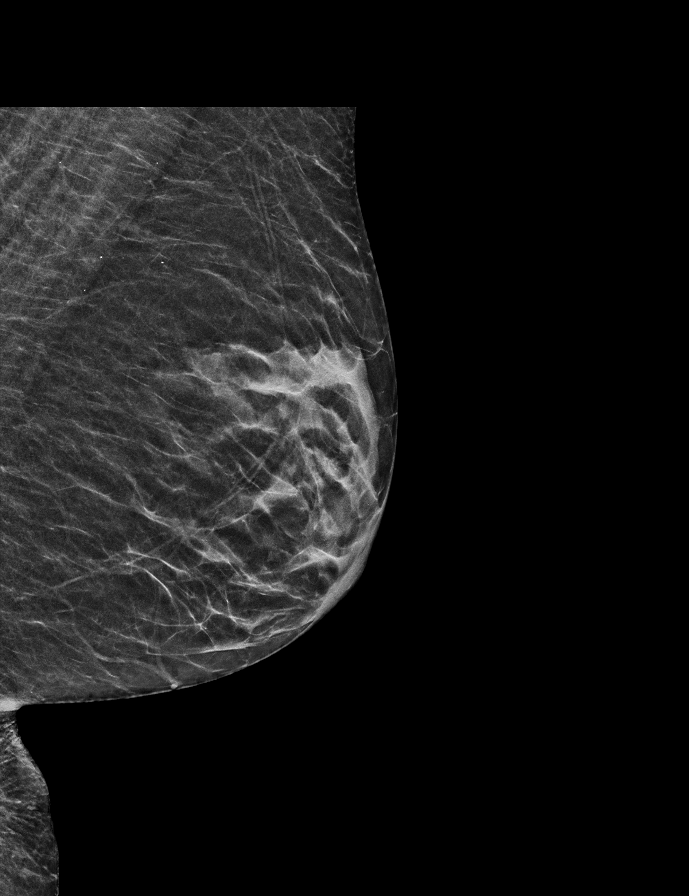

[R MLO synth-2D]
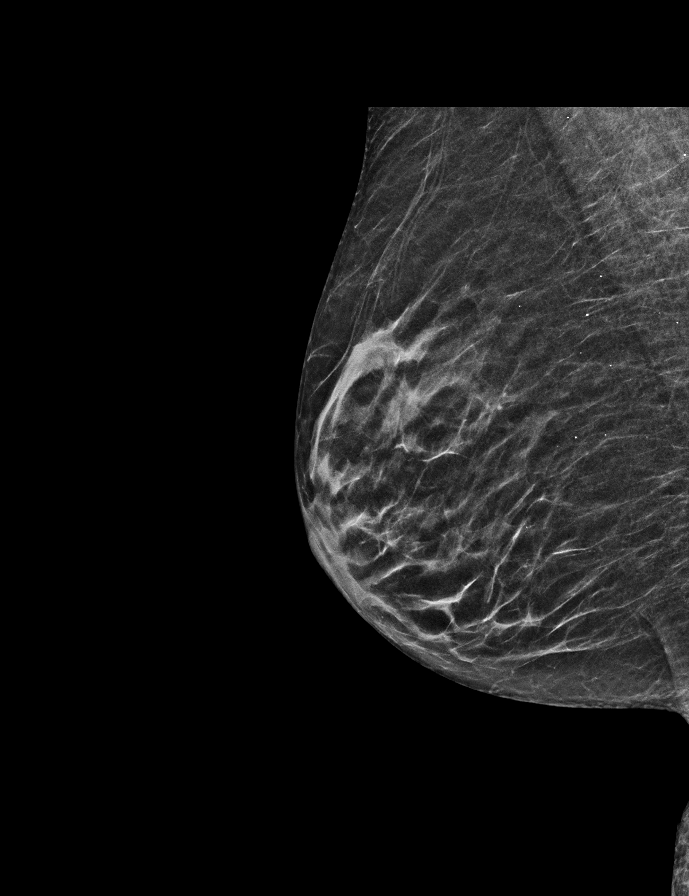

[L CC synth-2D]
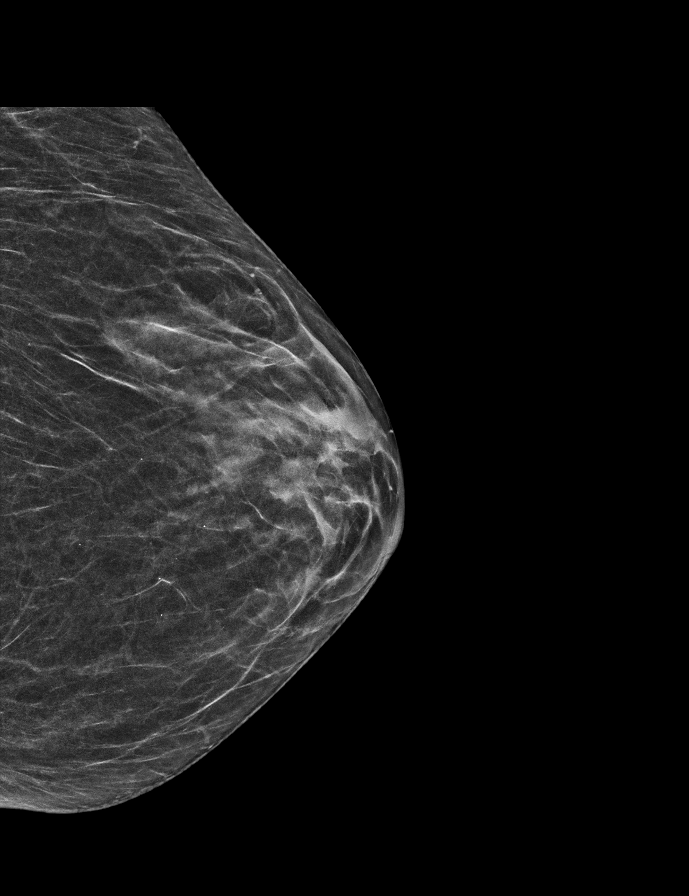

[R CC tomo · 2 of 43 frames shown]
[frame 14/43]
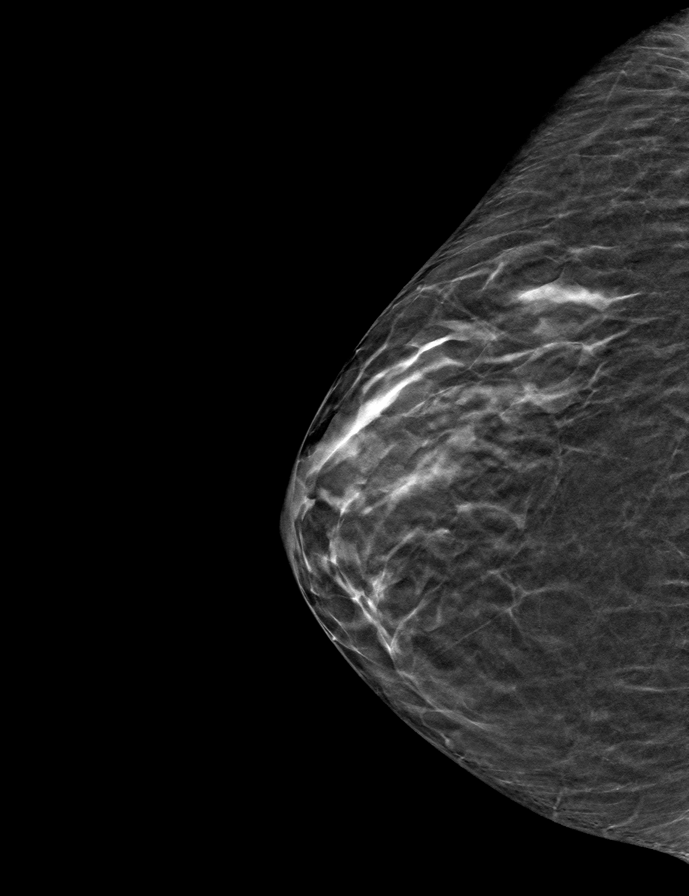
[frame 22/43]
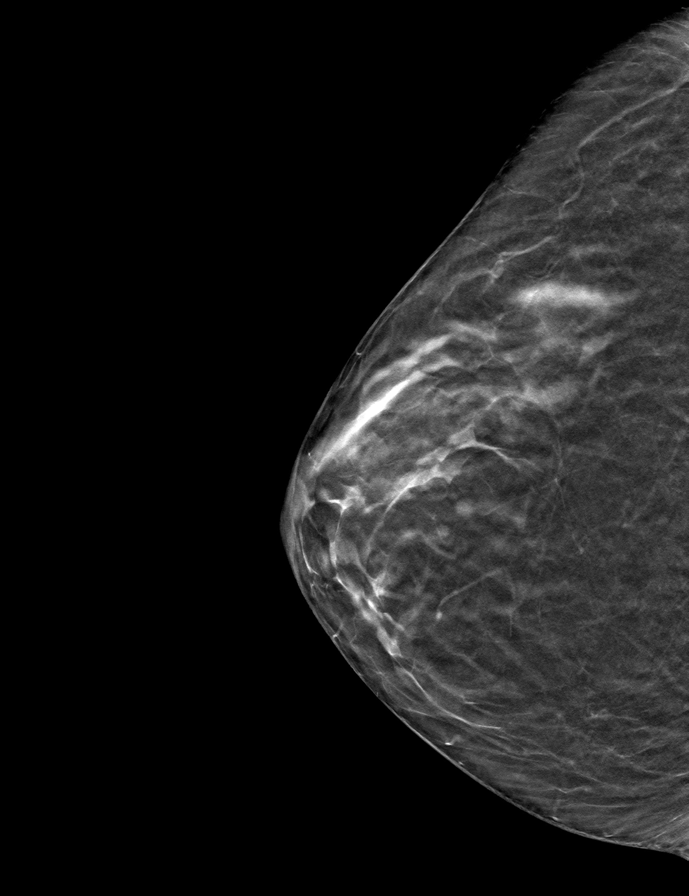

[L CC tomo · tomo slice 23/45.0]
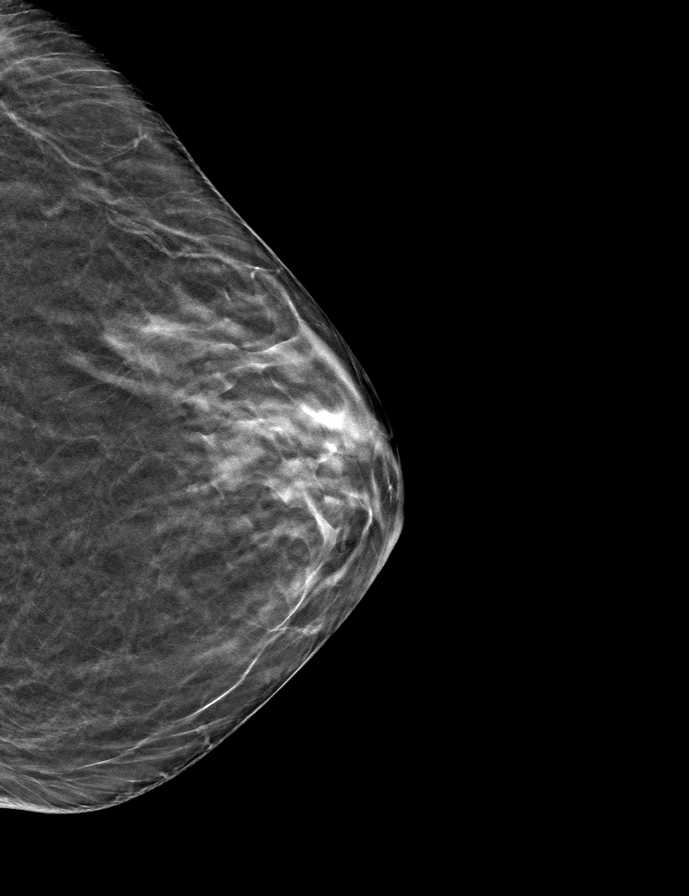

[L MLO tomo · tomo slice 20/39.0]
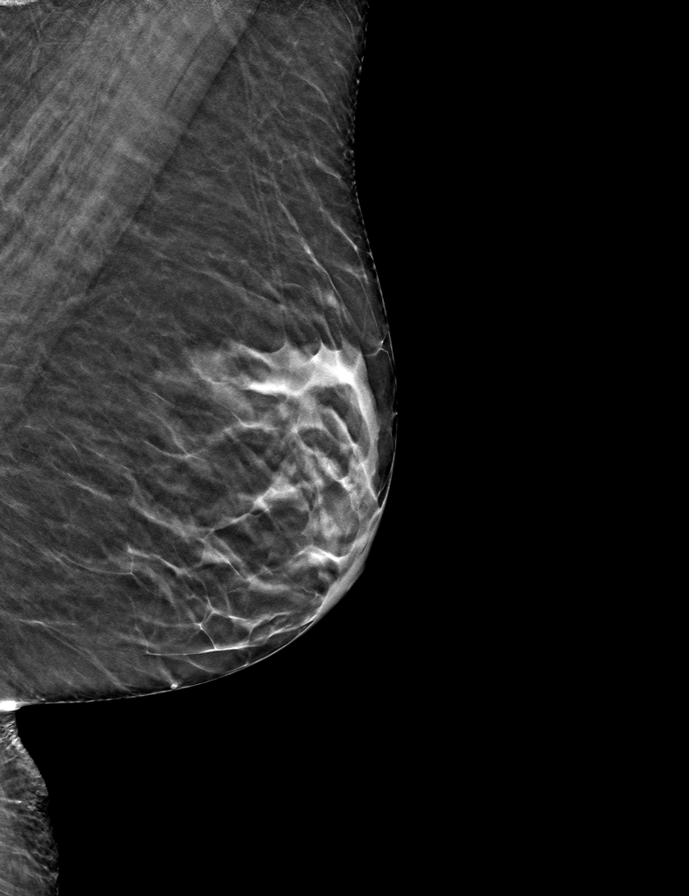

[R MLO tomo · tomo slice 20/39.0]
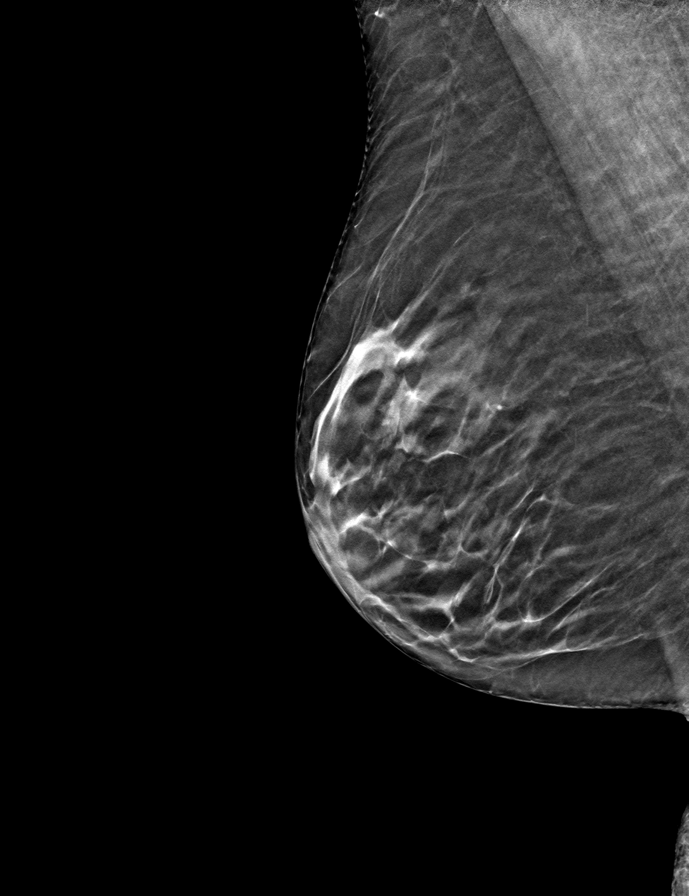

[9 of 24 positions shown; findings below may reference images not displayed]

ACR Breast Density Category b: There are scattered areas of
fibroglandular density.
FINDINGS: There are no findings suspicious for malignancy.
IMPRESSION: No mammographic evidence of malignancy. A result letter of this
screening mammogram will be mailed directly to the patient.

RECOMMENDATION:
Screening mammogram in one year. (Code:51-O-LD2)

BI-RADS CATEGORY  1: Negative.

## 2022-03-15 NOTE — Assessment & Plan Note (Signed)
Chronic BP well controlled - she wonders if she can decrease her losartan - BP good at home consistently  Continue losartan - she will do a trial of decreasing losartan to 25 mg daily and monitor BP to make sure BP remains controlled cmp

## 2022-03-15 NOTE — Assessment & Plan Note (Addendum)
Chronic On repatha 140 mg q 14 days Regular exercise and healthy diet encouraged

## 2022-03-15 NOTE — Assessment & Plan Note (Signed)
chronic No symptoms c/w angina Continue current medications

## 2022-03-15 NOTE — Assessment & Plan Note (Signed)
Chronic Controlled, stable Continue gabapentin 300 mg QID, tramadol 50 mg TID

## 2022-03-15 NOTE — Assessment & Plan Note (Signed)
Chronic  Clinically euthyroid Currently taking levothyroxine 88 mcg daily Check tsh  Titrate med dose if needed  

## 2022-03-15 NOTE — Assessment & Plan Note (Signed)
Chronic Controlled, stable Continue requip 0.5 mg nightly

## 2022-03-17 ENCOUNTER — Encounter: Payer: Self-pay | Admitting: Internal Medicine

## 2022-03-19 ENCOUNTER — Encounter: Payer: Self-pay | Admitting: Internal Medicine

## 2022-03-19 DIAGNOSIS — E875 Hyperkalemia: Secondary | ICD-10-CM

## 2022-03-19 MED ORDER — HYDROCHLOROTHIAZIDE 12.5 MG PO TABS: 12.5000 mg | ORAL_TABLET | Freq: Every day | ORAL | 5 refills | Status: DC

## 2022-03-20 ENCOUNTER — Telehealth: Payer: Self-pay | Admitting: Cardiovascular Disease

## 2022-03-20 MED ORDER — LOSARTAN POTASSIUM 50 MG PO TABS
25.0000 mg | ORAL_TABLET | Freq: Every day | ORAL | 3 refills | Status: DC
Start: 2022-03-20 — End: 2022-03-31

## 2022-03-20 NOTE — Telephone Encounter (Signed)
Pt c/o medication issue:  1. Name of Medication: REPATHA SURECLICK 624 MG/ML SOAJ  2. How are you currently taking this medication (dosage and times per day)? Every 14 days  3. Are you having a reaction (difficulty breathing--STAT)? Yes  4. What is your medication issue?  Pt states heart "tickles" periodically and has been going on for about a month.  She thinks this medication is too strong and would like a lower dosage or an alternate injectable.

## 2022-03-23 ENCOUNTER — Encounter: Payer: Self-pay | Admitting: Internal Medicine

## 2022-03-27 ENCOUNTER — Other Ambulatory Visit (INDEPENDENT_AMBULATORY_CARE_PROVIDER_SITE_OTHER): Payer: Medicare Other

## 2022-03-27 ENCOUNTER — Encounter: Payer: Self-pay | Admitting: Internal Medicine

## 2022-03-27 DIAGNOSIS — E875 Hyperkalemia: Secondary | ICD-10-CM

## 2022-03-27 LAB — BASIC METABOLIC PANEL
BUN: 22 mg/dL (ref 6–23)
CO2: 29 mEq/L (ref 19–32)
Calcium: 9.3 mg/dL (ref 8.4–10.5)
Chloride: 104 mEq/L (ref 96–112)
Creatinine, Ser: 0.74 mg/dL (ref 0.40–1.20)
GFR: 82.99 mL/min (ref >60.00)
Glucose, Bld: 96 mg/dL (ref 70–99)
Potassium: 6 mEq/L — ABNORMAL HIGH (ref 3.5–5.1)
Sodium: 138 mEq/L (ref 135–145)

## 2022-04-06 ENCOUNTER — Encounter: Payer: Self-pay | Admitting: Cardiovascular Disease

## 2022-04-06 ENCOUNTER — Ambulatory Visit: Payer: Medicare Other | Admitting: Cardiovascular Disease

## 2022-04-06 ENCOUNTER — Telehealth: Payer: Self-pay

## 2022-04-06 VITALS — BP 142/80 | HR 62 | Ht 62.0 in | Wt 113.0 lb

## 2022-04-06 DIAGNOSIS — E039 Hypothyroidism, unspecified: Secondary | ICD-10-CM | POA: Diagnosis not present

## 2022-04-06 DIAGNOSIS — I2510 Atherosclerotic heart disease of native coronary artery without angina pectoris: Secondary | ICD-10-CM | POA: Diagnosis not present

## 2022-04-06 DIAGNOSIS — E875 Hyperkalemia: Secondary | ICD-10-CM

## 2022-04-06 DIAGNOSIS — E7800 Pure hypercholesterolemia, unspecified: Secondary | ICD-10-CM | POA: Diagnosis not present

## 2022-04-06 DIAGNOSIS — I10 Essential (primary) hypertension: Secondary | ICD-10-CM | POA: Diagnosis not present

## 2022-04-06 DIAGNOSIS — R002 Palpitations: Secondary | ICD-10-CM

## 2022-04-06 LAB — BASIC METABOLIC PANEL
BUN/Creatinine Ratio: 34 — ABNORMAL HIGH (ref 12–28)
BUN: 21 mg/dL (ref 8–27)
CO2: 24 mmol/L (ref 20–29)
Calcium: 10.1 mg/dL (ref 8.7–10.3)
Chloride: 104 mmol/L (ref 96–106)
Creatinine, Ser: 0.61 mg/dL (ref 0.57–1.00)
Glucose: 107 mg/dL — ABNORMAL HIGH (ref 70–99)
Potassium: 6 mmol/L (ref 3.5–5.2)
Sodium: 140 mmol/L (ref 134–144)
eGFR: 97 mL/min/{1.73_m2} (ref >59)

## 2022-04-06 MED ORDER — AMLODIPINE BESYLATE 2.5 MG PO TABS
2.5000 mg | ORAL_TABLET | Freq: Every day | ORAL | 3 refills | Status: DC
Start: 2022-04-06 — End: 2022-05-10

## 2022-04-06 NOTE — Patient Instructions (Addendum)
Medication Instructions:  START Amlodipine 2.5 mg once daily  *If you need a refill on your cardiac medications before your next appointment, please call your pharmacy*   Lab Work: Your provider would like for you to have the following labs today: BMET  If you have labs (blood work) drawn today and your tests are completely normal, you will receive your results only by: [place] (if you have MyChart) OR A paper copy in the mail If you have any lab test that is abnormal or we need to change your treatment, we will call you to review the results.   Testing/Procedures: None ordered   Follow-Up: At [facility], you and your health needs are our priority.  As part of our continuing mission to provide you with exceptional heart care, we have created designated Provider Care Teams.  These Care Teams include your primary Cardiologist (physician) and Advanced Practice Providers (APPs -  Physician Assistants and Nurse Practitioners) who all work together to provide you with the care you need, when you need it.  We recommend signing up for the patient portal called "MyChart".  Sign up information is provided on this After Visit Summary.  MyChart is used to connect with patients for Virtual Visits (Telemedicine).  Patients are able to view lab/test results, encounter notes, upcoming appointments, etc.  Non-urgent messages can be sent to your provider as well.   To learn more about what you can do with MyChart, go to [email or web address].    Your next appointment:   12 month(s)  The format for your next appointment:   In Person  Provider:   Sanda Klein, MD     Other Instructions  Dr. Sallyanne Kuster would like you to check your blood pressure daily for the next 2 weeks.  Keep a journal of these daily blood pressure and heart rate readings and call our office or send a message through [place] with the results. Thank you!  It is best to check your BP 1-2 hours after taking your  medications to see the medications effectiveness on your BP.    Here are some tips that our clinical pharmacists share for home BP monitoring:          Rest 10 minutes before taking your blood pressure.          Don't smoke or drink caffeinated beverages for at least 30 minutes before.          Take your blood pressure before (not after) you eat.          Sit comfortably with your back supported and both feet on the floor (don't cross your legs).          Elevate your arm to heart level on a table or a desk.          Use the proper sized cuff. It should fit smoothly and snugly around your bare upper arm. There should be enough room to slip a fingertip under the cuff. The bottom edge of the cuff should be 1 inch above the crease of the elbow.   [employer]: Website: [email or web address]  DR. Sallyanne Kuster RECOMMENDS YOU PURCHASE  " [employer]" [employer] FROM THE  [employer]  APP PLAY STORE.  THE APP IS FREE , BUT THE  EQUIPMENT HAS A COST. IT ALLOWS YOU TO OBTAIN A RECORDING OF YOUR HEART RATE AND RHYTHM BY PROVIDING A SHORT STRIP THAT YOU CAN SHARE WITH YOUR PROVIDER.     [employer] - sending  an EKG Download app and set up profile. Run EKG - by placing 1-2 fingers on the silver plates After EKG is complete - Download PDF  - Skip password (if you apply a password the provider will need it to view the EKG) Click share button (square with upward arrow) in bottom left corner To send: choose MyChart (first time log into MyChart)  Pop up window about sending ECG Click continue Choose type of message Choose provider Type subject and message Click send (EKG should be attached)  - To send additional EKGs in one message click the paperclip image and bottom of page to attach.

## 2022-04-06 NOTE — Progress Notes (Signed)
Cardiology Consultation Note:    Date:  04/06/2022   ID:  Billee Cashing Torr, DOB Apr 20, 1951, MRN [id number]  PCP:  Binnie Rail, MD   [place]  Cardiologist:  Sanda Klein, MD NEW Advanced Practice Provider:  No care team member to display Electrophysiologist:  None       Referring MD: Binnie Rail, MD   No chief complaint on file. Crystal Barnes is a 71 y.o. female who is being seen today for the evaluation of CAD at the request of Burns, Claudina Lick, MD.   History of Present Illness:    Crystal Barnes is a 71 y.o. female with a hx of CAD and hypercholesterolemia who relocated recently from [place] to [place].  Her sister, Rande Brunt, is also my patient  Approximately 8 years ago she had angina pectoris described as a "elephant on my chest" and received a stent to the "front wall of the heart" and had another 60% blockage in a different vessel.    Last year she had a normal echocardiogram and a normal stress test (exercised for 10 minutes/13 METS on the Bruce protocol).  She did have a hypertensive response to exercise.  She has mild hypertension but has found it difficult to identify medication that works well for her.  She repeatedly developed hyperkalemia with losartan.  She developed extreme weakness with hydrochlorothiazide.  She is currently off all medications and her systolic blood pressures running in the 140s.  She is very lean and exercises regularly, walking for about an hour on her treadmill or walking her dog.  She is very lean with a BMI under 21.  She has an excellent epic profile on combination Repatha and very low-dose rosuvastatin.  We did try to stop the rosuvastatin altogether, but her LDL increased to unacceptably high levels.  Her father had bypass surgery around the age of 23, her mother lived to age 66.  She was intolerant of atorvastatin which caused severe muscle pain, but she tolerates rosuvastatin and  ezetimibe.  She has an occasional very fleeting discomfort in her anterior chest, sounds like she is describing isolated palpitations due to PACs or PVCs.  This is not exertional related.  The patient specifically denies any chest pain worsened by exertion, dyspnea at rest or with exertion, orthopnea, paroxysmal nocturnal dyspnea, syncope, focal neurological deficits, intermittent claudication, lower extremity edema, unexplained weight gain, cough, hemoptysis or wheezing.   Past Medical History:  Diagnosis Date   Arthritis    CAD (coronary artery disease)    Cataract    bilateral-removed   Hyperlipidemia    Hypertension    Hypothyroidism    Myocardial infarction [facility])    Thyroid disease     Past Surgical History:  Procedure Laterality Date   ABDOMINAL HYSTERECTOMY  2008   CATARACT EXTRACTION Bilateral    COLONOSCOPY     CORONARY STENT PLACEMENT  2014   ENDOSCOPIC MUCOSAL RESECTION N/A 11/24/2019   Procedure: ENDOSCOPIC MUCOSAL RESECTION;  Surgeon: Irving Copas., MD;  Location: [place];  Service: Gastroenterology;  Laterality: N/A;   ENDOSCOPIC MUCOSAL RESECTION  08/04/2020   Procedure: ENDOSCOPIC MUCOSAL RESECTION;  Surgeon: Rush Landmark Telford Nab., MD;  Location: [facility];  Service: Gastroenterology;;   Otho Darner SIGMOIDOSCOPY N/A 11/24/2019   Procedure: Beryle Quant;  Surgeon: Irving Copas., MD;  Location: [place];  Service: Gastroenterology;  Laterality: N/A;   FLEXIBLE SIGMOIDOSCOPY N/A 08/04/2020   Procedure: FLEXIBLE SIGMOIDOSCOPY;  Surgeon: Rush Landmark,  Telford Nab., MD;  Location: [facility];  Service: Gastroenterology;  Laterality: N/A;   HEMOSTASIS CLIP PLACEMENT  11/24/2019   Procedure: HEMOSTASIS CLIP PLACEMENT;  Surgeon: Irving Copas., MD;  Location: [place];  Service: Gastroenterology;;   HEMOSTASIS CLIP PLACEMENT  08/04/2020   Procedure: HEMOSTASIS CLIP PLACEMENT;  Surgeon: Irving Copas., MD;   Location: [facility];  Service: Gastroenterology;;   POLYPECTOMY     SUBMUCOSAL LIFTING INJECTION  11/24/2019   Procedure: SUBMUCOSAL LIFTING INJECTION;  Surgeon: Irving Copas., MD;  Location: [place];  Service: Gastroenterology;;   SUBMUCOSAL LIFTING INJECTION  08/04/2020   Procedure: SUBMUCOSAL LIFTING INJECTION;  Surgeon: Irving Copas., MD;  Location: [facility];  Service: Gastroenterology;;   TONSILLECTOMY      Current Medications: Current Meds  Medication Sig   amLODipine (NORVASC) 2.5 MG tablet Take 1 tablet (2.5 mg total) by mouth daily.   aspirin EC 81 MG tablet Take 1 tablet (81 mg total) by mouth daily.   Calcium-Magnesium-Vitamin D (CALCIUM 1200+D3 PO) Take 1 tablet by mouth daily.   Coenzyme Q10 (CO Q 10 PO) Take 1 capsule by mouth every evening.   gabapentin (NEURONTIN) 300 MG capsule TAKE 1 CAPSULE BY MOUTH 4  TIMES DAILY   levothyroxine (SYNTHROID) 88 MCG tablet Take 1 tablet (88 mcg total) by mouth daily before breakfast.   Multiple Vitamin (MULTIVITAMIN WITH MINERALS) TABS tablet Take 1 tablet by mouth daily.   Polyethyl Glycol-Propyl Glycol (SYSTANE OP) Place 1 drop into both eyes daily as needed (Dry eyes).    REPATHA SURECLICK 161 MG/ML SOAJ INJECT 140 MG INTO THE SKIN EVERY 14 DAYS   rOPINIRole (REQUIP) 0.5 MG tablet Take 1-2 tablets (0.5-1 mg total) by mouth at bedtime.   rosuvastatin (CRESTOR) 5 MG tablet Take 1 tablet (5 mg total) by mouth every other day. (Patient taking differently: Take 5 mg by mouth every other day. Patient takes 2.5 mg daily)   traMADol (ULTRAM) 50 MG tablet TAKE 2 TABLETS BY MOUTH EVERY 8 HOURS AS NEEDED   Turmeric 500 MG CAPS Take 500 mg by mouth at bedtime.     Allergies:   Patient has no known allergies.   Social History   Socioeconomic History   Marital status: Single    Spouse name: Not on file   Number of children: Not on file   Years of education: Not on file   Highest education level: Not on file   Occupational History   Occupation: Retired     Comment: about a year and half ago  Tobacco Use   Smoking status: Former    Years: 36.00    Types: Cigarettes   Smokeless tobacco: Never  Vaping Use   Vaping Use: Never used  Substance and Sexual Activity   Alcohol use: Not Currently   Drug use: Never   Sexual activity: Not Currently  Other Topics Concern   Not on file  Social History Narrative   Not on file   Social Determinants of Health   Financial Resource Strain: [place]  (07/11/2021)   Overall Financial Resource Strain (CARDIA)    Difficulty of Paying Living Expenses: Not hard at all  Food Insecurity: No [place] (07/11/2021)   Hunger Vital Sign    Worried About Running Out of Food in the Last Year: Never true    [place] in the Last Year: Never true  Transportation Needs: No Transportation Needs (07/11/2021)   [place] - Transportation  Lack of Transportation (Medical): No    Lack of Transportation (Non-Medical): No  Physical Activity: Sufficiently Active (07/11/2021)   Exercise Vital Sign    Days of Exercise per Week: 7 days    Minutes of Exercise per Session: 60 min  Stress: No Stress Concern Present (07/11/2021)   [place]    Feeling of Stress : Not at all  Social Connections: Moderately Integrated (07/11/2021)   Social Connection and Isolation Panel [NHANES]    Frequency of Communication with Friends and Family: More than three times a week    Frequency of Social Gatherings with Friends and Family: More than three times a week    Attends Religious Services: More than 4 times per year    Active Member of [employer] or Organizations: Yes    Attends Music therapist: More than 4 times per year    Marital Status: Never married     Family History: The patient's family history includes Colon polyps in her mother; Esophageal cancer in her father. There is no history of Colon  cancer, Rectal cancer, Stomach cancer, Inflammatory bowel disease, Liver disease, or Pancreatic cancer.  ROS:   Please see the history of present illness.     All other systems reviewed and are negative.  EKGs/Labs/Other Studies Reviewed:    The following studies were reviewed today: ECHO  01/04/2021  1. Left ventricular ejection fraction by 3D volume is 68 %. The left  ventricle has normal function. The left ventricle has no regional wall  motion abnormalities. Left ventricular diastolic parameters were normal.  The average left ventricular global  longitudinal strain is -22.1 %. The global longitudinal strain is normal.   2. Right ventricular systolic function is normal. The right ventricular  size is normal. There is normal pulmonary artery systolic pressure. The  estimated right ventricular systolic pressure is 57.8 mmHg.   3. The mitral valve is normal in structure. Trivial mitral valve  regurgitation. No evidence of mitral stenosis.   4. The aortic valve is tricuspid. There is mild calcification of the  aortic valve. Aortic valve regurgitation is not visualized. No aortic  stenosis is present.   5. The inferior vena cava is normal in size with greater than 50%  respiratory variability, suggesting right atrial pressure of 3 mmHg.   Conclusion(s)/Recommendation(s): Normal biventricular function without  evidence of hemodynamically significant valvular heart disease.   STRESS TEST 01/06/2021 Exercise stress test: Clinically and electrically negative for ischemia.  Excellent exercise tolerance Normal exercise stress test     EKG:  EKG is ordered today.  ECG shows normal sinus rhythm, normal tracing  Recent Labs: 08/01/2021: Hemoglobin 14.8; Platelets 293.0 03/15/2022: ALT 28; TSH 4.97 03/27/2022: BUN 22; Creatinine, Ser 0.74; Potassium 6.0 No hemolysis seen; Sodium 138  Recent Lipid Panel    Component Value Date/Time   CHOL 134 03/15/2022 1551   CHOL 122 01/30/2022 1156    TRIG 87.0 03/15/2022 1551   HDL 62.80 03/15/2022 1551   HDL 66 01/30/2022 1156   CHOLHDL 2 03/15/2022 1551   VLDL 17.4 03/15/2022 1551   LDLCALC 54 03/15/2022 1551   LDLCALC 38 01/30/2022 1156   LDLCALC 94 07/23/2020 1052      Risk Assessment/Calculations:       Physical Exam:    VS:  BP (!) 142/80 (BP Location: Left Arm, Patient Position: Sitting, Cuff Size: Normal)   Pulse 62   Ht '5\' 2"'$  (1.575 m)   Wt  113 lb (51.3 kg)   LMP  (LMP Unknown)   SpO2 97%   BMI 20.67 kg/m     Wt Readings from Last 3 Encounters:  04/06/22 113 lb (51.3 kg)  03/15/22 111 lb 9.6 oz (50.6 kg)  11/09/21 112 lb (50.8 kg)     General: Alert, oriented x3, no distress, appears very lean and fit Head: no evidence of trauma, PERRL, EOMI, no exophtalmos or lid lag, no myxedema, no xanthelasma; normal ears, nose and oropharynx Neck: normal jugular venous pulsations and no hepatojugular reflux; brisk carotid pulses without delay and no carotid bruits Chest: clear to auscultation, no signs of consolidation by percussion or palpation, normal fremitus, symmetrical and full respiratory excursions Cardiovascular: normal position and quality of the apical impulse, regular rhythm, normal first and second heart sounds, no murmurs, rubs or gallops Abdomen: no tenderness or distention, no masses by palpation, no abnormal pulsatility or arterial bruits, normal bowel sounds, no hepatosplenomegaly Extremities: no clubbing, cyanosis or edema; 2+ radial, ulnar and brachial pulses bilaterally; 2+ right femoral, posterior tibial and dorsalis pedis pulses; 2+ left femoral, posterior tibial and dorsalis pedis pulses; no subclavian or femoral bruits Neurological: grossly nonfocal Psych: Normal mood and affect   ASSESSMENT:    1. Coronary artery disease involving native coronary artery of native heart without angina pectoris   2. Hypercholesterolemia   3. Essential hypertension   4. Acquired hypothyroidism   5.  Palpitations   6. Hyperkalemia     PLAN:    In order of problems listed above:  CAD: Asymptomatic with a very active lifestyle.  All risk factors are well addressed. HLP: LDL in target range at 54.  We have been able to stop the ezetimibe, but when we tried to completely stop the rosuvastatin her LDL increased to unacceptably high levels. HTN: Intolerant of ARB and thiazide diuretic due to side effects.  Recheck potassium level today.  She has relative resting bradycardia and is unlikely to tolerate a beta-blocker.  We will try low-dose amlodipine 2.5 mg once daily.  Send [place] records of her blood pressure in a couple of weeks. Hypothyroidism: Clinically euthyroid and normal TSH recently. Palpitations: These were prominent about a month ago but have been absent for the last 3 to 4 weeks.  If they recur I suggested that she purchase a commercially available rhythm monitor such as a Kardia mobile device.     Medication Adjustments/Labs and Tests Ordered: Current medicines are reviewed at length with the patient today.  Concerns regarding medicines are outlined above.  Orders Placed This Encounter  Procedures   Basic metabolic panel   EKG 83-MOQH   Meds ordered this encounter  Medications   amLODipine (NORVASC) 2.5 MG tablet    Sig: Take 1 tablet (2.5 mg total) by mouth daily.    Dispense:  90 tablet    Refill:  3    Patient Instructions  Medication Instructions:  START Amlodipine 2.5 mg once daily  *If you need a refill on your cardiac medications before your next appointment, please call your pharmacy*   Lab Work: Your provider would like for you to have the following labs today: BMET  If you have labs (blood work) drawn today and your tests are completely normal, you will receive your results only by: [place] (if you have MyChart) OR A paper copy in the mail If you have any lab test that is abnormal or we need to change your treatment, we will call you to review  the  results.   Testing/Procedures: None ordered   Follow-Up: At [facility], you and your health needs are our priority.  As part of our continuing mission to provide you with exceptional heart care, we have created designated Provider Care Teams.  These Care Teams include your primary Cardiologist (physician) and Advanced Practice Providers (APPs -  Physician Assistants and Nurse Practitioners) who all work together to provide you with the care you need, when you need it.  We recommend signing up for the patient portal called "MyChart".  Sign up information is provided on this After Visit Summary.  MyChart is used to connect with patients for Virtual Visits (Telemedicine).  Patients are able to view lab/test results, encounter notes, upcoming appointments, etc.  Non-urgent messages can be sent to your provider as well.   To learn more about what you can do with MyChart, go to [email or web address].    Your next appointment:   12 month(s)  The format for your next appointment:   In Person  Provider:   Sanda Klein, MD     Other Instructions  Dr. Sallyanne Kuster would like you to check your blood pressure daily for the next 2 weeks.  Keep a journal of these daily blood pressure and heart rate readings and call our office or send a message through [place] with the results. Thank you!  It is best to check your BP 1-2 hours after taking your medications to see the medications effectiveness on your BP.    Here are some tips that our clinical pharmacists share for home BP monitoring:          Rest 10 minutes before taking your blood pressure.          Don't smoke or drink caffeinated beverages for at least 30 minutes before.          Take your blood pressure before (not after) you eat.          Sit comfortably with your back supported and both feet on the floor (don't cross your legs).          Elevate your arm to heart level on a table or a desk.          Use the proper sized cuff. It  should fit smoothly and snugly around your bare upper arm. There should be enough room to slip a fingertip under the cuff. The bottom edge of the cuff should be 1 inch above the crease of the elbow.   Kardia Mobile [employer]: Website: [email or web address]  DR. Sallyanne Kuster RECOMMENDS YOU PURCHASE  " Kardia" [employer] FROM THE  [employer]  APP PLAY STORE.  THE APP IS FREE , BUT THE  EQUIPMENT HAS A COST. IT ALLOWS YOU TO OBTAIN A RECORDING OF YOUR HEART RATE AND RHYTHM BY PROVIDING A SHORT STRIP THAT YOU CAN SHARE WITH YOUR PROVIDER.     [facility] - sending an EKG Download app and set up profile. Run EKG - by placing 1-2 fingers on the silver plates After EKG is complete - Download PDF  - Skip password (if you apply a password the provider will need it to view the EKG) Click share button (square with upward arrow) in bottom left corner To send: choose MyChart (first time log into MyChart)  Pop up window about sending ECG Click continue Choose type of message Choose provider Type subject and message Click send (EKG should be attached)  - To send additional EKGs in one message click the  paperclip image and bottom of page to attach.           Signed, Sanda Klein, MD  04/06/2022 8:57 AM    [facility]

## 2022-04-06 NOTE — Telephone Encounter (Signed)
Received a call from [place] calling to report patient's potassium done today 6.0.Advised I will send message to Dr.Croitoru.

## 2022-04-06 NOTE — Telephone Encounter (Signed)
Please give a dose of Lokelma tomorrow and recheck BMET next week

## 2022-04-07 ENCOUNTER — Other Ambulatory Visit: Payer: Self-pay | Admitting: Internal Medicine

## 2022-04-07 ENCOUNTER — Encounter: Payer: Self-pay | Admitting: Cardiovascular Disease

## 2022-04-07 ENCOUNTER — Other Ambulatory Visit: Payer: Self-pay | Admitting: *Deleted

## 2022-04-07 DIAGNOSIS — E875 Hyperkalemia: Secondary | ICD-10-CM

## 2022-04-07 NOTE — Telephone Encounter (Signed)
Check [place] registry last filled 03/09/2022.Marland KitchenJohny Barnes

## 2022-04-07 NOTE — Telephone Encounter (Signed)
Patient aware. Please see MyChart message

## 2022-04-10 DIAGNOSIS — E875 Hyperkalemia: Secondary | ICD-10-CM | POA: Diagnosis not present

## 2022-04-10 LAB — BASIC METABOLIC PANEL
BUN/Creatinine Ratio: 33 — ABNORMAL HIGH (ref 12–28)
BUN: 22 mg/dL (ref 8–27)
CO2: 25 mmol/L (ref 20–29)
Calcium: 9.7 mg/dL (ref 8.7–10.3)
Chloride: 105 mmol/L (ref 96–106)
Creatinine, Ser: 0.67 mg/dL (ref 0.57–1.00)
Glucose: 96 mg/dL (ref 70–99)
Potassium: 5.3 mmol/L — ABNORMAL HIGH (ref 3.5–5.2)
Sodium: 139 mmol/L (ref 134–144)
eGFR: 95 mL/min/{1.73_m2} (ref >59)

## 2022-04-18 ENCOUNTER — Other Ambulatory Visit: Payer: Self-pay

## 2022-04-18 DIAGNOSIS — E875 Hyperkalemia: Secondary | ICD-10-CM | POA: Diagnosis not present

## 2022-04-19 ENCOUNTER — Encounter: Payer: Self-pay | Admitting: Cardiovascular Disease

## 2022-04-19 DIAGNOSIS — I2510 Atherosclerotic heart disease of native coronary artery without angina pectoris: Secondary | ICD-10-CM

## 2022-04-19 DIAGNOSIS — I10 Essential (primary) hypertension: Secondary | ICD-10-CM

## 2022-04-19 DIAGNOSIS — E875 Hyperkalemia: Secondary | ICD-10-CM

## 2022-04-19 LAB — BASIC METABOLIC PANEL
BUN/Creatinine Ratio: 34 — ABNORMAL HIGH (ref 12–28)
BUN: 21 mg/dL (ref 8–27)
CO2: 24 mmol/L (ref 20–29)
Calcium: 9.7 mg/dL (ref 8.7–10.3)
Chloride: 103 mmol/L (ref 96–106)
Creatinine, Ser: 0.61 mg/dL (ref 0.57–1.00)
Glucose: 98 mg/dL (ref 70–99)
Potassium: 5.7 mmol/L — ABNORMAL HIGH (ref 3.5–5.2)
Sodium: 139 mmol/L (ref 134–144)
eGFR: 97 mL/min/{1.73_m2} (ref >59)

## 2022-04-21 DIAGNOSIS — I2510 Atherosclerotic heart disease of native coronary artery without angina pectoris: Secondary | ICD-10-CM | POA: Diagnosis not present

## 2022-04-21 DIAGNOSIS — E875 Hyperkalemia: Secondary | ICD-10-CM | POA: Diagnosis not present

## 2022-04-21 DIAGNOSIS — I10 Essential (primary) hypertension: Secondary | ICD-10-CM | POA: Diagnosis not present

## 2022-04-26 ENCOUNTER — Encounter: Payer: Self-pay | Admitting: Cardiovascular Disease

## 2022-04-26 DIAGNOSIS — E875 Hyperkalemia: Secondary | ICD-10-CM

## 2022-04-28 ENCOUNTER — Encounter: Payer: Self-pay | Admitting: Cardiovascular Disease

## 2022-05-01 LAB — BASIC METABOLIC PANEL
BUN/Creatinine Ratio: 32 — ABNORMAL HIGH (ref 12–28)
BUN: 22 mg/dL (ref 8–27)
CO2: 25 mmol/L (ref 20–29)
Calcium: 9.8 mg/dL (ref 8.7–10.3)
Chloride: 104 mmol/L (ref 96–106)
Creatinine, Ser: 0.69 mg/dL (ref 0.57–1.00)
Glucose: 104 mg/dL — ABNORMAL HIGH (ref 70–99)
Potassium: 5.4 mmol/L — ABNORMAL HIGH (ref 3.5–5.2)
Sodium: 141 mmol/L (ref 134–144)
eGFR: 94 mL/min/{1.73_m2} (ref >59)

## 2022-05-01 LAB — CORTISOL-AM, BLOOD: Cortisol - AM: 22.4 ug/dL — ABNORMAL HIGH (ref 6.2–19.4)

## 2022-05-01 LAB — SODIUM, URINE, RANDOM: Sodium, Ur: 39 mmol/L

## 2022-05-01 LAB — POTASSIUM, URINE, RANDOM: Potassium Urine: 19.2 mmol/L

## 2022-05-01 LAB — ALDOSTERONE + RENIN ACTIVITY W/ RATIO
ALDOS/RENIN RATIO: 15 (ref 0.0–30.0)
ALDOSTERONE: 8.5 ng/dL (ref 0.0–30.0)
Renin: 0.568 ng/mL/hr (ref 0.167–5.380)

## 2022-05-01 LAB — CHLORIDE, URINE, RANDOM: Chloride, Ur: 43 mmol/L

## 2022-05-01 LAB — CREATININE, URINE, RANDOM: Creatinine, Urine: 13 mg/dL

## 2022-05-06 ENCOUNTER — Encounter: Payer: Self-pay | Admitting: Internal Medicine

## 2022-05-08 ENCOUNTER — Other Ambulatory Visit: Payer: Self-pay | Admitting: Internal Medicine

## 2022-05-10 ENCOUNTER — Other Ambulatory Visit: Payer: Self-pay | Admitting: Internal Medicine

## 2022-05-10 ENCOUNTER — Encounter: Payer: Self-pay | Admitting: Internal Medicine

## 2022-05-10 ENCOUNTER — Other Ambulatory Visit: Payer: Self-pay

## 2022-05-10 MED ORDER — TRAMADOL HCL 50 MG PO TABS: ORAL_TABLET | ORAL | 0 refills | Status: DC

## 2022-05-10 NOTE — Telephone Encounter (Signed)
Check [place] registry last filled 04/10/2022.Marland KitchenJohny Barnes

## 2022-05-15 ENCOUNTER — Encounter: Payer: Self-pay | Admitting: Internal Medicine

## 2022-05-15 DIAGNOSIS — E875 Hyperkalemia: Secondary | ICD-10-CM | POA: Insufficient documentation

## 2022-05-16 ENCOUNTER — Other Ambulatory Visit (INDEPENDENT_AMBULATORY_CARE_PROVIDER_SITE_OTHER): Payer: Medicare Other

## 2022-05-16 DIAGNOSIS — E875 Hyperkalemia: Secondary | ICD-10-CM | POA: Diagnosis not present

## 2022-05-16 LAB — BASIC METABOLIC PANEL
BUN: 22 mg/dL (ref 6–23)
CO2: 26 mEq/L (ref 19–32)
Calcium: 9.3 mg/dL (ref 8.4–10.5)
Chloride: 103 mEq/L (ref 96–112)
Creatinine, Ser: 0.58 mg/dL (ref 0.40–1.20)
GFR: 92.74 mL/min (ref >60.00)
Glucose, Bld: 94 mg/dL (ref 70–99)
Potassium: 4.3 mEq/L (ref 3.5–5.1)
Sodium: 135 mEq/L (ref 135–145)

## 2022-05-17 ENCOUNTER — Encounter: Payer: Self-pay | Admitting: Cardiovascular Disease

## 2022-05-17 NOTE — Telephone Encounter (Signed)
Sent to Dr Sallyanne Kuster for review

## 2022-07-01 ENCOUNTER — Encounter: Payer: Self-pay | Admitting: Internal Medicine

## 2022-07-01 DIAGNOSIS — I10 Essential (primary) hypertension: Secondary | ICD-10-CM

## 2022-07-04 NOTE — Telephone Encounter (Signed)
Please see the MyChart message reply(ies) for my assessment and plan as well.  She reports her blood pressure has been running high and wondered about increasing the hydrochlorothiazide which I think is reasonable.  Reviewed last blood work and potassium and kidney function were in the normal range.  She advised taking the medication all at once instead of bringing up because of the side effect of increased urination.  Need to check kidney function and potassium after making the change-BMP ordered and she will come to the lab to have this done.   The patient gave consent for this Medical Advice Message and is aware that it may result in a bill to their insurance company as well as the possibility that this may result in a co-payment or deductible. They are an established patient, but are not seeking medical advice exclusively about a problem treated during an in person or video visit in the last 7 days. I did not recommend an in person or video visit within 7 days of my reply.   I spent a total of 5 minutes cumulative time within 7 days through [place], MD

## 2022-07-05 ENCOUNTER — Encounter: Payer: Self-pay | Admitting: Internal Medicine

## 2022-07-06 ENCOUNTER — Telehealth: Payer: Self-pay | Admitting: Internal Medicine

## 2022-07-06 ENCOUNTER — Encounter: Payer: Self-pay | Admitting: Internal Medicine

## 2022-07-06 NOTE — Progress Notes (Signed)
Subjective:    Patient ID: Crystal Barnes, female    DOB: March 25, 1951, 71 y.o.   MRN: [id number]     HPI Crystal Barnes is here for follow up of her chronic medical problems, including htn, rls  Blood pressure was running high and we increased her hydrochlorothiazide to 25 mg daily.  She started that 2 days ago.  Blood pressure at home has been running high-around where it is here today, but previously was 160s-170s.  She denies any new medications or supplements.  Adjusted her diet since her potassium was elevated she stopped eating high potassium foods, which she was eating a lot of.  She has also stopped being a vegan and is eating more meat.  He does feel better.  She is concerned why her blood pressure is higher now-it has not been elevated like this in the past.  Medications and allergies reviewed with patient and updated if appropriate.  Current Outpatient Medications on File Prior to Visit  Medication Sig Dispense Refill   aspirin EC 81 MG tablet Take 1 tablet (81 mg total) by mouth daily. 30 tablet    Calcium-Magnesium-Vitamin D (CALCIUM 1200+D3 PO) Take 1 tablet by mouth daily.     Coenzyme Q10 (CO Q 10 PO) Take 1 capsule by mouth every evening.     gabapentin (NEURONTIN) 300 MG capsule TAKE 1 CAPSULE BY MOUTH 4  TIMES DAILY 360 capsule 3   hydrochlorothiazide (HYDRODIURIL) 12.5 MG tablet Take 25 mg by mouth daily.     levothyroxine (SYNTHROID) 88 MCG tablet Take 1 tablet (88 mcg total) by mouth daily before breakfast. 90 tablet 3   Multiple Vitamin (MULTIVITAMIN WITH MINERALS) TABS tablet Take 1 tablet by mouth daily.     Polyethyl Glycol-Propyl Glycol (SYSTANE OP) Place 1 drop into both eyes daily as needed (Dry eyes).      REPATHA SURECLICK 539 MG/ML SOAJ INJECT 140 MG INTO THE SKIN EVERY 14 DAYS 2 mL 11   rOPINIRole (REQUIP) 0.5 MG tablet Take 1-2 tablets (0.5-1 mg total) by mouth at bedtime. 180 tablet 2   rosuvastatin (CRESTOR) 5 MG tablet Take 1 tablet (5 mg total) by  mouth every other day. (Patient taking differently: Take 5 mg by mouth every other day. Patient takes 2.5 mg daily) 45 tablet 3   traMADol (ULTRAM) 50 MG tablet TAKE 2 TABLETS BY MOUTH EVERY 8 HOURS AS NEEDED 180 tablet 0   Turmeric 500 MG CAPS Take 500 mg by mouth at bedtime.     No current facility-administered medications on file prior to visit.     Review of Systems  Constitutional:  Negative for fever.  Respiratory:  Negative for cough, shortness of breath and wheezing.   Cardiovascular:  Negative for chest pain, palpitations and leg swelling.  Neurological:  Negative for light-headedness and headaches.       Objective:   Vitals:   07/07/22 0932 07/07/22 1009  BP: (!) 149/76 138/82  Pulse:    SpO2:     BP Readings from Last 3 Encounters:  07/07/22 138/82  04/06/22 (!) 142/80  03/15/22 122/78   Wt Readings from Last 3 Encounters:  07/07/22 110 lb (49.9 kg)  04/06/22 113 lb (51.3 kg)  03/15/22 111 lb 9.6 oz (50.6 kg)   Body mass index is 20.12 kg/m.    Physical Exam Constitutional:      General: She is not in acute distress.    Appearance: Normal appearance.  HENT:  Head: Normocephalic and atraumatic.  Eyes:     Conjunctiva/sclera: Conjunctivae normal.  Cardiovascular:     Rate and Rhythm: Normal rate and regular rhythm.     Heart sounds: Normal heart sounds. No murmur heard. Pulmonary:     Effort: Pulmonary effort is normal. No respiratory distress.     Breath sounds: Normal breath sounds. No wheezing.  Musculoskeletal:     Cervical back: Neck supple.     Right lower leg: No edema.     Left lower leg: No edema.  Lymphadenopathy:     Cervical: No cervical adenopathy.  Skin:    General: Skin is warm and dry.     Findings: No rash.  Neurological:     Mental Status: She is alert. Mental status is at baseline.  Psychiatric:        Mood and Affect: Mood normal.        Behavior: Behavior normal.        Lab Results  Component Value Date   WBC 5.1  08/01/2021   HGB 14.8 08/01/2021   HCT 44.3 08/01/2021   PLT 293.0 08/01/2021   GLUCOSE 94 05/16/2022   CHOL 134 03/15/2022   TRIG 87.0 03/15/2022   HDL 62.80 03/15/2022   LDLCALC 54 03/15/2022   ALT 28 03/15/2022   AST 26 03/15/2022   NA 135 05/16/2022   K 4.3 05/16/2022   CL 103 05/16/2022   CREATININE 0.58 05/16/2022   BUN 22 05/16/2022   CO2 26 05/16/2022   TSH 4.97 03/15/2022   HGBA1C 4.8 07/23/2020   MICROALBUR 0.2 07/23/2020     Assessment & Plan:    See Problem List for Assessment and Plan of chronic medical problems.

## 2022-07-06 NOTE — Patient Instructions (Addendum)
     Blood work was ordered.  Have this done next week.    Medications changes include :  none     An ultrasound was ordered for your kidneys.     Someone from that office will call you to schedule an appointment.    Return in about 6 months (around 01/05/2023) for Physical Exam.

## 2022-07-06 NOTE — Telephone Encounter (Signed)
N/A unable to leave a message for patient to call back to schedule Medicare Annual Wellness Visit   Last AWV  07/11/22  Please schedule at anytime with [facility] if patient calls the office back.     Any questions, please call me at [phone]

## 2022-07-07 ENCOUNTER — Encounter: Payer: Self-pay | Admitting: Internal Medicine

## 2022-07-07 ENCOUNTER — Other Ambulatory Visit: Payer: Self-pay | Admitting: Internal Medicine

## 2022-07-07 ENCOUNTER — Ambulatory Visit (INDEPENDENT_AMBULATORY_CARE_PROVIDER_SITE_OTHER): Payer: Medicare Other | Admitting: Internal Medicine

## 2022-07-07 VITALS — BP 138/82 | HR 81 | Ht 62.0 in | Wt 110.0 lb

## 2022-07-07 DIAGNOSIS — G2581 Restless legs syndrome: Secondary | ICD-10-CM

## 2022-07-07 DIAGNOSIS — I10 Essential (primary) hypertension: Secondary | ICD-10-CM | POA: Diagnosis not present

## 2022-07-07 DIAGNOSIS — E039 Hypothyroidism, unspecified: Secondary | ICD-10-CM

## 2022-07-07 DIAGNOSIS — E875 Hyperkalemia: Secondary | ICD-10-CM | POA: Diagnosis not present

## 2022-07-07 DIAGNOSIS — I2510 Atherosclerotic heart disease of native coronary artery without angina pectoris: Secondary | ICD-10-CM

## 2022-07-07 DIAGNOSIS — E782 Mixed hyperlipidemia: Secondary | ICD-10-CM

## 2022-07-07 MED ORDER — TRAMADOL HCL 50 MG PO TABS: ORAL_TABLET | ORAL | 0 refills | Status: DC

## 2022-07-07 NOTE — Assessment & Plan Note (Signed)
Chronic Controlled, Stable Continue Requip 0.5-1 mg nightly

## 2022-07-07 NOTE — Assessment & Plan Note (Signed)
Chronic  Clinically euthyroid Check tsh and will titrate med dose if needed Currently taking levothyroxine 88 mcg daily 

## 2022-07-07 NOTE — Assessment & Plan Note (Signed)
Chronic Possibly related to high dietary intake-was eating a lot of soy and high potassium foods which she has stopped Also now on hydrochlorothiazide 25 mg daily CMP next week

## 2022-07-07 NOTE — Assessment & Plan Note (Signed)
Chronic Following with cardiology No symptoms consistent with angina Continue Crestor, Repatha, hydrochlorothiazide, aspirin 81 mg daily

## 2022-07-07 NOTE — Assessment & Plan Note (Signed)
Chronic Regular exercise and healthy diet encouraged Check lipid panel  Continue Crestor 5 mg every other day, Repatha 140 mg every 2 weeks

## 2022-07-07 NOTE — Assessment & Plan Note (Addendum)
Chronic Blood pressure still elevated here today, but she just increase the hydrochlorothiazide 2 days ago so we will give this another week to see if her blood pressure stabilizes Continue hydrochlorothiazide 25 mg daily If blood pressure remains elevated will consider adding low-dose amlodipine CBC, CMP, TSH, lipid next week Given change in BP will get ultrasound renal arteries

## 2022-07-09 ENCOUNTER — Encounter: Payer: Self-pay | Admitting: Internal Medicine

## 2022-07-10 NOTE — Telephone Encounter (Signed)
I gave pt the Mychart help desk number to see if that can help her with that.   FYI

## 2022-07-10 NOTE — Telephone Encounter (Signed)
Patient called help desk - they told her you would have to erase the HCT completely out of system and put it back in.  Then select the correct pharmacy.

## 2022-07-11 ENCOUNTER — Other Ambulatory Visit: Payer: Self-pay

## 2022-07-11 DIAGNOSIS — Z13820 Encounter for screening for osteoporosis: Secondary | ICD-10-CM

## 2022-07-11 MED ORDER — HYDROCHLOROTHIAZIDE 25 MG PO TABS: 25.0000 mg | ORAL_TABLET | Freq: Every day | ORAL | 3 refills | Status: DC

## 2022-07-12 NOTE — Telephone Encounter (Signed)
Lvm for patient to call back and schedule.

## 2022-07-14 ENCOUNTER — Other Ambulatory Visit (INDEPENDENT_AMBULATORY_CARE_PROVIDER_SITE_OTHER): Payer: Medicare Other

## 2022-07-14 DIAGNOSIS — G2581 Restless legs syndrome: Secondary | ICD-10-CM

## 2022-07-14 DIAGNOSIS — I10 Essential (primary) hypertension: Secondary | ICD-10-CM

## 2022-07-14 DIAGNOSIS — E875 Hyperkalemia: Secondary | ICD-10-CM

## 2022-07-14 DIAGNOSIS — E039 Hypothyroidism, unspecified: Secondary | ICD-10-CM | POA: Diagnosis not present

## 2022-07-14 DIAGNOSIS — E782 Mixed hyperlipidemia: Secondary | ICD-10-CM

## 2022-07-14 LAB — COMPREHENSIVE METABOLIC PANEL
ALT: 18 U/L (ref 0–35)
AST: 22 U/L (ref 0–37)
Albumin: 4.3 g/dL (ref 3.5–5.2)
Alkaline Phosphatase: 52 U/L (ref 39–117)
BUN: 22 mg/dL (ref 6–23)
CO2: 27 mEq/L (ref 19–32)
Calcium: 9.5 mg/dL (ref 8.4–10.5)
Chloride: 102 mEq/L (ref 96–112)
Creatinine, Ser: 0.59 mg/dL (ref 0.40–1.20)
GFR: 92.25 mL/min (ref >60.00)
Glucose, Bld: 99 mg/dL (ref 70–99)
Potassium: 5.2 mEq/L — ABNORMAL HIGH (ref 3.5–5.1)
Sodium: 135 mEq/L (ref 135–145)
Total Bilirubin: 0.3 mg/dL (ref 0.2–1.2)
Total Protein: 6.6 g/dL (ref 6.0–8.3)

## 2022-07-14 LAB — CBC WITH DIFFERENTIAL/PLATELET
Basophils Absolute: 0.1 10*3/uL (ref 0.0–0.1)
Basophils Relative: 1.1 % (ref 0.0–3.0)
Eosinophils Absolute: 0.4 10*3/uL (ref 0.0–0.7)
Eosinophils Relative: 6.9 % — ABNORMAL HIGH (ref 0.0–5.0)
HCT: 43.1 % (ref 36.0–46.0)
Hemoglobin: 14.4 g/dL (ref 12.0–15.0)
Lymphocytes Relative: 38.5 % (ref 12.0–46.0)
Lymphs Abs: 2.5 10*3/uL (ref 0.7–4.0)
MCHC: 33.3 g/dL (ref 30.0–36.0)
MCV: 90.8 fl (ref 78.0–100.0)
Monocytes Absolute: 0.6 10*3/uL (ref 0.1–1.0)
Monocytes Relative: 9 % (ref 3.0–12.0)
Neutro Abs: 2.8 10*3/uL (ref 1.4–7.7)
Neutrophils Relative %: 44.5 % (ref 43.0–77.0)
Platelets: 327 10*3/uL (ref 150.0–400.0)
RBC: 4.75 Mil/uL (ref 3.87–5.11)
RDW: 13 % (ref 11.5–15.5)
WBC: 6.4 10*3/uL (ref 4.0–10.5)

## 2022-07-14 LAB — LIPID PANEL
Cholesterol: 149 mg/dL (ref 0–200)
HDL: 57.7 mg/dL (ref >39.00)
LDL Cholesterol: 62 mg/dL (ref 0–99)
NonHDL: 90.9
Total CHOL/HDL Ratio: 3
Triglycerides: 146 mg/dL (ref 0.0–149.0)
VLDL: 29.2 mg/dL (ref 0.0–40.0)

## 2022-07-14 LAB — TSH: TSH: 4.36 u[IU]/mL (ref 0.35–5.50)

## 2022-07-15 ENCOUNTER — Encounter: Payer: Self-pay | Admitting: Internal Medicine

## 2022-07-20 ENCOUNTER — Ambulatory Visit (INDEPENDENT_AMBULATORY_CARE_PROVIDER_SITE_OTHER): Payer: Medicare Other

## 2022-07-20 DIAGNOSIS — Z0000 Encounter for general adult medical examination without abnormal findings: Secondary | ICD-10-CM

## 2022-07-20 NOTE — Progress Notes (Signed)
Virtual Visit via Telephone Note  I connected with  Crystal Barnes on 07/20/22 at 10:15 AM EDT by telephone and verified that I am speaking with the correct person using two identifiers.  Location: Patient: HOME Provider: [facility] Persons participating in the virtual visit: patient/Nurse Health Advisor   I discussed the limitations, risks, security and privacy concerns of performing an evaluation and management service by telephone and the availability of in person appointments. The patient expressed understanding and agreed to proceed.  Interactive audio and video telecommunications were attempted between this nurse and patient, however failed, due to patient having technical difficulties OR patient did not have access to video capability.  We continued and completed visit with audio only.  Some vital signs may be absent or patient reported.   Crystal Flow, LPN  Subjective:   Crystal Barnes is a 71 y.o. female who presents for Medicare Annual (Subsequent) preventive examination.  Review of Systems     Cardiac Risk Factors include: advanced age (>48mn, >>108women);dyslipidemia;hypertension     Objective:    There were no vitals filed for this visit. There is no height or weight on file to calculate BMI.     07/20/2022   10:20 AM 07/11/2021    4:20 PM 04/27/2021   10:14 AM 08/04/2020    7:41 AM 03/29/2020    3:04 PM 11/24/2019    9:46 AM  Advanced Directives  Does Patient Have a Medical Advance Directive? No Yes Yes Yes Yes Yes  Type of Advance Directive  Living will;Healthcare Power of [place] Living will  Does patient want to make changes to medical advance directive?  No - Patient declined No - Patient declined     Copy of [place] Chart?  No - copy requested No - copy requested No - copy requested No - copy  requested   Would patient like information on creating a medical advance directive? Yes (MAU/Ambulatory/Procedural Areas - Information given)         Current Medications (verified) Outpatient Encounter Medications as of 07/20/2022  Medication Sig   aspirin EC 81 MG tablet Take 1 tablet (81 mg total) by mouth daily.   Calcium-Magnesium-Vitamin D (CALCIUM 1200+D3 PO) Take 1 tablet by mouth daily.   Coenzyme Q10 (CO Q 10 PO) Take 1 capsule by mouth every evening.   gabapentin (NEURONTIN) 300 MG capsule TAKE 1 CAPSULE BY MOUTH 4  TIMES DAILY   hydrochlorothiazide (HYDRODIURIL) 12.5 MG tablet Take 25 mg by mouth daily.   hydrochlorothiazide (HYDRODIURIL) 25 MG tablet Take 1 tablet (25 mg total) by mouth daily.   levothyroxine (SYNTHROID) 88 MCG tablet Take 1 tablet (88 mcg total) by mouth daily before breakfast.   Multiple Vitamin (MULTIVITAMIN WITH MINERALS) TABS tablet Take 1 tablet by mouth daily.   Polyethyl Glycol-Propyl Glycol (SYSTANE OP) Place 1 drop into both eyes daily as needed (Dry eyes).    REPATHA SURECLICK 1161MG/ML SOAJ INJECT 140 MG INTO THE SKIN EVERY 14 DAYS   rOPINIRole (REQUIP) 0.5 MG tablet Take 1-2 tablets (0.5-1 mg total) by mouth at bedtime.   rosuvastatin (CRESTOR) 5 MG tablet Take 1 tablet (5 mg total) by mouth every other day. (Patient taking differently: Take 5 mg by mouth every other day. Patient takes 2.5 mg daily)   traMADol (ULTRAM) 50 MG tablet TAKE 2 TABLETS BY MOUTH EVERY 8 HOURS AS NEEDED  Turmeric 500 MG CAPS Take 500 mg by mouth at bedtime.   No facility-administered encounter medications on file as of 07/20/2022.    Allergies (verified) Benadryl [diphenhydramine]   History: Past Medical History:  Diagnosis Date   Arthritis    CAD (coronary artery disease)    Cataract    bilateral-removed   Hyperlipidemia    Hypertension    Hypothyroidism    Myocardial infarction [facility])    Thyroid disease    Past Surgical History:  Procedure Laterality Date    ABDOMINAL HYSTERECTOMY  2008   CATARACT EXTRACTION Bilateral    COLONOSCOPY     CORONARY STENT PLACEMENT  2014   ENDOSCOPIC MUCOSAL RESECTION N/A 11/24/2019   Procedure: ENDOSCOPIC MUCOSAL RESECTION;  Surgeon: Irving Copas., MD;  Location: [place];  Service: Gastroenterology;  Laterality: N/A;   ENDOSCOPIC MUCOSAL RESECTION  08/04/2020   Procedure: ENDOSCOPIC MUCOSAL RESECTION;  Surgeon: Rush Landmark Telford Nab., MD;  Location: [facility];  Service: Gastroenterology;;   Otho Darner SIGMOIDOSCOPY N/A 11/24/2019   Procedure: Beryle Quant;  Surgeon: Irving Copas., MD;  Location: [place];  Service: Gastroenterology;  Laterality: N/A;   FLEXIBLE SIGMOIDOSCOPY N/A 08/04/2020   Procedure: FLEXIBLE SIGMOIDOSCOPY;  Surgeon: Rush Landmark Telford Nab., MD;  Location: [facility];  Service: Gastroenterology;  Laterality: N/A;   HEMOSTASIS CLIP PLACEMENT  11/24/2019   Procedure: HEMOSTASIS CLIP PLACEMENT;  Surgeon: Irving Copas., MD;  Location: [facility] ENDOSCOPY;  Service: Gastroenterology;;   HEMOSTASIS CLIP PLACEMENT  08/04/2020   Procedure: HEMOSTASIS CLIP PLACEMENT;  Surgeon: Irving Copas., MD;  Location: [facility] ENDOSCOPY;  Service: Gastroenterology;;   POLYPECTOMY     SUBMUCOSAL LIFTING INJECTION  11/24/2019   Procedure: SUBMUCOSAL LIFTING INJECTION;  Surgeon: Irving Copas., MD;  Location: [place];  Service: Gastroenterology;;   SUBMUCOSAL LIFTING INJECTION  08/04/2020   Procedure: SUBMUCOSAL LIFTING INJECTION;  Surgeon: Irving Copas., MD;  Location: [facility];  Service: Gastroenterology;;   TONSILLECTOMY     Family History  Problem Relation Age of Onset   Colon polyps Mother    Esophageal cancer Father    Colon cancer Neg Hx    Rectal cancer Neg Hx    Stomach cancer Neg Hx    Inflammatory bowel disease Neg Hx    Liver disease Neg Hx    Pancreatic cancer Neg Hx    Social History   Socioeconomic History    Marital status: Single    Spouse name: Not on file   Number of children: Not on file   Years of education: Not on file   Highest education level: Not on file  Occupational History   Occupation: Retired     Comment: about a year and half ago  Tobacco Use   Smoking status: Former    Years: 36.00    Types: Cigarettes   Smokeless tobacco: Never  Vaping Use   Vaping Use: Never used  Substance and Sexual Activity   Alcohol use: Not Currently   Drug use: Never   Sexual activity: Not Currently  Other Topics Concern   Not on file  Social History Narrative   Not on file   Social Determinants of Health   Financial Resource Strain: Low Risk  (07/20/2022)   Overall Financial Resource Strain (CARDIA)    Difficulty of Paying Living Expenses: Not hard at all  Food Insecurity: No Food Insecurity (07/20/2022)   Hunger Vital Sign    Worried About Running Out of Food in the Last Year: Never true  Ran Out of Food in the Last Year: Never true  Transportation Needs: No Transportation Needs (07/20/2022)   PRAPARE - Hydrologist (Medical): No    Lack of Transportation (Non-Medical): No  Physical Activity: Sufficiently Active (07/11/2021)   Exercise Vital Sign    Days of Exercise per Week: 7 days    Minutes of Exercise per Session: 60 min  Stress: No Stress Concern Present (07/20/2022)   [place]    Feeling of Stress : Not at all  Social Connections: Moderately Integrated (07/20/2022)   Social Connection and Isolation Panel [NHANES]    Frequency of Communication with Friends and Family: More than three times a week    Frequency of Social Gatherings with Friends and Family: More than three times a week    Attends Religious Services: More than 4 times per year    Active Member of [employer] or Organizations: Yes    Attends Music therapist: More than 4 times per year    Marital Status:  Never married    Tobacco Counseling Counseling given: Not Answered   Clinical Intake:  Pre-visit preparation completed: Yes  Pain : No/denies pain     BMI - recorded: 20.12 (07/07/2022) Nutritional Risks: None Diabetes: No  How often do you need to have someone help you when you read instructions, pamphlets, or other written materials from your doctor or pharmacy?: 1 - Never What is the last grade level you completed in school?: HSG; PH.D  Diabetic? NO  Interpreter Needed?: No  Information entered by :: Lisette Abu, LPN.   Activities of Daily Living    07/20/2022   10:25 AM  In your present state of health, do you have any difficulty performing the following activities:  Hearing? 0  Vision? 0  Difficulty concentrating or making decisions? 0  Walking or climbing stairs? 0  Dressing or bathing? 0  Doing errands, shopping? 0  Preparing Food and eating ? N  Using the Toilet? N  In the past six months, have you accidently leaked urine? N  Do you have problems with loss of bowel control? N  Managing your Medications? N  Managing your Finances? N  Housekeeping or managing your Housekeeping? N    Patient Care Team: Binnie Rail, MD as PCP - General (Internal Medicine) Sanda Klein, MD as PCP - Cardiology (Cardiology) Calvert Cantor, MD as Consulting Physician (Ophthalmology)  Indicate any recent Medical Services you may have received from other than [facility] providers in the past year (date may be approximate).     Assessment:   This is a routine wellness examination for Crystal Barnes.  Hearing/Vision screen Hearing Screening - Comments:: Denies hearing difficulties.  Vision Screening - Comments:: None needed - up to date with routine eye exams with [facility]   Dietary issues and exercise activities discussed: Current Exercise Habits: Home exercise routine, Type of exercise: walking;treadmill;Other - see comments (swimming), Time (Minutes): 60, Frequency  (Times/Week): 7, Weekly Exercise (Minutes/Week): 420, Exercise limited by: orthopedic condition(s)   Goals Addressed             This Visit's Progress    My goal is to continue to stay healthy, independent and physically active        Depression Screen    07/20/2022   10:25 AM 03/15/2022    3:04 PM 07/11/2021    4:22 PM 12/23/2020   11:37 AM 03/29/2020    2:59 PM 06/23/2019  3:21 PM 02/10/2019    4:06 PM  PHQ 2/9 Scores  PHQ - 2 Score 0 0 0 0 0 0 0  PHQ- 9 Score  0  2   2    Fall Risk    07/20/2022   10:21 AM 03/15/2022    3:03 PM 07/11/2021    4:22 PM 12/23/2020   11:36 AM 07/28/2020    2:03 PM  [place] in the past year? 0 0 0 0 0  Number falls in past yr: 0 0 0    Injury with Fall? 0 0 0    Risk for fall due to : No Fall Risks No Fall Risks No Fall Risks    Follow up Falls prevention discussed Falls evaluation completed Falls evaluation completed      [place]:  Any stairs in or around the home? No  If so, are there any without handrails? No  Home free of loose throw rugs in walkways, pet beds, electrical cords, etc? Yes  Adequate lighting in your home to reduce risk of falls? Yes   ASSISTIVE DEVICES UTILIZED TO PREVENT FALLS:  Life alert? No  Use of a cane, walker or w/c? No  Grab bars in the bathroom? Yes  Shower chair or bench in shower? No  Elevated toilet seat or a handicapped toilet? No   TIMED UP AND GO:  Was the test performed? No . PHONE VISIT   Cognitive Function:        07/20/2022   10:31 AM 03/29/2020    3:12 PM  6CIT Screen  What Year? 0 points 0 points  What month? 0 points 0 points  What time? 0 points 0 points  Count back from 20 0 points 0 points  Months in reverse 0 points 0 points  Repeat phrase 0 points 0 points  Total Score 0 points 0 points    Immunizations Immunization History  Administered Date(s) Administered   Fluad Quad(high Dose 65+) 07/13/2021, 07/06/2022   Influenza,  High Dose Seasonal PF 06/21/2019, 07/27/2020   Influenza-Unspecified 08/08/2018, 06/21/2019   Moderna Sars-Covid-2 Vaccination 11/11/2019, 12/02/2019, 08/07/2020   Pneumococcal Conjugate-13 08/08/2018   Pneumococcal Polysaccharide-23 06/25/2019   Tdap 07/28/2020    TDAP status: Up to date  Flu Vaccine status: Up to date  Pneumococcal vaccine status: Up to date  Covid-19 vaccine status: Completed vaccines  Qualifies for Shingles Vaccine? Yes   Zostavax completed No   Shingrix Completed?: Yes  Screening Tests Health Maintenance  Topic Date Due   COVID-19 Vaccine (4 - Moderna series) 10/02/2020   DEXA SCAN  07/23/2022   MAMMOGRAM  11/11/2023   COLONOSCOPY (Pts 45-98yr Insurance coverage will need to be confirmed)  11/09/2026   TETANUS/TDAP  07/28/2030   Pneumonia Vaccine 69 Years old  Completed   INFLUENZA VACCINE  Completed   Hepatitis C Screening  Completed   HPV VACCINES  Aged Out   Zoster Vaccines- Shingrix  Discontinued    Health Maintenance  Health Maintenance Due  Topic Date Due   COVID-19 Vaccine (4 - Moderna series) 10/02/2020    Colorectal cancer screening: Type of screening: Colonoscopy. Completed 11/09/2021. Repeat every 5 years  Mammogram status: Completed 11/10/2021. Repeat every year  Bone Density status: Completed 07/24/2019. Results reflect: Bone density results: OSTEOPENIA. Repeat every 3 years. (Scheduled for 07/24/2022)  Lung Cancer Screening: (Low Dose CT Chest recommended if Age 69-80years, 30 pack-year currently smoking OR have quit w/in 15years.)  does not qualify.   Lung Cancer Screening Referral: no  Additional Screening:  Hepatitis C Screening: does qualify; Completed 07/14/2019  Vision Screening: Recommended annual ophthalmology exams for early detection of glaucoma and other disorders of the eye. Is the patient up to date with their annual eye exam?  Yes  Who is the provider or what is the name of the office in which the patient attends  annual eye exams? [facility] If pt is not established with a provider, would they like to be referred to a provider to establish care? No .   Dental Screening: Recommended annual dental exams for proper oral hygiene  Community Resource Referral / Chronic Care Management: CRR required this visit?  No   CCM required this visit?  No      Plan:     I have personally reviewed and noted the following in the patient's chart:   Medical and social history Use of alcohol, tobacco or illicit drugs  Current medications and supplements including opioid prescriptions. Patient is not currently taking opioid prescriptions.  Functional ability and status Nutritional status Physical activity Advanced directives List of other physicians Hospitalizations, surgeries, and ER visits in previous 12 months Vitals Screenings to include cognitive, depression, and falls Referrals and appointments  In addition, I have reviewed and discussed with patient certain preventive protocols, quality metrics, and best practice recommendations. A written personalized care plan for preventive services as well as general preventive health recommendations were provided to patient.     Crystal Flow, LPN   [id number]   Nurse Notes: N/A

## 2022-07-20 NOTE — Patient Instructions (Signed)
Crystal Barnes , Thank you for taking time to come for your Medicare Wellness Visit. I appreciate your ongoing commitment to your health goals. Please review the following plan we discussed and let me know if I can assist you in the future.   These are the goals we discussed:  Goals      My goal is to continue to stay healthy, independent and physically active        This is a list of the screening recommended for you and due dates:  Health Maintenance  Topic Date Due   COVID-19 Vaccine (4 - Moderna series) 10/02/2020   DEXA scan (bone density measurement)  07/23/2022   Mammogram  11/11/2023   Colon Cancer Screening  11/09/2026   Tetanus Vaccine  07/28/2030   Pneumonia Vaccine  Completed   Flu Shot  Completed   Hepatitis C Screening: USPSTF Recommendation to screen - Ages 18-79 yo.  Completed   HPV Vaccine  Aged Out   Zoster (Shingles) Vaccine  Discontinued    Advanced directives: Advance directive discussed with you today. I have provided a copy for you to complete at home and have notarized. Once this is complete please bring a copy in to our office so we can scan it into your chart.  Conditions/risks identified: Yes  Next appointment: Follow up in one year for your annual wellness visit 07/23/2023 at 10:45 a.m. telephone visit with Mignon Pine, Nurse Health Advisor.  If you need to reschedule or cancel, please call [phone].   Preventive Care 69 Years and Older, Female Preventive care refers to lifestyle choices and visits with your health care provider that can promote health and wellness. What does preventive care include? A yearly physical exam. This is also called an annual well check. Dental exams once or twice a year. Routine eye exams. Ask your health care provider how often you should have your eyes checked. Personal lifestyle choices, including: Daily care of your teeth and gums. Regular physical activity. Eating a healthy diet. Avoiding tobacco and drug  use. Limiting alcohol use. Practicing safe sex. Taking low-dose aspirin every day. Taking vitamin and mineral supplements as recommended by your health care provider. What happens during an annual well check? The services and screenings done by your health care provider during your annual well check will depend on your age, overall health, lifestyle risk factors, and family history of disease. Counseling  Your health care provider may ask you questions about your: Alcohol use. Tobacco use. Drug use. Emotional well-being. Home and relationship well-being. Sexual activity. Eating habits. History of falls. Memory and ability to understand (cognition). Work and work Statistician. Reproductive health. Screening  You may have the following tests or measurements: Height, weight, and BMI. Blood pressure. Lipid and cholesterol levels. These may be checked every 5 years, or more frequently if you are over 70 years old. Skin check. Lung cancer screening. You may have this screening every year starting at age 69 if you have a 30-pack-year history of smoking and currently smoke or have quit within the past 15 years. Fecal occult blood test (FOBT) of the stool. You may have this test every year starting at age 21. Flexible sigmoidoscopy or colonoscopy. You may have a sigmoidoscopy every 5 years or a colonoscopy every 10 years starting at age 69 Hepatitis C blood test. Hepatitis B blood test. Sexually transmitted disease (STD) testing. Diabetes screening. This is done by checking your blood sugar (glucose) after you have not eaten for a while (fasting). You  may have this done every 1-3 years. Bone density scan. This is done to screen for osteoporosis. You may have this done starting at age 69 Mammogram. This may be done every 69 years Talk to your health care provider about how often you should have regular mammograms. Talk with your health care provider about your test results, treatment  options, and if necessary, the need for more tests. Vaccines  Your health care provider may recommend certain vaccines, such as: Influenza vaccine. This is recommended every year. Tetanus, diphtheria, and acellular pertussis (Tdap, Td) vaccine. You may need a Td booster every 10 years. Zoster vaccine. You may need this after age 28. Pneumococcal 13-valent conjugate (PCV13) vaccine. One dose is recommended after age 33. Pneumococcal polysaccharide (PPSV23) vaccine. One dose is recommended after age 28. Talk to your health care provider about which screenings and vaccines you need and how often you need them. This information is not intended to replace advice given to you by your health care provider. Make sure you discuss any questions you have with your health care provider. Document Released: 10/22/2015 Document Revised: 06/14/2016 Document Reviewed: 07/27/2015 Elsevier Interactive Patient Education  2017 [employer] Prevention in the Home Falls can cause injuries. They can happen to people of all ages. There are many things you can do to make your home safe and to help prevent falls. What can I do on the outside of my home? Regularly fix the edges of walkways and driveways and fix any cracks. Remove anything that might make you trip as you walk through a door, such as a raised step or threshold. Trim any bushes or trees on the path to your home. Use bright outdoor lighting. Clear any walking paths of anything that might make someone trip, such as rocks or tools. Regularly check to see if handrails are loose or broken. Make sure that both sides of any steps have handrails. Any raised decks and porches should have guardrails on the edges. Have any leaves, snow, or ice cleared regularly. Use sand or salt on walking paths during winter. Clean up any spills in your garage right away. This includes oil or grease spills. What can I do in the bathroom? Use night lights. Install grab  bars by the toilet and in the tub and shower. Do not use towel bars as grab bars. Use non-skid mats or decals in the tub or shower. If you need to sit down in the shower, use a plastic, non-slip stool. Keep the floor dry. Clean up any water that spills on the floor as soon as it happens. Remove soap buildup in the tub or shower regularly. Attach bath mats securely with double-sided non-slip rug tape. Do not have throw rugs and other things on the floor that can make you trip. What can I do in the bedroom? Use night lights. Make sure that you have a light by your bed that is easy to reach. Do not use any sheets or blankets that are too big for your bed. They should not hang down onto the floor. Have a firm chair that has side arms. You can use this for support while you get dressed. Do not have throw rugs and other things on the floor that can make you trip. What can I do in the kitchen? Clean up any spills right away. Avoid walking on wet floors. Keep items that you use a lot in easy-to-reach places. If you need to reach something above you, use a strong  step stool that has a grab bar. Keep electrical cords out of the way. Do not use floor polish or wax that makes floors slippery. If you must use wax, use non-skid floor wax. Do not have throw rugs and other things on the floor that can make you trip. What can I do with my stairs? Do not leave any items on the stairs. Make sure that there are handrails on both sides of the stairs and use them. Fix handrails that are broken or loose. Make sure that handrails are as long as the stairways. Check any carpeting to make sure that it is firmly attached to the stairs. Fix any carpet that is loose or worn. Avoid having throw rugs at the top or bottom of the stairs. If you do have throw rugs, attach them to the floor with carpet tape. Make sure that you have a light switch at the top of the stairs and the bottom of the stairs. If you do not have them,  ask someone to add them for you. What else can I do to help prevent falls? Wear shoes that: Do not have high heels. Have rubber bottoms. Are comfortable and fit you well. Are closed at the toe. Do not wear sandals. If you use a stepladder: Make sure that it is fully opened. Do not climb a closed stepladder. Make sure that both sides of the stepladder are locked into place. Ask someone to hold it for you, if possible. Clearly mark and make sure that you can see: Any grab bars or handrails. First and last steps. Where the edge of each step is. Use tools that help you move around (mobility aids) if they are needed. These include: Canes. Walkers. Scooters. Crutches. Turn on the lights when you go into a dark area. Replace any light bulbs as soon as they burn out. Set up your furniture so you have a clear path. Avoid moving your furniture around. If any of your floors are uneven, fix them. If there are any pets around you, be aware of where they are. Review your medicines with your doctor. Some medicines can make you feel dizzy. This can increase your chance of falling. Ask your doctor what other things that you can do to help prevent falls. This information is not intended to replace advice given to you by your health care provider. Make sure you discuss any questions you have with your health care provider. Document Released: 07/22/2009 Document Revised: 03/02/2016 Document Reviewed: 10/30/2014 Elsevier Interactive Patient Education  2017 [employer].

## 2022-07-24 ENCOUNTER — Ambulatory Visit (INDEPENDENT_AMBULATORY_CARE_PROVIDER_SITE_OTHER)
Admission: RE | Admit: 2022-07-24 | Discharge: 2022-07-24 | Disposition: A | Discharge status: Home / Self Care | Payer: Medicare Other | Source: Ambulatory Visit | Attending: Internal Medicine | Admitting: Internal Medicine

## 2022-07-24 DIAGNOSIS — Z13820 Encounter for screening for osteoporosis: Secondary | ICD-10-CM

## 2022-07-31 ENCOUNTER — Encounter: Payer: Self-pay | Admitting: *Deleted

## 2022-08-07 ENCOUNTER — Other Ambulatory Visit: Payer: Self-pay | Admitting: Internal Medicine

## 2022-08-08 MED ORDER — TRAMADOL HCL 50 MG PO TABS: ORAL_TABLET | ORAL | 0 refills | Status: DC

## 2022-09-07 ENCOUNTER — Other Ambulatory Visit: Payer: Self-pay | Admitting: Internal Medicine

## 2022-09-07 ENCOUNTER — Telehealth (HOSPITAL_BASED_OUTPATIENT_CLINIC_OR_DEPARTMENT_OTHER): Payer: Self-pay | Admitting: *Deleted

## 2022-09-07 MED ORDER — TRAMADOL HCL 50 MG PO TABS: ORAL_TABLET | ORAL | 0 refills | Status: DC

## 2022-09-07 NOTE — Telephone Encounter (Signed)
We have attempted to contact the ordering providers office to obtain a prior authorization for the ordered test.  However, we have been unsuccesful. Order will be removed from the active order WQ. Once the prior authorization is obtained we will reinstate the order and schedule patient.   Thank you

## 2022-09-08 ENCOUNTER — Encounter: Payer: Self-pay | Admitting: Internal Medicine

## 2022-09-08 MED ORDER — ROPINIROLE HCL 1 MG PO TABS: 1.0000 mg | ORAL_TABLET | Freq: Every day | ORAL | 2 refills | Status: DC

## 2022-09-10 ENCOUNTER — Other Ambulatory Visit: Payer: Self-pay | Admitting: Internal Medicine

## 2022-09-18 ENCOUNTER — Encounter: Payer: Self-pay | Admitting: Internal Medicine

## 2022-09-25 NOTE — Progress Notes (Signed)
Subjective:    Patient ID: Crystal Barnes, female    DOB: 10/23/1950, 71 y.o.   MRN: [id number]      HPI Crystal Barnes is here for  Chief Complaint  Patient presents with   Cerumen Impaction    Ear wax removal, Want her potassium checked as well     Went for a hearing evaluation - was told she has a lot of wax in her ears and a hearing evaluation could not be done.  She denies any ear symptoms-no pain, no clogged sensation and no acute change in hearing.   She is taking her medication daily as prescribed.  She wanted to have her potassium checked because that does concern her.    Medications and allergies reviewed with patient and updated if appropriate.  Current Outpatient Medications on File Prior to Visit  Medication Sig Dispense Refill   aspirin EC 81 MG tablet Take 1 tablet (81 mg total) by mouth daily. 30 tablet    Calcium-Magnesium-Vitamin D (CALCIUM 1200+D3 PO) Take 1 tablet by mouth daily.     Coenzyme Q10 (CO Q 10 PO) Take 1 capsule by mouth every evening.     gabapentin (NEURONTIN) 300 MG capsule TAKE 1 CAPSULE BY MOUTH 4  TIMES DAILY 360 capsule 3   hydrochlorothiazide (HYDRODIURIL) 25 MG tablet Take 1 tablet (25 mg total) by mouth daily. 90 tablet 3   levothyroxine (SYNTHROID) 88 MCG tablet Take 1 tablet (88 mcg total) by mouth daily before breakfast. 90 tablet 3   Multiple Vitamin (MULTIVITAMIN WITH MINERALS) TABS tablet Take 1 tablet by mouth daily.     Polyethyl Glycol-Propyl Glycol (SYSTANE OP) Place 1 drop into both eyes daily as needed (Dry eyes).      REPATHA SURECLICK 140 MG/ML SOAJ INJECT 140 MG INTO THE SKIN EVERY 14 DAYS 2 mL 11   rOPINIRole (REQUIP) 1 MG tablet Take 1-2 tablets (1-2 mg total) by mouth at bedtime. 180 tablet 2   rosuvastatin (CRESTOR) 5 MG tablet Take 1 tablet (5 mg total) by mouth every other day. (Patient taking differently: Take 5 mg by mouth every other day. Patient takes 2.5 mg daily) 45 tablet 3   traMADol (ULTRAM) 50 MG tablet TAKE  TWO TABLETS BY MOUTH EVERY 8 HOURS AS NEEDED 180 tablet 1   Turmeric 500 MG CAPS Take 500 mg by mouth at bedtime.     No current facility-administered medications on file prior to visit.    Review of Systems  Constitutional:  Negative for fever.  HENT:  Negative for congestion, ear pain, hearing loss and sinus pain.   Neurological:  Negative for dizziness and headaches.       Objective:   Vitals:   09/26/22 1433  BP: 134/78  Pulse: 80  Temp: 98.5 F (36.9 C)  SpO2: 99%   BP Readings from Last 3 Encounters:  09/26/22 134/78  07/07/22 138/82  04/06/22 (!) 142/80   Wt Readings from Last 3 Encounters:  09/26/22 110 lb (49.9 kg)  07/07/22 110 lb (49.9 kg)  04/06/22 113 lb (51.3 kg)   Body mass index is 20.12 kg/m.    Physical Exam Constitutional:      General: She is not in acute distress.    Appearance: Normal appearance. She is not ill-appearing.  HENT:     Head: Normocephalic and atraumatic.     Right Ear: Tympanic membrane normal.     Left Ear: Tympanic membrane normal.     Ears:  Comments: Right ear canal with mild cerumen, left ear canal with scant cerumen Neurological:     Mental Status: She is alert.            Assessment & Plan:    See Problem List for Assessment and Plan of chronic medical problems.

## 2022-09-26 ENCOUNTER — Encounter: Payer: Self-pay | Admitting: Internal Medicine

## 2022-09-26 ENCOUNTER — Ambulatory Visit (INDEPENDENT_AMBULATORY_CARE_PROVIDER_SITE_OTHER): Payer: Medicare Other | Admitting: Internal Medicine

## 2022-09-26 VITALS — BP 134/78 | HR 80 | Temp 98.5°F | Ht 62.0 in | Wt 110.0 lb

## 2022-09-26 DIAGNOSIS — I10 Essential (primary) hypertension: Secondary | ICD-10-CM | POA: Diagnosis not present

## 2022-09-26 DIAGNOSIS — E875 Hyperkalemia: Secondary | ICD-10-CM

## 2022-09-26 DIAGNOSIS — H6121 Impacted cerumen, right ear: Secondary | ICD-10-CM

## 2022-09-26 LAB — BASIC METABOLIC PANEL
BUN: 25 mg/dL — ABNORMAL HIGH (ref 6–23)
CO2: 28 mEq/L (ref 19–32)
Calcium: 9.8 mg/dL (ref 8.4–10.5)
Chloride: 104 mEq/L (ref 96–112)
Creatinine, Ser: 0.61 mg/dL (ref 0.40–1.20)
GFR: 91.39 mL/min (ref >60.00)
Glucose, Bld: 97 mg/dL (ref 70–99)
Potassium: 4.3 mEq/L (ref 3.5–5.1)
Sodium: 137 mEq/L (ref 135–145)

## 2022-09-26 NOTE — Assessment & Plan Note (Signed)
Chronic Initially thought to be dietary related, but revising her diet did not help We did start her on hydrochlorothiazide 25 mg daily to help compensate Blood pressure well-controlled BMP today

## 2022-09-26 NOTE — Patient Instructions (Signed)
      Blood work was ordered.   The lab is on the first floor.    Medications changes include :   none

## 2022-09-26 NOTE — Assessment & Plan Note (Signed)
Chronic Blood pressure is well-controlled Continue hydrochlorothiazide 25 mg daily BMP

## 2022-10-10 ENCOUNTER — Encounter: Payer: Self-pay | Admitting: Internal Medicine

## 2022-10-23 ENCOUNTER — Other Ambulatory Visit: Payer: Self-pay | Admitting: Cardiovascular Disease

## 2022-10-24 DIAGNOSIS — Z833 Family history of diabetes mellitus: Secondary | ICD-10-CM | POA: Diagnosis not present

## 2022-10-24 DIAGNOSIS — Z87891 Personal history of nicotine dependence: Secondary | ICD-10-CM | POA: Diagnosis not present

## 2022-10-24 DIAGNOSIS — E039 Hypothyroidism, unspecified: Secondary | ICD-10-CM | POA: Diagnosis not present

## 2022-10-24 DIAGNOSIS — M792 Neuralgia and neuritis, unspecified: Secondary | ICD-10-CM | POA: Diagnosis not present

## 2022-10-24 DIAGNOSIS — E785 Hyperlipidemia, unspecified: Secondary | ICD-10-CM | POA: Diagnosis not present

## 2022-10-24 DIAGNOSIS — I10 Essential (primary) hypertension: Secondary | ICD-10-CM | POA: Diagnosis not present

## 2022-10-24 DIAGNOSIS — R32 Unspecified urinary incontinence: Secondary | ICD-10-CM | POA: Diagnosis not present

## 2022-10-24 DIAGNOSIS — Z823 Family history of stroke: Secondary | ICD-10-CM | POA: Diagnosis not present

## 2022-10-24 DIAGNOSIS — G2581 Restless legs syndrome: Secondary | ICD-10-CM | POA: Diagnosis not present

## 2022-10-24 DIAGNOSIS — Z809 Family history of malignant neoplasm, unspecified: Secondary | ICD-10-CM | POA: Diagnosis not present

## 2022-10-24 DIAGNOSIS — I739 Peripheral vascular disease, unspecified: Secondary | ICD-10-CM | POA: Diagnosis not present

## 2022-10-24 DIAGNOSIS — Z8249 Family history of ischemic heart disease and other diseases of the circulatory system: Secondary | ICD-10-CM | POA: Diagnosis not present

## 2022-10-24 DIAGNOSIS — Z008 Encounter for other general examination: Secondary | ICD-10-CM | POA: Diagnosis not present

## 2022-11-02 ENCOUNTER — Encounter: Payer: Self-pay | Admitting: Cardiovascular Disease

## 2022-11-02 ENCOUNTER — Telehealth: Payer: Self-pay | Admitting: Cardiovascular Disease

## 2022-11-02 ENCOUNTER — Other Ambulatory Visit (HOSPITAL_COMMUNITY): Payer: Self-pay

## 2022-11-02 NOTE — Telephone Encounter (Signed)
Forward to [facility] -pharmacy - lipid

## 2022-11-02 NOTE — Telephone Encounter (Signed)
Pt c/o medication issue:  1. Name of Medication:   REPATHA SURECLICK 887 MG/ML SOAJ     2. How are you currently taking this medication (dosage and times per day)?   3. Are you having a reaction (difficulty breathing--STAT)?   4. What is your medication issue? Pt states her insurance [employer] is requiring prior authorization for this medication

## 2022-11-02 NOTE — Telephone Encounter (Signed)
Pharmacy Patient Advocate Encounter  Prior Authorization for REPATHA 140 MG/ML INJ has been approved.    Effective dates: 10/09/22 through 10/09/23   Received notification from [facility] that prior authorization for REPATHA 140 MG/ML INJ is needed.    PA submitted on 11/02/22 Key [id number] Status is pending  Karie Soda, [facility] Patient Advocate Specialist Direct Number: [phone] Fax: [phone]

## 2022-11-03 ENCOUNTER — Encounter: Payer: Self-pay | Admitting: Internal Medicine

## 2022-11-03 ENCOUNTER — Ambulatory Visit (INDEPENDENT_AMBULATORY_CARE_PROVIDER_SITE_OTHER): Payer: Medicare HMO | Admitting: Family Medicine

## 2022-11-03 ENCOUNTER — Encounter: Payer: Self-pay | Admitting: Family Medicine

## 2022-11-03 VITALS — BP 138/70 | HR 100 | Temp 97.7°F | Ht 62.0 in | Wt 108.0 lb

## 2022-11-03 DIAGNOSIS — R062 Wheezing: Secondary | ICD-10-CM

## 2022-11-03 DIAGNOSIS — R432 Parageusia: Secondary | ICD-10-CM

## 2022-11-03 DIAGNOSIS — R6883 Chills (without fever): Secondary | ICD-10-CM | POA: Diagnosis not present

## 2022-11-03 DIAGNOSIS — R051 Acute cough: Secondary | ICD-10-CM

## 2022-11-03 DIAGNOSIS — J029 Acute pharyngitis, unspecified: Secondary | ICD-10-CM

## 2022-11-03 DIAGNOSIS — R52 Pain, unspecified: Secondary | ICD-10-CM

## 2022-11-03 LAB — POCT INFLUENZA A/B
Influenza A, POC: NEGATIVE
Influenza B, POC: NEGATIVE

## 2022-11-03 LAB — POC COVID19 BINAXNOW: SARS Coronavirus 2 Ag: NEGATIVE

## 2022-11-03 MED ORDER — ALBUTEROL SULFATE HFA 108 (90 BASE) MCG/ACT IN AERS: 2.0000 | INHALATION_SPRAY | Freq: Four times a day (QID) | RESPIRATORY_TRACT | 0 refills | Status: DC | PRN

## 2022-11-03 NOTE — Progress Notes (Signed)
Subjective:  Crystal Barnes is a 72 y.o. female who presents for a 2-3 day hx of chills, body aches, nasal congestion, ST, loss of taste and cough.   Denies dizziness, chest pain, palpitations, shortness of breath, N/V/D   No other aggravating or relieving factors.  No other c/o.  ROS as in subjective.   Objective: Vitals:   11/03/22 1316  BP: 138/70  Pulse: 100  Temp: 97.7 F (36.5 C)  SpO2: 95%    General appearance: Alert, WD/WN, no distress, mildly ill appearing                             Skin: warm, no rash                           Head: no sinus tenderness                            Eyes: conjunctiva normal, corneas clear, PERRLA                                        Mouth/throat: MMM, tongue normal, mild pharyngeal erythema                           Neck: supple, no adenopathy, no thyromegaly, nontender                          Heart: RRR                         Lungs: exp wheezes in lower lobes, no rales, or rhonchi      Assessment: Body aches - Plan: POC COVID-19, POCT Influenza A/B  Chills - Plan: POC COVID-19, POCT Influenza A/B  Acute pharyngitis, unspecified etiology - Plan: POC COVID-19, POCT Influenza A/B  Loss of taste - Plan: POC COVID-19, POCT Influenza A/B  Acute cough - Plan: POC COVID-19, POCT Influenza A/B, albuterol (VENTOLIN HFA) 108 (90 Base) MCG/ACT inhaler  Wheezing - Plan: albuterol (VENTOLIN HFA) 108 (90 Base) MCG/ACT inhaler   Plan: Negative for COVID and flu today. Discussed that she has a viral illness.  Albuterol inhaler prescribed for wheezing and chest congestion.  Discussed to use this short-term while sick and then she should not need it afterwards. Suggested symptomatic OTC remedies. Nasal saline spray for congestion.  Tylenol or Ibuprofen OTC for fever and malaise.  Call/return if worsening or not significantly improving in the next 5 to 7 days.

## 2022-11-03 NOTE — Patient Instructions (Signed)
You are negative for COVID and flu today.  However, your symptoms are suspicious for COVID.  You may want to take a home test tomorrow and do a telehealth visit if it is positive.  In the meantime, treat your symptoms.  Tylenol 1000 mg every 8 hours.  You may also take ibuprofen 600 mg every 8 hours and you can alternate.  I will prescribe an albuterol inhaler to use for chest congestion, wheezing and cough.  You should not need this long-term.  Take Mucinex over-the-counter.  Drink plenty of water.  Rest.  Salt water gargles for your sore throat.  Nasal saline spray and/or Flonase as needed for nasal congestion.

## 2022-11-26 ENCOUNTER — Encounter: Payer: Self-pay | Admitting: Internal Medicine

## 2022-11-26 DIAGNOSIS — I739 Peripheral vascular disease, unspecified: Secondary | ICD-10-CM

## 2022-12-01 ENCOUNTER — Other Ambulatory Visit: Payer: Self-pay | Admitting: *Deleted

## 2022-12-01 DIAGNOSIS — R6889 Other general symptoms and signs: Secondary | ICD-10-CM

## 2022-12-03 NOTE — Progress Notes (Deleted)
[facility] [place]  ASSESSMENT / PLAN: 72 y.o. female with *** - ***  CHIEF COMPLAINT: ***  HISTORY OF PRESENT ILLNESS: Crystal Barnes is a 72 y.o. female ***  VASCULAR SURGICAL HISTORY: ***  VASCULAR RISK FACTORS: {FINDINGS; POSITIVE NEGATIVE:[id number]} history of stroke / transient ischemic attack. {FINDINGS; POSITIVE NEGATIVE:[id number]} history of coronary artery disease. *** history of PCI. *** history of CABG.  {FINDINGS; POSITIVE NEGATIVE:[id number]} history of diabetes mellitus. Last A1c ***. {FINDINGS; POSITIVE NEGATIVE:[id number]} history of smoking. *** actively smoking. {FINDINGS; POSITIVE NEGATIVE:[id number]} history of hypertension. *** drug regimen with *** control. {FINDINGS; POSITIVE NEGATIVE:[phone]} history of chronic kidney disease.  Last GFR ***. CKD {stage:30421363}. {FINDINGS; POSITIVE NEGATIVE:[phone]} history of chronic obstructive pulmonary disease, treated with ***.  FUNCTIONAL STATUS: ECOG performance status: {findings; ecog performance status:31780} Ambulatory status: {TNHAmbulation:25868}  CAREY 1 AND 3 YEAR INDEX Female (2pts) 75-79 or 80-84 (2pts) >84 (3pts) Dependence in toileting (1pt) Partial or full dependence in dressing (1pt) History of malignant neoplasm (2pts) CHF (3pts) COPD (1pts) CKD (3pts)  0-3 pts 6% 1 year mortality ; 21% 3 year mortality 4-5 pts 12% 1 year mortality ; 36% 3 year mortality >5 pts 21% 1 year mortality; 54% 3 year mortality   Past Medical History:  Diagnosis Date   Arthritis    CAD (coronary artery disease)    Cataract    bilateral-removed   Hyperlipidemia    Hypertension    Hypothyroidism    Myocardial infarction [facility])    Thyroid disease     Past Surgical History:  Procedure Laterality Date   ABDOMINAL HYSTERECTOMY  2008   CATARACT EXTRACTION Bilateral    COLONOSCOPY     CORONARY STENT PLACEMENT  2014   ENDOSCOPIC MUCOSAL RESECTION N/A 11/24/2019   Procedure:  ENDOSCOPIC MUCOSAL RESECTION;  Surgeon: Irving Copas., MD;  Location: [facility] ENDOSCOPY;  Service: Gastroenterology;  Laterality: N/A;   ENDOSCOPIC MUCOSAL RESECTION  08/04/2020   Procedure: ENDOSCOPIC MUCOSAL RESECTION;  Surgeon: Rush Landmark Telford Nab., MD;  Location: [facility] ENDOSCOPY;  Service: Gastroenterology;;   Otho Darner SIGMOIDOSCOPY N/A 11/24/2019   Procedure: Beryle Quant;  Surgeon: Irving Copas., MD;  Location: [place];  Service: Gastroenterology;  Laterality: N/A;   FLEXIBLE SIGMOIDOSCOPY N/A 08/04/2020   Procedure: FLEXIBLE SIGMOIDOSCOPY;  Surgeon: Rush Landmark Telford Nab., MD;  Location: [facility] ENDOSCOPY;  Service: Gastroenterology;  Laterality: N/A;   HEMOSTASIS CLIP PLACEMENT  11/24/2019   Procedure: HEMOSTASIS CLIP PLACEMENT;  Surgeon: Irving Copas., MD;  Location: [place];  Service: Gastroenterology;;   HEMOSTASIS CLIP PLACEMENT  08/04/2020   Procedure: HEMOSTASIS CLIP PLACEMENT;  Surgeon: Irving Copas., MD;  Location: [facility] ENDOSCOPY;  Service: Gastroenterology;;   POLYPECTOMY     SUBMUCOSAL LIFTING INJECTION  11/24/2019   Procedure: SUBMUCOSAL LIFTING INJECTION;  Surgeon: Irving Copas., MD;  Location: [place];  Service: Gastroenterology;;   SUBMUCOSAL LIFTING INJECTION  08/04/2020   Procedure: SUBMUCOSAL LIFTING INJECTION;  Surgeon: Irving Copas., MD;  Location: [facility] ENDOSCOPY;  Service: Gastroenterology;;   TONSILLECTOMY      Family History  Problem Relation Age of Onset   Colon polyps Mother    Esophageal cancer Father    Colon cancer Neg Hx    Rectal cancer Neg Hx    Stomach cancer Neg Hx    Inflammatory bowel disease Neg Hx    Liver disease Neg Hx    Pancreatic cancer Neg Hx     Social History   Socioeconomic History   Marital status: Single  Spouse name: Not on file   Number of children: Not on file   Years of education: Not on file   Highest education level: Not on file  Occupational  History   Occupation: Retired     Comment: about a year and half ago  Tobacco Use   Smoking status: Former    Years: 36.00    Types: Cigarettes   Smokeless tobacco: Never  Vaping Use   Vaping Use: Never used  Substance and Sexual Activity   Alcohol use: Not Currently   Drug use: Never   Sexual activity: Not Currently  Other Topics Concern   Not on file  Social History Narrative   Not on file   Social Determinants of Health   Financial Resource Strain: [place]  (07/20/2022)   Overall Financial Resource Strain (CARDIA)    Difficulty of Paying Living Expenses: Not hard at all  Food Insecurity: No Food Insecurity (07/20/2022)   Hunger Vital Sign    Worried About Running Out of Food in the Last Year: Never true    [place] in the Last Year: Never true  Transportation Needs: No Transportation Needs (07/20/2022)   PRAPARE - Hydrologist (Medical): No    Lack of Transportation (Non-Medical): No  Physical Activity: Sufficiently Active (07/11/2021)   Exercise Vital Sign    Days of Exercise per Week: 7 days    Minutes of Exercise per Session: 60 min  Stress: No Stress Concern Present (07/20/2022)   [place]    Feeling of Stress : Not at all  Social Connections: Moderately Integrated (07/20/2022)   Social Connection and Isolation Panel [NHANES]    Frequency of Communication with Friends and Family: More than three times a week    Frequency of Social Gatherings with Friends and Family: More than three times a week    Attends Religious Services: More than 4 times per year    Active Member of [employer] or Organizations: Yes    Attends Archivist Meetings: More than 4 times per year    Marital Status: Never married  Intimate Partner Violence: Not At Risk (07/20/2022)   Humiliation, Afraid, Rape, and Kick questionnaire    Fear of Current or Ex-Partner: No    Emotionally  Abused: No    Physically Abused: No    Sexually Abused: No    Allergies  Allergen Reactions   Benadryl [Diphenhydramine]     RLS    Current Outpatient Medications  Medication Sig Dispense Refill   albuterol (VENTOLIN HFA) 108 (90 Base) MCG/ACT inhaler Inhale 2 puffs into the lungs every 6 (six) hours as needed for wheezing or shortness of breath. 8 g 0   aspirin EC 81 MG tablet Take 1 tablet (81 mg total) by mouth daily. 30 tablet    Calcium-Magnesium-Vitamin D (CALCIUM 1200+D3 PO) Take 1 tablet by mouth daily.     Coenzyme Q10 (CO Q 10 PO) Take 1 capsule by mouth every evening.     gabapentin (NEURONTIN) 300 MG capsule TAKE 1 CAPSULE BY MOUTH 4  TIMES DAILY 360 capsule 3   hydrochlorothiazide (HYDRODIURIL) 25 MG tablet Take 1 tablet (25 mg total) by mouth daily. 90 tablet 3   levothyroxine (SYNTHROID) 88 MCG tablet Take 1 tablet (88 mcg total) by mouth daily before breakfast. 90 tablet 3   Multiple Vitamin (MULTIVITAMIN WITH MINERALS) TABS tablet Take 1 tablet by mouth daily.  Polyethyl Glycol-Propyl Glycol (SYSTANE OP) Place 1 drop into both eyes daily as needed (Dry eyes).      REPATHA SURECLICK XX123456 MG/ML SOAJ INJECT 140 MG INTO THE SKIN EVERY 14 DAYS 2 mL 11   rOPINIRole (REQUIP) 1 MG tablet Take 1-2 tablets (1-2 mg total) by mouth at bedtime. 180 tablet 2   rosuvastatin (CRESTOR) 5 MG tablet TAKE ONE TABLET BY MOUTH DAILY 90 tablet 3   traMADol (ULTRAM) 50 MG tablet TAKE TWO TABLETS BY MOUTH EVERY 8 HOURS AS NEEDED 180 tablet 1   Turmeric 500 MG CAPS Take 500 mg by mouth at bedtime.     No current facility-administered medications for this visit.    PHYSICAL EXAM There were no vitals filed for this visit.  Constitutional: *** appearing. *** distress. Appears *** nourished.  Neurologic: CN ***. *** focal findings. *** sensory loss. Psychiatric: *** Mood and affect symmetric and appropriate. Eyes: *** No icterus. No conjunctival pallor. Ears, nose, throat: *** mucous  membranes moist. Midline trachea.  Cardiac: *** rate and rhythm.  Respiratory: *** unlabored. Abdominal: *** soft, non-tender, non-distended.  Peripheral vascular: *** Extremity: *** edema. *** cyanosis. *** pallor.  Skin: *** gangrene. *** ulceration.  Lymphatic: *** Stemmer's sign. *** palpable lymphadenopathy.    PERTINENT LABORATORY AND RADIOLOGIC DATA  Most recent CBC    Latest Ref Rng & Units 07/14/2022   10:52 AM 08/01/2021    1:46 PM 07/23/2020   10:52 AM  CBC  WBC 4.0 - 10.5 K/uL 6.4  5.1  5.5   Hemoglobin 12.0 - 15.0 g/dL 14.4  14.8  13.1   Hematocrit 36.0 - 46.0 % 43.1  44.3  39.3   Platelets 150.0 - 400.0 K/uL 327.0  293.0  256      Most recent CMP    Latest Ref Rng & Units 09/26/2022    3:19 PM 07/14/2022   10:52 AM 05/16/2022    2:24 PM  CMP  Glucose 70 - 99 mg/dL 97  99  94   BUN 6 - 23 mg/dL '25  22  22   '$ Creatinine 0.40 - 1.20 mg/dL 0.61  0.59  0.58   Sodium 135 - 145 mEq/L 137  135  135   Potassium 3.5 - 5.1 mEq/L 4.3  5.2 No hemolysis seen  4.3   Chloride 96 - 112 mEq/L 104  102  103   CO2 19 - 32 mEq/L '28  27  26   '$ Calcium 8.4 - 10.5 mg/dL 9.8  9.5  9.3   Total Protein 6.0 - 8.3 g/dL  6.6    Total Bilirubin 0.2 - 1.2 mg/dL  0.3    Alkaline Phos 39 - 117 U/L  52    AST 0 - 37 U/L  22    ALT 0 - 35 U/L  18      Renal function CrCl cannot be calculated (Patient's most recent lab result is older than the maximum 21 days allowed.).  Hgb A1c MFr Bld (% of total Hgb)  Date Value  07/23/2020 4.8    LDL Cholesterol (Calc)  Date Value Ref Range Status  07/23/2020 94 mg/dL (calc) Final    Comment:    Reference range: <100 . Desirable range <100 mg/dL for primary prevention;   <70 mg/dL for patients with CHD or diabetic patients  with > or = 2 CHD risk factors. [name] LDL-C is now calculated using the Martin-Hopkins  calculation, which is a validated novel method providing  better accuracy  than the Friedewald equation in the  estimation of LDL-C.   Cresenciano Genre et al. Annamaria Helling. WG:[id number]): 2061-2068  (http://education.[facility].com/faq/FAQ164)    LDL Chol Calc (NIH)  Date Value Ref Range Status  01/30/2022 38 0 - 99 mg/dL Final   LDL Cholesterol  Date Value Ref Range Status  07/14/2022 62 0 - 99 mg/dL Final     Vascular Imaging: ***  Azariya Freeman N. Stanford Breed, MD FACS [facility] Phone Number: [phone] 12/03/2022 8:37 PM   Total time spent on preparing this encounter including chart review, data review, collecting history, examining the patient, coordinating care for this {tnhtimebilling:26202}  Portions of this report may have been transcribed using voice recognition software.  Every effort has been made to ensure accuracy; however, inadvertent computerized transcription errors may still be present.

## 2022-12-04 ENCOUNTER — Ambulatory Visit (HOSPITAL_COMMUNITY): Payer: Medicare HMO

## 2022-12-04 ENCOUNTER — Encounter: Payer: Medicare HMO | Admitting: Vascular Surgery

## 2022-12-11 ENCOUNTER — Other Ambulatory Visit: Payer: Self-pay | Admitting: Internal Medicine

## 2022-12-11 DIAGNOSIS — Z1231 Encounter for screening mammogram for malignant neoplasm of breast: Secondary | ICD-10-CM

## 2022-12-18 ENCOUNTER — Ambulatory Visit
Admission: RE | Admit: 2022-12-18 | Discharge: 2022-12-18 | Disposition: A | Discharge status: Home / Self Care | Payer: Medicare HMO | Source: Ambulatory Visit | Attending: Internal Medicine | Admitting: Internal Medicine

## 2022-12-18 DIAGNOSIS — Z1231 Encounter for screening mammogram for malignant neoplasm of breast: Secondary | ICD-10-CM | POA: Diagnosis not present

## 2022-12-19 ENCOUNTER — Encounter: Payer: Self-pay | Admitting: Cardiovascular Disease

## 2023-01-02 ENCOUNTER — Encounter: Payer: Self-pay | Admitting: Internal Medicine

## 2023-01-02 ENCOUNTER — Other Ambulatory Visit: Payer: Self-pay

## 2023-01-02 DIAGNOSIS — G2581 Restless legs syndrome: Secondary | ICD-10-CM

## 2023-01-02 DIAGNOSIS — Z8639 Personal history of other endocrine, nutritional and metabolic disease: Secondary | ICD-10-CM

## 2023-01-02 MED ORDER — LEVOTHYROXINE SODIUM 88 MCG PO TABS: 88.0000 ug | ORAL_TABLET | Freq: Every day | ORAL | 3 refills | Status: DC

## 2023-01-03 ENCOUNTER — Other Ambulatory Visit: Payer: Self-pay | Admitting: Internal Medicine

## 2023-01-03 ENCOUNTER — Encounter: Payer: Self-pay | Admitting: Internal Medicine

## 2023-01-04 ENCOUNTER — Other Ambulatory Visit: Payer: Self-pay

## 2023-01-04 ENCOUNTER — Encounter: Payer: Self-pay | Admitting: Internal Medicine

## 2023-01-04 ENCOUNTER — Other Ambulatory Visit (INDEPENDENT_AMBULATORY_CARE_PROVIDER_SITE_OTHER): Payer: Medicare HMO

## 2023-01-04 DIAGNOSIS — Z8639 Personal history of other endocrine, nutritional and metabolic disease: Secondary | ICD-10-CM | POA: Diagnosis not present

## 2023-01-04 DIAGNOSIS — G2581 Restless legs syndrome: Secondary | ICD-10-CM

## 2023-01-04 LAB — CBC WITH DIFFERENTIAL/PLATELET
Basophils Absolute: 0.1 10*3/uL (ref 0.0–0.1)
Basophils Relative: 1.2 % (ref 0.0–3.0)
Eosinophils Absolute: 0.2 10*3/uL (ref 0.0–0.7)
Eosinophils Relative: 3.2 % (ref 0.0–5.0)
HCT: 41.3 % (ref 36.0–46.0)
Hemoglobin: 14.3 g/dL (ref 12.0–15.0)
Lymphocytes Relative: 25.8 % (ref 12.0–46.0)
Lymphs Abs: 1.7 10*3/uL (ref 0.7–4.0)
MCHC: 34.6 g/dL (ref 30.0–36.0)
MCV: 88.9 fl (ref 78.0–100.0)
Monocytes Absolute: 0.5 10*3/uL (ref 0.1–1.0)
Monocytes Relative: 7.6 % (ref 3.0–12.0)
Neutro Abs: 4.2 10*3/uL (ref 1.4–7.7)
Neutrophils Relative %: 62.2 % (ref 43.0–77.0)
Platelets: 298 10*3/uL (ref 150.0–400.0)
RBC: 4.65 Mil/uL (ref 3.87–5.11)
RDW: 13.7 % (ref 11.5–15.5)
WBC: 6.7 10*3/uL (ref 4.0–10.5)

## 2023-01-04 LAB — IBC PANEL
Iron: 39 ug/dL — ABNORMAL LOW (ref 42–145)
Saturation Ratios: 9.4 % — ABNORMAL LOW (ref 20.0–50.0)
TIBC: 413 ug/dL (ref 250.0–450.0)
Transferrin: 295 mg/dL (ref 212.0–360.0)

## 2023-01-04 LAB — FERRITIN: Ferritin: 63.7 ng/mL (ref 10.0–291.0)

## 2023-01-04 MED ORDER — ROPINIROLE HCL 1 MG PO TABS: 1.0000 mg | ORAL_TABLET | Freq: Every day | ORAL | 2 refills | Status: DC

## 2023-01-05 ENCOUNTER — Encounter: Payer: Self-pay | Admitting: Internal Medicine

## 2023-01-07 ENCOUNTER — Encounter: Payer: Self-pay | Admitting: Internal Medicine

## 2023-01-07 DIAGNOSIS — E611 Iron deficiency: Secondary | ICD-10-CM | POA: Insufficient documentation

## 2023-01-20 ENCOUNTER — Encounter: Payer: Self-pay | Admitting: Internal Medicine

## 2023-01-26 ENCOUNTER — Other Ambulatory Visit: Payer: Self-pay | Admitting: Cardiovascular Disease

## 2023-01-26 DIAGNOSIS — E7800 Pure hypercholesterolemia, unspecified: Secondary | ICD-10-CM

## 2023-01-26 DIAGNOSIS — I2510 Atherosclerotic heart disease of native coronary artery without angina pectoris: Secondary | ICD-10-CM

## 2023-01-29 MED ORDER — GABAPENTIN 300 MG PO CAPS: ORAL_CAPSULE | ORAL | 1 refills | Status: DC

## 2023-01-29 NOTE — Addendum Note (Signed)
Addended by: Pincus Sanes on: 01/29/2023 08:47 PM   Modules accepted: Orders

## 2023-01-30 ENCOUNTER — Other Ambulatory Visit: Payer: Self-pay | Admitting: Internal Medicine

## 2023-02-01 ENCOUNTER — Encounter: Payer: Self-pay | Admitting: Internal Medicine

## 2023-02-01 ENCOUNTER — Other Ambulatory Visit: Payer: Self-pay | Admitting: Internal Medicine

## 2023-02-13 ENCOUNTER — Encounter: Payer: Self-pay | Admitting: Internal Medicine

## 2023-02-13 DIAGNOSIS — G2581 Restless legs syndrome: Secondary | ICD-10-CM

## 2023-02-15 ENCOUNTER — Other Ambulatory Visit: Payer: Self-pay | Admitting: Internal Medicine

## 2023-02-16 ENCOUNTER — Encounter: Payer: Self-pay | Admitting: Neurology

## 2023-02-16 MED ORDER — CLONAZEPAM 0.5 MG PO TABS: 0.5000 mg | ORAL_TABLET | Freq: Every day | ORAL | 2 refills | Status: DC

## 2023-02-16 NOTE — Addendum Note (Signed)
Addended by: Pincus Sanes on: 02/16/2023 08:17 AM   Modules accepted: Orders

## 2023-02-26 ENCOUNTER — Ambulatory Visit: Payer: Medicare HMO | Admitting: Physician Assistant

## 2023-02-26 ENCOUNTER — Ambulatory Visit (HOSPITAL_COMMUNITY)
Admission: RE | Admit: 2023-02-26 | Discharge: 2023-02-26 | Disposition: A | Discharge status: Home / Self Care | Payer: Medicare HMO | Source: Ambulatory Visit | Attending: Physician Assistant | Admitting: Physician Assistant

## 2023-02-26 ENCOUNTER — Other Ambulatory Visit: Payer: Self-pay | Admitting: Cardiovascular Disease

## 2023-02-26 VITALS — BP 133/86 | HR 79 | Temp 97.0°F | Resp 16 | Ht 62.0 in | Wt 112.3 lb

## 2023-02-26 DIAGNOSIS — G2581 Restless legs syndrome: Secondary | ICD-10-CM

## 2023-02-26 DIAGNOSIS — E7800 Pure hypercholesterolemia, unspecified: Secondary | ICD-10-CM

## 2023-02-26 DIAGNOSIS — R6889 Other general symptoms and signs: Secondary | ICD-10-CM | POA: Diagnosis not present

## 2023-02-26 DIAGNOSIS — I2510 Atherosclerotic heart disease of native coronary artery without angina pectoris: Secondary | ICD-10-CM

## 2023-02-26 LAB — VAS US ABI WITH/WO TBI
Left ABI: 1.28
Right ABI: 1.11

## 2023-02-26 NOTE — Progress Notes (Signed)
Office Note     CC:  follow up Requesting Provider:  Pincus Sanes, MD  HPI: Crystal Barnes is a 72 y.o. (08-Mar-1951) female who presents after abnormal home screening exam. She reports restless legs that have been progressing over the past 10 years. At this point she is bothered nightly from her restless legs. She says occasionally in the afternoons she will get some restlessness, but she gets up and walks and it will go away. She is currently taking Requip and Klonopin to mitigate her symptoms. She denies any pain in her legs on ambulation or rest. No tissue loss. She walks 5 miles daily.  She has significant family history of restless legs in both her sister and father. She has risk factors for PAD with prior CAD with stent placement, HLD, HTN, and former smoking. She quit in 1974. She also reports that she has Raynaud's. She has learned to deal with this keeping her feet and hands warm. She reports that she has evaluation with Neurology for her RLS next month.   Past Medical History:  Diagnosis Date   Arthritis    CAD (coronary artery disease)    Cataract    bilateral-removed   Hyperlipidemia    Hypertension    Hypothyroidism    Myocardial infarction [facility])    Thyroid disease     Past Surgical History:  Procedure Laterality Date   ABDOMINAL HYSTERECTOMY  2008   CATARACT EXTRACTION Bilateral    COLONOSCOPY     CORONARY STENT PLACEMENT  2014   ENDOSCOPIC MUCOSAL RESECTION N/A 11/24/2019   Procedure: ENDOSCOPIC MUCOSAL RESECTION;  Surgeon: Lemar Lofty., MD;  Location: [facility] ENDOSCOPY;  Service: Gastroenterology;  Laterality: N/A;   ENDOSCOPIC MUCOSAL RESECTION  08/04/2020   Procedure: ENDOSCOPIC MUCOSAL RESECTION;  Surgeon: Meridee Score Netty Starring., MD;  Location: [facility] ENDOSCOPY;  Service: Gastroenterology;;   Wenda Low SIGMOIDOSCOPY N/A 11/24/2019   Procedure: Arnell Sieving;  Surgeon: Lemar Lofty., MD;  Location: [facility] ENDOSCOPY;  Service:  Gastroenterology;  Laterality: N/A;   FLEXIBLE SIGMOIDOSCOPY N/A 08/04/2020   Procedure: FLEXIBLE SIGMOIDOSCOPY;  Surgeon: Meridee Score Netty Starring., MD;  Location: [facility] ENDOSCOPY;  Service: Gastroenterology;  Laterality: N/A;   HEMOSTASIS CLIP PLACEMENT  11/24/2019   Procedure: HEMOSTASIS CLIP PLACEMENT;  Surgeon: Lemar Lofty., MD;  Location: [facility] ENDOSCOPY;  Service: Gastroenterology;;   HEMOSTASIS CLIP PLACEMENT  08/04/2020   Procedure: HEMOSTASIS CLIP PLACEMENT;  Surgeon: Lemar Lofty., MD;  Location: [facility] ENDOSCOPY;  Service: Gastroenterology;;   POLYPECTOMY     SUBMUCOSAL LIFTING INJECTION  11/24/2019   Procedure: SUBMUCOSAL LIFTING INJECTION;  Surgeon: Lemar Lofty., MD;  Location: [facility] ENDOSCOPY;  Service: Gastroenterology;;   SUBMUCOSAL LIFTING INJECTION  08/04/2020   Procedure: SUBMUCOSAL LIFTING INJECTION;  Surgeon: Lemar Lofty., MD;  Location: [facility] ENDOSCOPY;  Service: Gastroenterology;;   TONSILLECTOMY      Social History   Socioeconomic History   Marital status: Single    Spouse name: Not on file   Number of children: Not on file   Years of education: Not on file   Highest education level: Not on file  Occupational History   Occupation: Retired     Comment: about a year and half ago  Tobacco Use   Smoking status: Former    Years: 36    Types: Cigarettes   Smokeless tobacco: Never  Vaping Use   Vaping Use: Never used  Substance and Sexual Activity   Alcohol use: Not Currently   Drug use: Never  Sexual activity: Not Currently  Other Topics Concern   Not on file  Social History Narrative   Not on file   Social Determinants of Health   Financial Resource Strain: Low Risk  (07/20/2022)   Overall Financial Resource Strain (CARDIA)    Difficulty of Paying Living Expenses: Not hard at all  Food Insecurity: No Food Insecurity (07/20/2022)   Hunger Vital Sign    Worried About Running Out of Food in the Last Year: Never true    Ran  Out of Food in the Last Year: Never true  Transportation Needs: No Transportation Needs (07/20/2022)   PRAPARE - Administrator, Civil Service (Medical): No    Lack of Transportation (Non-Medical): No  Physical Activity: Sufficiently Active (07/11/2021)   Exercise Vital Sign    Days of Exercise per Week: 7 days    Minutes of Exercise per Session: 60 min  Stress: No Stress Concern Present (07/20/2022)   [employer] of Occupational Health - Occupational Stress Questionnaire    Feeling of Stress : Not at all  Social Connections: Moderately Integrated (07/20/2022)   Social Connection and Isolation Panel [NHANES]    Frequency of Communication with Friends and Family: More than three times a week    Frequency of Social Gatherings with Friends and Family: More than three times a week    Attends Religious Services: More than 4 times per year    Active Member of [employer] or Organizations: Yes    Attends Engineer, structural: More than 4 times per year    Marital Status: Never married  Intimate Partner Violence: Not At Risk (07/20/2022)   Humiliation, Afraid, Rape, and Kick questionnaire    Fear of Current or Ex-Partner: No    Emotionally Abused: No    Physically Abused: No    Sexually Abused: No    Family History  Problem Relation Age of Onset   Colon polyps Mother    Esophageal cancer Father    Colon cancer Neg Hx    Rectal cancer Neg Hx    Stomach cancer Neg Hx    Inflammatory bowel disease Neg Hx    Liver disease Neg Hx    Pancreatic cancer Neg Hx     Current Outpatient Medications  Medication Sig Dispense Refill   albuterol (VENTOLIN HFA) 108 (90 Base) MCG/ACT inhaler Inhale 2 puffs into the lungs every 6 (six) hours as needed for wheezing or shortness of breath. (Patient not taking: Reported on 02/26/2023) 8 g 0   aspirin EC 81 MG tablet Take 1 tablet (81 mg total) by mouth daily. 30 tablet    Calcium-Magnesium-Vitamin D (CALCIUM 1200+D3 PO) Take 1 tablet  by mouth daily.     clonazePAM (KLONOPIN) 0.5 MG tablet Take 1 tablet (0.5 mg total) by mouth at bedtime. 30 tablet 2   Coenzyme Q10 (CO Q 10 PO) Take 1 capsule by mouth every evening.     Evolocumab (REPATHA SURECLICK) 140 MG/ML SOAJ INJECT 140 MG INTO THE SKIN EVERY 14 DAYS 6 mL 0   gabapentin (NEURONTIN) 300 MG capsule TAKE ONE CAPSULE BY MOUTH FOUR TIMES A DAY 360 capsule 1   hydrochlorothiazide (HYDRODIURIL) 25 MG tablet Take 1 tablet (25 mg total) by mouth daily. 90 tablet 3   levothyroxine (SYNTHROID) 88 MCG tablet Take 1 tablet (88 mcg total) by mouth daily before breakfast. 90 tablet 3   Multiple Vitamin (MULTIVITAMIN WITH MINERALS) TABS tablet Take 1 tablet by mouth daily.  Polyethyl Glycol-Propyl Glycol (SYSTANE OP) Place 1 drop into both eyes daily as needed (Dry eyes).      rOPINIRole (REQUIP) 1 MG tablet Take 1-2 tablets (1-2 mg total) by mouth at bedtime. 180 tablet 2   rosuvastatin (CRESTOR) 5 MG tablet TAKE ONE TABLET BY MOUTH DAILY 90 tablet 3   traMADol (ULTRAM) 50 MG tablet TAKE 2 TABLETS BY MOUTH EVERY 8 HOURS AS NEEDED 180 tablet 0   Turmeric 500 MG CAPS Take 500 mg by mouth at bedtime.     No current facility-administered medications for this visit.    Allergies  Allergen Reactions   Benadryl [Diphenhydramine]     RLS     REVIEW OF SYSTEMS:  [X]  denotes positive finding, [ ]  denotes negative finding Cardiac  Comments:  Chest pain or chest pressure:    Shortness of breath upon exertion:    Short of breath when lying flat:    Irregular heart rhythm:        Vascular    Pain in calf, thigh, or hip brought on by ambulation:    Pain in feet at night that wakes you up from your sleep:     Blood clot in your veins:    Leg swelling:         Pulmonary    Oxygen at home:    Productive cough:     Wheezing:         Neurologic    Sudden weakness in arms or legs:     Sudden numbness in arms or legs:     Sudden onset of difficulty speaking or slurred speech:     Temporary loss of vision in one eye:     Problems with dizziness:         Gastrointestinal    Blood in stool:     Vomited blood:         Genitourinary    Burning when urinating:     Blood in urine:        Psychiatric    Major depression:         Hematologic    Bleeding problems:    Problems with blood clotting too easily:        Skin    Rashes or ulcers:        Constitutional    Fever or chills:      PHYSICAL EXAMINATION:  Vitals:   02/26/23 1024  BP: 133/86  Pulse: 79  Resp: 16  Temp: (!) 97 F (36.1 C)  TempSrc: Temporal  SpO2: 99%  Weight: 112 lb 4.8 oz (50.9 kg)  Height: 5\' 2"  (1.575 m)    General:  WDWN in NAD; vital signs documented above Gait: Normal HENT: WNL, normocephalic Pulmonary: normal non-labored breathing , without wheezing Cardiac: regular HR Abdomen: soft Vascular Exam/Pulses: 2+ femoral, 2+ popliteal, DP and PT pulses bilaterally. Feet warm and well perfused Extremities: without ischemic changes, without Gangrene , without cellulitis; without open wounds;  Musculoskeletal: no muscle wasting or atrophy  Neurologic: A&O X 3 Psychiatric:  The pt has Normal affect.   Non-Invasive Vascular Imaging:   +-------+-----------+-----------+------------+------------+  ABI/TBIToday's ABIToday's TBIPrevious ABIPrevious TBI  +-------+-----------+-----------+------------+------------+  Right 1.11       0.82                                 +-------+-----------+-----------+------------+------------+  Left  1.28       0.83                                 +-------+-----------+-----------+------------+------------+  Right Toe pressure of 121 mmHg Left toe pressure of 123 mmHg Biphasic flow bilaterally   ASSESSMENT/PLAN:: 72 y.o. female here evaluation of abnormal screening ABI. She is without any claudication, rest pain or tissue loss. She has significant restless legs that has been progressing over past 10 years. She is very active  walking 5 miles a day. While she has risk factors for PAD she is currently not symptomatic and her ABI today is essentially normal. Her restless leg syndrome is likely from other underlying cause. She also has significant family history which is likely contributing.  - Continue Repatha and Aspirin - Continue walking regimen - She can follow up as needed if she has new or concerning symptoms   Graceann Congress, PA-C [facility] [phone]  Clinic MD:   Myra Gianotti

## 2023-03-02 ENCOUNTER — Other Ambulatory Visit: Payer: Self-pay | Admitting: Internal Medicine

## 2023-03-02 MED ORDER — TRAMADOL HCL 50 MG PO TABS: ORAL_TABLET | ORAL | 0 refills | Status: DC

## 2023-03-04 ENCOUNTER — Encounter: Payer: Self-pay | Admitting: Internal Medicine

## 2023-03-06 MED ORDER — ROPINIROLE HCL 1 MG PO TABS: 2.0000 mg | ORAL_TABLET | Freq: Every day | ORAL | 0 refills | Status: DC

## 2023-03-21 NOTE — Progress Notes (Signed)
Initial neurology clinic note  Reason for Evaluation: Consultation requested by Pincus Sanes, MD for an opinion regarding restless legs. My final recommendations will be communicated back to the requesting physician by way of shared medical record or letter to requesting physician via [place] mail.  HPI: This is [name] [place], a 72 y.o. right-handed female with a medical history of HTN, HLD, CAD s/p stent, OA, hypothyroidism, Raynaud's, chronic pelvic pain, and former smoker who presents to neurology clinic with the chief complaint of restless legs. The patient is alone today.  Patient's symptoms started over 10 years. 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 She would get muscle jerks in her legs when going to bed. It was very periodic until about 3 years ago when she moved to [place]. Now symptoms have been present every day.   She currently feels symptoms in legs, usually after dinner. It usually starts in the hip and sends "zing" like pain into her legs about every 10 seconds. It feels like "nails on a chalk board", but never above the hips. She feels the urge to move. When she gets up and walks, symptoms improve.  She was started on requip 0.25 mg nightly. This worked great for a while. She had to slowly increase over the years. She is currently between 3-4 mg of requip nightly. Klonopin was also added, 0.5 mg at night, about 1 month ago. She also started OTC iron (ferrous sulfate 325 mg) about 1-2 months ago.  Overall, currently her restless leg symptoms are well controlled. She does not feel her symptoms worsened with requip, happen earlier in the night, or spread to other body parts.  She is also on gabapentin for 20 years for chronic pelvic pain. She takes 300 mg QID. She also takes tramadol for pelvic pain. This really helps.   She denies numbness and tingling in feet. She denies significant back pain. She denies imbalance and falls. She is very active and  goes to the gym daily.  She doe not report any constitutional symptoms like fever, night sweats, anorexia or unintentional weight loss.  She sleeps well. She does not think she snores. She is not tired throughout the day. She has never had a sleep study.  EtOH use: Rare  Restrictive diet? Previously was a vegan, but now not vegan for the last year. Family history of neuropathy/myopathy/NM disease? Grandfather and sister had terrible restless legs  She has never had an EMG.  MEDICATIONS:  Outpatient Encounter Medications as of 03/28/2023  Medication Sig   aspirin EC 81 MG tablet Take 1 tablet (81 mg total) by mouth daily.   Calcium-Magnesium-Vitamin D (CALCIUM 1200+D3 PO) Take 1 tablet by mouth daily.   clonazePAM (KLONOPIN) 0.5 MG tablet Take 1 tablet (0.5 mg total) by mouth at bedtime.   Coenzyme Q10 (CO Q 10 PO) Take 1 capsule by mouth every evening.   Evolocumab (REPATHA SURECLICK) 140 MG/ML SOAJ INJECT 140 MG INTO THE SKIN EVERY 14 DAYS   ferrous sulfate 325 (65 FE) MG EC tablet Take 325 mg by mouth once.   gabapentin (NEURONTIN) 300 MG capsule TAKE ONE CAPSULE BY MOUTH FOUR TIMES A DAY   hydrochlorothiazide (HYDRODIURIL) 25 MG tablet Take 1 tablet (25 mg total) by mouth daily.   levothyroxine (SYNTHROID) 88 MCG tablet Take 1 tablet (88 mcg total) by mouth daily before breakfast.   Multiple Vitamin (MULTIVITAMIN WITH MINERALS) TABS tablet Take 1 tablet by mouth daily.  Polyethyl Glycol-Propyl Glycol (SYSTANE OP) Place 1 drop into both eyes daily as needed (Dry eyes).    rOPINIRole (REQUIP) 1 MG tablet Take 2-3 tablets (2-3 mg total) by mouth at bedtime.   rosuvastatin (CRESTOR) 5 MG tablet TAKE ONE TABLET BY MOUTH DAILY   traMADol (ULTRAM) 50 MG tablet TAKE 2 TABLETS BY MOUTH EVERY 8 HOURS AS NEEDED   Turmeric 500 MG CAPS Take 500 mg by mouth at bedtime.   No facility-administered encounter medications on file as of 03/28/2023.    PAST MEDICAL HISTORY: Past Medical History:   Diagnosis Date   Arthritis    CAD (coronary artery disease)    Cataract    bilateral-removed   Hyperlipidemia    Hypertension    Hypothyroidism    Myocardial infarction [facility])    Thyroid disease     PAST SURGICAL HISTORY: Past Surgical History:  Procedure Laterality Date   ABDOMINAL HYSTERECTOMY  2008   CATARACT EXTRACTION Bilateral    COLONOSCOPY     CORONARY STENT PLACEMENT  2014   ENDOSCOPIC MUCOSAL RESECTION N/A 11/24/2019   Procedure: ENDOSCOPIC MUCOSAL RESECTION;  Surgeon: Lemar Lofty., MD;  Location: [facility] ENDOSCOPY;  Service: Gastroenterology;  Laterality: N/A;   ENDOSCOPIC MUCOSAL RESECTION  08/04/2020   Procedure: ENDOSCOPIC MUCOSAL RESECTION;  Surgeon: Meridee Score Netty Starring., MD;  Location: [facility] ENDOSCOPY;  Service: Gastroenterology;;   Wenda Low SIGMOIDOSCOPY N/A 11/24/2019   Procedure: Arnell Sieving;  Surgeon: Lemar Lofty., MD;  Location: [facility] ENDOSCOPY;  Service: Gastroenterology;  Laterality: N/A;   FLEXIBLE SIGMOIDOSCOPY N/A 08/04/2020   Procedure: FLEXIBLE SIGMOIDOSCOPY;  Surgeon: Meridee Score Netty Starring., MD;  Location: [facility] ENDOSCOPY;  Service: Gastroenterology;  Laterality: N/A;   HEMOSTASIS CLIP PLACEMENT  11/24/2019   Procedure: HEMOSTASIS CLIP PLACEMENT;  Surgeon: Lemar Lofty., MD;  Location: [facility] ENDOSCOPY;  Service: Gastroenterology;;   HEMOSTASIS CLIP PLACEMENT  08/04/2020   Procedure: HEMOSTASIS CLIP PLACEMENT;  Surgeon: Lemar Lofty., MD;  Location: [facility] ENDOSCOPY;  Service: Gastroenterology;;   POLYPECTOMY     SUBMUCOSAL LIFTING INJECTION  11/24/2019   Procedure: SUBMUCOSAL LIFTING INJECTION;  Surgeon: Lemar Lofty., MD;  Location: [facility] ENDOSCOPY;  Service: Gastroenterology;;   SUBMUCOSAL LIFTING INJECTION  08/04/2020   Procedure: SUBMUCOSAL LIFTING INJECTION;  Surgeon: Lemar Lofty., MD;  Location: [facility] ENDOSCOPY;  Service: Gastroenterology;;   TONSILLECTOMY      ALLERGIES: Allergies   Allergen Reactions   Benadryl [Diphenhydramine]     RLS    FAMILY HISTORY: Family History  Problem Relation Age of Onset   Colon polyps Mother    Esophageal cancer Father    Colon cancer Neg Hx    Rectal cancer Neg Hx    Stomach cancer Neg Hx    Inflammatory bowel disease Neg Hx    Liver disease Neg Hx    Pancreatic cancer Neg Hx     SOCIAL HISTORY: Social History   Tobacco Use   Smoking status: Former    Years: 36    Types: Cigarettes   Smokeless tobacco: Never  Vaping Use   Vaping Use: Never used  Substance Use Topics   Alcohol use: Not Currently   Drug use: Never   Social History   Social History Narrative   Not on file     OBJECTIVE: PHYSICAL EXAM: BP (!) 146/85   Pulse 73   Ht 5\' 2"  (1.575 m)   Wt 111 lb 6.4 oz (50.5 kg)   LMP  (LMP Unknown)   SpO2 96%   BMI  20.38 kg/m   General: General appearance: Awake and alert. No distress. Cooperative with exam.  Skin: No obvious rash or jaundice. HEENT: Atraumatic. Anicteric. Lungs: Non-labored breathing on room air  Extremities: No edema. Psych: Affect appropriate.  Neurological: Mental Status: Alert. Speech fluent. No pseudobulbar affect Cranial Nerves: CNII: No RAPD. Visual fields grossly intact. CNIII, IV, VI: PERRL. No nystagmus. EOMI. CN V: Facial sensation intact bilaterally to fine touch. CN VII: Facial muscles symmetric and strong. No ptosis at rest. CN VIII: Hearing grossly intact bilaterally. CN IX: No hypophonia. CN X: Palate elevates symmetrically. CN XI: Full strength shoulder shrug bilaterally. CN XII: Tongue protrusion full and midline. No atrophy or fasciculations. No significant dysarthria Motor: Tone is normal. Strength is 5/5 in bilateral upper and lower extremities. Reflexes:  Right Left   Bicep 2+ 2+   Tricep 2+ 2+   BrRad 2+ 2+   Knee 2+ 2+   Ankle 2+ 2+    Pathological Reflexes: Babinski: flexor response bilaterally Hoffman: absent bilaterally Troemner: absent  bilaterally Sensation: Pinprick intact in all extremities Coordination: Intact finger-to- nose-finger bilaterally.  Gait: Able to rise from chair with arms crossed unassisted. Normal, narrow-based gait.  Lab and Test Review: Internal labs: 01/04/23: Ferritin 63.7 IBC panel: iron low at 39, sat ratio low at 9.4 CBC unremarkable  TSH (07/14/22): 4.36 Vit D (08/01/21): 103.36 HbA1c (07/23/20): 4.8  ASSESSMENT: Spencer Schifano is a 72 y.o. female who presents for evaluation of restless leg syndrome. She has a relevant medical history of HTN, HLD, CAD s/p stent, OA, hypothyroidism, Raynaud's, chronic pelvic pain, and former smoker. Her neurological examination is essentially normal today. Available diagnostic data is significant for low iron (ferritin of 63.7). Patient's symptoms are consistent with restless leg syndrome. This is no evidence for an underlying or overlapping process such as neuropathy or parkinsonism. The most likely cause of RLS is her iron deficiency. There is no current evidence of augmentation either, though she is essentially maxed out on requip. Tramadol can also cause augmentation in some patients. Again, there is no current evidence of augmentation though. Her symptoms are currently well controlled.  PLAN: -Patient has low ferritin/iron deficiency likely causing RLS symptoms.   -Continue ferrous sulfate 325 mg daily x 3 months.  Discussed not to take with milk.  Discussed that this can cause constipation and to drink plenty of water.  -Take vitamin C, 100-200 mg with each ferrous sulfate dose, or take the ferrous sulfate with small glass of orange juice (if not diabetic)  -Will recommend rechecking ferritin in about 3 months  -Continue requip 3-4 mg at bedtime -Continue klonopin 0.5 mg at bedtime -Continue gabapentin 300 mg four times daily -May be able to lower above medications with improvement of iron levels   -Return to clinic as needed. By patient preference she  would like to follow up with PCP and call if symptoms are worsening.  The impression above as well as the plan as outlined below were extensively discussed with the patient who voiced understanding. All questions were answered to their satisfaction.  When available, results of the above investigations and possible further recommendations will be communicated to the patient via telephone/MyChart. Patient to call office if not contacted after expected testing turnaround time.   Total time spent reviewing records, interview, history/exam, documentation, and coordination of care on day of encounter:  45 min   Thank you for allowing me to participate in patient's care.  If I can answer any additional questions,  I would be pleased to do so.  Jacquelyne Balint, MD   CC: Pincus Sanes, MD [address] [place] [address]  CC: Referring provider: Pincus Sanes, MD [address] [place],  [place] [address]

## 2023-03-26 NOTE — Progress Notes (Signed)
Cardiology Office Note:    Date:  04/02/2023   ID:  Joycie Peek Piazza, DOB 10-Mar-1951, MRN [id number]  PCP:  Pincus Sanes, MD   [facility] Providers Cardiologist:  Thurmon Fair, MD Cardiology APP:  Marcelino Duster, PA  Referring MD: Pincus Sanes, MD   Chief Complaint  Patient presents with   Follow-up    CAD    History of Present Illness:    Crystal Barnes is a 72 y.o. female with a hx of CAD, hyperlipidemia, and hypertension.  She relocated from [facility] to [facility] and establish care with Dr. Royann Shivers.  She has a history of CAD and stenting to the "front wall of her heart" and nonobstructive disease in a different vessel.  She had a nonischemic stress test in 2022.  Echocardiogram in 2022 also reassuring.  For hypertension, she has repeatedly developed hyperkalemia with losartan.  She did not tolerate HCTZ due to extreme weakness. She has had excellent results on Repatha and Crestor.  She tried to stop Crestor but LDL increased.  She was seen 04/06/2022 and remained active without complaints.  She presents today for annual follow-up.  She is doing very well without complaints.  She goes to [employer].  She stays active.  She will have labs checked with her PCP.  She used to be a Pharmacist, hospital.  Past Medical History:  Diagnosis Date   Arthritis    CAD (coronary artery disease)    Cataract    bilateral-removed   Hyperlipidemia    Hypertension    Hypothyroidism    Myocardial infarction [facility])    Thyroid disease     Past Surgical History:  Procedure Laterality Date   ABDOMINAL HYSTERECTOMY  2008   CATARACT EXTRACTION Bilateral    COLONOSCOPY     CORONARY STENT PLACEMENT  2014   ENDOSCOPIC MUCOSAL RESECTION N/A 11/24/2019   Procedure: ENDOSCOPIC MUCOSAL RESECTION;  Surgeon: Lemar Lofty., MD;  Location: [facility] ENDOSCOPY;  Service: Gastroenterology;  Laterality: N/A;   ENDOSCOPIC MUCOSAL RESECTION  08/04/2020    Procedure: ENDOSCOPIC MUCOSAL RESECTION;  Surgeon: Meridee Score Netty Starring., MD;  Location: [facility] ENDOSCOPY;  Service: Gastroenterology;;   Wenda Low SIGMOIDOSCOPY N/A 11/24/2019   Procedure: Arnell Sieving;  Surgeon: Lemar Lofty., MD;  Location: [facility] ENDOSCOPY;  Service: Gastroenterology;  Laterality: N/A;   FLEXIBLE SIGMOIDOSCOPY N/A 08/04/2020   Procedure: FLEXIBLE SIGMOIDOSCOPY;  Surgeon: Meridee Score Netty Starring., MD;  Location: [facility] ENDOSCOPY;  Service: Gastroenterology;  Laterality: N/A;   HEMOSTASIS CLIP PLACEMENT  11/24/2019   Procedure: HEMOSTASIS CLIP PLACEMENT;  Surgeon: Lemar Lofty., MD;  Location: [facility] ENDOSCOPY;  Service: Gastroenterology;;   HEMOSTASIS CLIP PLACEMENT  08/04/2020   Procedure: HEMOSTASIS CLIP PLACEMENT;  Surgeon: Lemar Lofty., MD;  Location: [facility] ENDOSCOPY;  Service: Gastroenterology;;   POLYPECTOMY     SUBMUCOSAL LIFTING INJECTION  11/24/2019   Procedure: SUBMUCOSAL LIFTING INJECTION;  Surgeon: Lemar Lofty., MD;  Location: [facility] ENDOSCOPY;  Service: Gastroenterology;;   SUBMUCOSAL LIFTING INJECTION  08/04/2020   Procedure: SUBMUCOSAL LIFTING INJECTION;  Surgeon: Lemar Lofty., MD;  Location: [facility] ENDOSCOPY;  Service: Gastroenterology;;   TONSILLECTOMY      Current Medications: Current Meds  Medication Sig   aspirin EC 81 MG tablet Take 1 tablet (81 mg total) by mouth daily.   Calcium-Magnesium-Vitamin D (CALCIUM 1200+D3 PO) Take 1 tablet by mouth daily.   clonazePAM (KLONOPIN) 0.5 MG tablet Take 1 tablet (0.5 mg total) by mouth at bedtime.  Coenzyme Q10 (CO Q 10 PO) Take 1 capsule by mouth every evening.   Evolocumab (REPATHA SURECLICK) 140 MG/ML SOAJ INJECT 140 MG INTO THE SKIN EVERY 14 DAYS   ferrous sulfate 325 (65 FE) MG EC tablet Take 325 mg by mouth once.   gabapentin (NEURONTIN) 300 MG capsule TAKE ONE CAPSULE BY MOUTH FOUR TIMES A DAY   hydrochlorothiazide (HYDRODIURIL) 25 MG tablet Take 1 tablet (25 mg  total) by mouth daily.   levothyroxine (SYNTHROID) 88 MCG tablet Take 1 tablet (88 mcg total) by mouth daily before breakfast.   Multiple Vitamin (MULTIVITAMIN WITH MINERALS) TABS tablet Take 1 tablet by mouth daily.   Polyethyl Glycol-Propyl Glycol (SYSTANE OP) Place 1 drop into both eyes daily as needed (Dry eyes).    rOPINIRole (REQUIP) 1 MG tablet Take 2-3 tablets (2-3 mg total) by mouth at bedtime.   rosuvastatin (CRESTOR) 5 MG tablet TAKE ONE TABLET BY MOUTH DAILY   traMADol (ULTRAM) 50 MG tablet TAKE 2 TABLETS BY MOUTH EVERY 8 HOURS AS NEEDED   Turmeric 500 MG CAPS Take 500 mg by mouth at bedtime.     Allergies:   Benadryl [diphenhydramine]   Social History   Socioeconomic History   Marital status: Single    Spouse name: Not on file   Number of children: Not on file   Years of education: Not on file   Highest education level: Not on file  Occupational History   Occupation: Retired     Comment: about a year and half ago  Tobacco Use   Smoking status: Former    Years: 36    Types: Cigarettes   Smokeless tobacco: Never  Vaping Use   Vaping Use: Never used  Substance and Sexual Activity   Alcohol use: Not Currently   Drug use: Never   Sexual activity: Not Currently  Other Topics Concern   Not on file  Social History Narrative   Not on file   Social Determinants of Health   Financial Resource Strain: Low Risk  (07/20/2022)   Overall Financial Resource Strain (CARDIA)    Difficulty of Paying Living Expenses: Not hard at all  Food Insecurity: No Food Insecurity (07/20/2022)   Hunger Vital Sign    Worried About Running Out of Food in the Last Year: Never true    Ran Out of Food in the Last Year: Never true  Transportation Needs: No Transportation Needs (07/20/2022)   PRAPARE - Administrator, Civil Service (Medical): No    Lack of Transportation (Non-Medical): No  Physical Activity: Sufficiently Active (07/11/2021)   Exercise Vital Sign    Days of  Exercise per Week: 7 days    Minutes of Exercise per Session: 60 min  Stress: No Stress Concern Present (07/20/2022)   [employer] of Occupational Health - Occupational Stress Questionnaire    Feeling of Stress : Not at all  Social Connections: Moderately Integrated (07/20/2022)   Social Connection and Isolation Panel [NHANES]    Frequency of Communication with Friends and Family: More than three times a week    Frequency of Social Gatherings with Friends and Family: More than three times a week    Attends Religious Services: More than 4 times per year    Active Member of [employer] or Organizations: Yes    Attends Engineer, structural: More than 4 times per year    Marital Status: Never married     Family History: The patient's family history includes Colon polyps  in her mother; Esophageal cancer in her father. There is no history of Colon cancer, Rectal cancer, Stomach cancer, Inflammatory bowel disease, Liver disease, or Pancreatic cancer.  ROS:   Please see the history of present illness.     All other systems reviewed and are negative.  EKGs/Labs/Other Studies Reviewed:    The following studies were reviewed today:  Cardiac Studies & Procedures     STRESS TESTS  EXERCISE TOLERANCE TEST (ETT) 01/07/2021  Narrative  Exercise stress test: Clinically and electrically negative for ischemia  Excellent exercise tolerance  Normal exercise stress test   ECHOCARDIOGRAM  ECHOCARDIOGRAM COMPLETE 01/04/2021  Narrative ECHOCARDIOGRAM REPORT    Patient Name:   KIMERLY ROWAND Date of Exam: 01/04/2021 Medical Rec #:  [id number]         Height:       62.0 in Accession #:    [id number]        Weight:       112.4 lb Date of Birth:  [birth year]        BSA:          1.496 m Patient Age:    69 years          BP:           120/70 mmHg Patient Gender: F                 HR:           62 bpm. Exam Location:  [address]  Procedure: 2D Echo, 3D Echo, Cardiac Doppler, Color  Doppler and Strain Analysis  Indications:    R07.9 Chest pain  History:        Patient has no prior history of Echocardiogram examinations. Risk Factors:Hypertension and Dyslipidemia. Coronary artery disease. Thyroid disease. Myocardial infarction.  Sonographer:    Daphine Deutscher RDCS Referring Phys: [id number] ALLISON WOLFE  IMPRESSIONS   1. Left ventricular ejection fraction by 3D volume is 68 %. The left ventricle has normal function. The left ventricle has no regional wall motion abnormalities. Left ventricular diastolic parameters were normal. The average left ventricular global longitudinal strain is -22.1 %. The global longitudinal strain is normal. 2. Right ventricular systolic function is normal. The right ventricular size is normal. There is normal pulmonary artery systolic pressure. The estimated right ventricular systolic pressure is 21.7 mmHg. 3. The mitral valve is normal in structure. Trivial mitral valve regurgitation. No evidence of mitral stenosis. 4. The aortic valve is tricuspid. There is mild calcification of the aortic valve. Aortic valve regurgitation is not visualized. No aortic stenosis is present. 5. The inferior vena cava is normal in size with greater than 50% respiratory variability, suggesting right atrial pressure of 3 mmHg.  Conclusion(s)/Recommendation(s): Normal biventricular function without evidence of hemodynamically significant valvular heart disease.  FINDINGS Left Ventricle: Left ventricular ejection fraction by 3D volume is 68 %. The left ventricle has normal function. The left ventricle has no regional wall motion abnormalities. The average left ventricular global longitudinal strain is -22.1 %. The global longitudinal strain is normal. The left ventricular internal cavity size was normal in size. There is no left ventricular hypertrophy. Left ventricular diastolic parameters were normal.  Right Ventricle: The right ventricular size is normal.  No increase in right ventricular wall thickness. Right ventricular systolic function is normal. There is normal pulmonary artery systolic pressure. The tricuspid regurgitant velocity is 2.16 m/s, and with an assumed right atrial pressure of 3 mmHg, the estimated right ventricular systolic pressure  is 21.7 mmHg.  Left Atrium: Left atrial size was normal in size.  Right Atrium: Right atrial size was normal in size.  Pericardium: There is no evidence of pericardial effusion.  Mitral Valve: The mitral valve is normal in structure. Trivial mitral valve regurgitation. No evidence of mitral valve stenosis.  Tricuspid Valve: The tricuspid valve is normal in structure. Tricuspid valve regurgitation is trivial. No evidence of tricuspid stenosis.  Aortic Valve: The aortic valve is tricuspid. There is mild calcification of the aortic valve. Aortic valve regurgitation is not visualized. No aortic stenosis is present.  Pulmonic Valve: The pulmonic valve was normal in structure. Pulmonic valve regurgitation is trivial. No evidence of pulmonic stenosis.  Aorta: The aortic root is normal in size and structure.  Venous: The inferior vena cava is normal in size with greater than 50% respiratory variability, suggesting right atrial pressure of 3 mmHg.  IAS/Shunts: No atrial level shunt detected by color flow Doppler.   LEFT VENTRICLE PLAX 2D LVIDd:         4.10 cm         Diastology LVIDs:         2.20 cm         LV e' medial:    9.46 cm/s LV PW:         0.80 cm         LV E/e' medial:  7.6 LV IVS:        0.60 cm         LV e' lateral:   10.70 cm/s LVOT diam:     1.80 cm         LV E/e' lateral: 6.7 LV SV:         55 LV SV Index:   37              2D LVOT Area:     2.54 cm        Longitudinal Strain 2D Strain GLS  -23.3 % (A2C): 2D Strain GLS  -22.3 % (A3C): 2D Strain GLS  -20.8 % (A4C): 2D Strain GLS  -22.1 % Avg:  3D Volume EF LV 3D EF:    Left ventricular ejection fraction by 3D  volume is 68 %.  3D Volume EF: 3D EF:        68 % LV EDV:       82 ml LV ESV:       26 ml LV SV:        56 ml  RIGHT VENTRICLE             IVC RV Basal diam:  3.80 cm     IVC diam: 1.10 cm RV S prime:     16.60 cm/s TAPSE (M-mode): 2.4 cm  LEFT ATRIUM             Index       RIGHT ATRIUM           Index LA diam:        4.00 cm 2.67 cm/m  RA Area:     10.50 cm LA Vol (A2C):   40.9 ml 27.33 ml/m RA Volume:   24.20 ml  16.17 ml/m LA Vol (A4C):   33.0 ml 22.05 ml/m LA Biplane Vol: 37.9 ml 25.33 ml/m AORTIC VALVE LVOT Vmax:   101.00 cm/s LVOT Vmean:  70.000 cm/s LVOT VTI:    0.216 m  AORTA Ao Root diam: 3.20 cm Ao Asc diam:  3.10 cm  MITRAL  VALVE               TRICUSPID VALVE MV Area (PHT): 2.60 cm    TR Peak grad:   18.7 mmHg MV Decel Time: 292 msec    TR Vmax:        216.00 cm/s MV E velocity: 72.00 cm/s MV A velocity: 72.00 cm/s  SHUNTS MV E/A ratio:  1.00        Systemic VTI:  0.22 m Systemic Diam: 1.80 cm  Weston Brass MD Electronically signed by Weston Brass MD Signature Date/Time: 01/04/2021/12:16:10 PM    Final              EKG:  EKG is ordered today.   EKG Interpretation  Date/Time:  Monday April 02 2023 11:33:55 EDT Ventricular Rate:  63 PR Interval:  138 QRS Duration: 76 QT Interval:  398 QTC Calculation: 407 R Axis:   71 Text Interpretation: Normal sinus rhythm Septal infarct , age undetermined No previous ECGs available Confirmed by Micah Flesher ([id number]) on 04/02/2023 11:50:59 AM    Recent Labs: 07/14/2022: ALT 18; TSH 4.36 09/26/2022: BUN 25; Creatinine, Ser 0.61; Potassium 4.3; Sodium 137 01/04/2023: Hemoglobin 14.3; Platelets 298.0  Recent Lipid Panel    Component Value Date/Time   CHOL 149 07/14/2022 1052   CHOL 122 01/30/2022 1156   TRIG 146.0 07/14/2022 1052   HDL 57.70 07/14/2022 1052   HDL 66 01/30/2022 1156   CHOLHDL 3 07/14/2022 1052   VLDL 29.2 07/14/2022 1052   LDLCALC 62 07/14/2022 1052   LDLCALC 38 01/30/2022 1156    LDLCALC 94 07/23/2020 1052     Risk Assessment/Calculations:                Physical Exam:    VS:  BP 124/82   Pulse 63   Ht 5\' 2"  (1.575 m)   Wt 109 lb 6.4 oz (49.6 kg)   LMP  (LMP Unknown)   SpO2 94%   BMI 20.01 kg/m     Wt Readings from Last 3 Encounters:  04/02/23 109 lb 6.4 oz (49.6 kg)  03/28/23 111 lb 6.4 oz (50.5 kg)  02/26/23 112 lb 4.8 oz (50.9 kg)     GEN:  Well nourished, well developed in no acute distress HEENT: Normal NECK: No JVD; No carotid bruits LYMPHATICS: No lymphadenopathy CARDIAC: RRR, no murmurs, rubs, gallops RESPIRATORY:  Clear to auscultation without rales, wheezing or rhonchi  ABDOMEN: Soft, non-tender, non-distended MUSCULOSKELETAL:  No edema; No deformity  SKIN: Warm and dry NEUROLOGIC:  Alert and oriented x 3 PSYCHIATRIC:  Normal affect   ASSESSMENT:    1. Coronary artery disease involving native coronary artery of native heart without angina pectoris   2. Essential hypertension   3. Hyperlipidemia with target LDL less than 70    PLAN:    In order of problems listed above:  CAD with prior PCI -Continue 81 mg aspirin, Repatha, Crestor -No chest pain   Hypertension - Intolerant to losartan - Previously intolerant to HCTZ due to weakness-although this is listed on her current med list -BP well-controlled, no medication changes   Hyperlipidemia with LDL goal less than 70 - On Repatha and 5 mg Crestor 07/14/2022: Cholesterol 149; HDL 57.70; LDL Cholesterol 62; Triglycerides 146.0; VLDL 29.2 -Will have labs checked with PCP   Follow-up with Dr. Royann Shivers in 1 year.      Medication Adjustments/Labs and Tests Ordered: Current medicines are reviewed at length with the patient today.  Concerns regarding medicines  are outlined above.  Orders Placed This Encounter  Procedures   EKG 12-Lead   No orders of the defined types were placed in this encounter.   Patient Instructions  Medication Instructions:  The current  medical regimen is effective;  continue present plan and medications as directed. Please refer to the Current Medication list given to you today.  *If you need a refill on your cardiac medications before your next appointment, please call your pharmacy*  Lab Work: NONE ordered at this time of appointment   If you have labs (blood work) drawn today and your tests are completely normal, you will receive your results only by:  MyChart Message (if you have MyChart) OR A paper copy in the mail If you have any lab test that is abnormal or we need to change your treatment, we will call you to review the results.  Follow-Up: At [facility], you and your health needs are our priority.  As part of our continuing mission to provide you with exceptional heart care, we have created designated Provider Care Teams.  These Care Teams include your primary Cardiologist (physician) and Advanced Practice Providers (APPs -  Physician Assistants and Nurse Practitioners) who all work together to provide you with the care you need, when you need it.  Your next appointment:   12 month(s)  Provider:   Thurmon Fair, MD     Other Instructions    Signed, Marcelino Duster, [place]  04/02/2023 12:16 PM    [facility]

## 2023-03-28 ENCOUNTER — Encounter: Payer: Self-pay | Admitting: Neurology

## 2023-03-28 ENCOUNTER — Ambulatory Visit: Payer: Medicare HMO | Admitting: Neurology

## 2023-03-28 VITALS — BP 146/85 | HR 73 | Ht 62.0 in | Wt 111.4 lb

## 2023-03-28 DIAGNOSIS — E611 Iron deficiency: Secondary | ICD-10-CM

## 2023-03-28 DIAGNOSIS — G2581 Restless legs syndrome: Secondary | ICD-10-CM | POA: Diagnosis not present

## 2023-03-28 NOTE — Patient Instructions (Addendum)
I saw you today for restless legs. I do not see evidence of other neurologic problems such as neuropathy or parkinson's disease. The likely cause of your restless legs is iron deficiency.  -Continue ferrous sulfate 325 mg daily x 3 months.  Discussed not to take with milk.  Discussed that this can cause constipation and to drink plenty of water.  -Take vitamin C, 100-200 mg with each ferrous sulfate dose, or take the ferrous sulfate with small glass of orange juice (if not diabetic)  -Will recommend rechecking ferritin in about 3 months -Continue requip 3-4 mg at bedtime -Continue klonopin 0.5 mg at bedtime -Continue gabapentin 300 mg four times daily  You may be able to lower doses of requip or klonopin in the future if symptoms are well controlled with iron supplementation.  Return to clinic as needed, as was your preference.  The physicians and staff at [facility] are committed to providing excellent care. You may receive a survey requesting feedback about your experience at our office. We strive to receive "very good" responses to the survey questions. If you feel that your experience would prevent you from giving the office a "very good " response, please contact our office to try to remedy the situation. We may be reached at [phone]. Thank you for taking the time out of your busy day to complete the survey.  Jacquelyne Balint, MD [facility]

## 2023-04-02 ENCOUNTER — Encounter: Payer: Self-pay | Admitting: Physician Assistant

## 2023-04-02 ENCOUNTER — Ambulatory Visit: Payer: Medicare HMO | Attending: Physician Assistant | Admitting: Physician Assistant

## 2023-04-02 ENCOUNTER — Other Ambulatory Visit: Payer: Self-pay | Admitting: Internal Medicine

## 2023-04-02 VITALS — BP 124/82 | HR 63 | Ht 62.0 in | Wt 109.4 lb

## 2023-04-02 DIAGNOSIS — I10 Essential (primary) hypertension: Secondary | ICD-10-CM | POA: Diagnosis not present

## 2023-04-02 DIAGNOSIS — E785 Hyperlipidemia, unspecified: Secondary | ICD-10-CM | POA: Diagnosis not present

## 2023-04-02 DIAGNOSIS — I2510 Atherosclerotic heart disease of native coronary artery without angina pectoris: Secondary | ICD-10-CM

## 2023-04-02 NOTE — Patient Instructions (Signed)
Medication Instructions:  The current medical regimen is effective;  continue present plan and medications as directed. Please refer to the Current Medication list given to you today.  *If you need a refill on your cardiac medications before your next appointment, please call your pharmacy*  Lab Work: NONE ordered at this time of appointment   If you have labs (blood work) drawn today and your tests are completely normal, you will receive your results only by:  MyChart Message (if you have MyChart) OR A paper copy in the mail If you have any lab test that is abnormal or we need to change your treatment, we will call you to review the results.  Follow-Up: At [facility], you and your health needs are our priority.  As part of our continuing mission to provide you with exceptional heart care, we have created designated Provider Care Teams.  These Care Teams include your primary Cardiologist (physician) and Advanced Practice Providers (APPs -  Physician Assistants and Nurse Practitioners) who all work together to provide you with the care you need, when you need it.  Your next appointment:   12 month(s)  Provider:   Thurmon Fair, MD     Other Instructions

## 2023-04-04 ENCOUNTER — Other Ambulatory Visit: Payer: Self-pay | Admitting: Internal Medicine

## 2023-04-04 MED ORDER — TRAMADOL HCL 50 MG PO TABS: ORAL_TABLET | ORAL | 0 refills | Status: DC

## 2023-04-08 ENCOUNTER — Encounter: Payer: Self-pay | Admitting: Internal Medicine

## 2023-04-08 NOTE — Patient Instructions (Addendum)
      Blood work was ordered.   The lab is on the first floor.    Medications changes include :   none     Return in about 6 months (around 10/10/2023) for Physical Exam.

## 2023-04-08 NOTE — Progress Notes (Unsigned)
Subjective:    Patient ID: Crystal Barnes, female    DOB: September 30, 1951, 72 y.o.   MRN: [id number]     HPI Valor is here for follow up of her chronic medical problems.  Overall doing well-no major concerns.  Restless leg persists, but fairly tolerable.  Medications and allergies reviewed with patient and updated if appropriate.  Current Outpatient Medications on File Prior to Visit  Medication Sig Dispense Refill   aspirin EC 81 MG tablet Take 1 tablet (81 mg total) by mouth daily. 30 tablet    Calcium-Magnesium-Vitamin D (CALCIUM 1200+D3 PO) Take 1 tablet by mouth daily.     clonazePAM (KLONOPIN) 0.5 MG tablet Take 1 tablet (0.5 mg total) by mouth at bedtime. 30 tablet 2   Coenzyme Q10 (CO Q 10 PO) Take 1 capsule by mouth every evening.     Evolocumab (REPATHA SURECLICK) 140 MG/ML SOAJ INJECT 140 MG INTO THE SKIN EVERY 14 DAYS 6 mL 0   ferrous sulfate 325 (65 FE) MG EC tablet Take 325 mg by mouth once.     gabapentin (NEURONTIN) 300 MG capsule TAKE ONE CAPSULE BY MOUTH FOUR TIMES A DAY 360 capsule 1   hydrochlorothiazide (HYDRODIURIL) 25 MG tablet Take 1 tablet (25 mg total) by mouth daily. 90 tablet 3   levothyroxine (SYNTHROID) 88 MCG tablet Take 1 tablet (88 mcg total) by mouth daily before breakfast. 90 tablet 3   Multiple Vitamin (MULTIVITAMIN WITH MINERALS) TABS tablet Take 1 tablet by mouth daily.     Polyethyl Glycol-Propyl Glycol (SYSTANE OP) Place 1 drop into both eyes daily as needed (Dry eyes).      rOPINIRole (REQUIP) 1 MG tablet Take 2-3 tablets (2-3 mg total) by mouth at bedtime. 270 tablet 0   rosuvastatin (CRESTOR) 5 MG tablet TAKE ONE TABLET BY MOUTH DAILY 90 tablet 3   traMADol (ULTRAM) 50 MG tablet TAKE 2 TABLETS BY MOUTH EVERY 8 HOURS AS NEEDED 180 tablet 0   No current facility-administered medications on file prior to visit.     Review of Systems  Constitutional:  Negative for fever.  Respiratory:  Negative for cough, shortness of breath and  wheezing.   Cardiovascular:  Negative for chest pain, palpitations and leg swelling.  Skin:        Vitiligo b/l lower legs.   Neurological:  Negative for light-headedness and headaches.       Objective:   Vitals:   04/09/23 1517  BP: 118/70  Pulse: 67  Temp: 98.6 F (37 C)  SpO2: 97%   BP Readings from Last 3 Encounters:  04/09/23 118/70  04/02/23 124/82  03/28/23 (!) 146/85   Wt Readings from Last 3 Encounters:  04/09/23 107 lb (48.5 kg)  04/02/23 109 lb 6.4 oz (49.6 kg)  03/28/23 111 lb 6.4 oz (50.5 kg)   Body mass index is 19.57 kg/m.    Physical Exam Constitutional:      General: She is not in acute distress.    Appearance: Normal appearance.  HENT:     Head: Normocephalic and atraumatic.  Eyes:     Conjunctiva/sclera: Conjunctivae normal.  Cardiovascular:     Rate and Rhythm: Normal rate and regular rhythm.     Heart sounds: Normal heart sounds.  Pulmonary:     Effort: Pulmonary effort is normal. No respiratory distress.     Breath sounds: Normal breath sounds. No wheezing.  Musculoskeletal:     Cervical back: Neck supple.  Right lower leg: No edema.     Left lower leg: No edema.  Lymphadenopathy:     Cervical: No cervical adenopathy.  Skin:    General: Skin is warm and dry.     Findings: No rash.     Comments: Vitiligo bilateral lower legs  Neurological:     Mental Status: She is alert. Mental status is at baseline.  Psychiatric:        Mood and Affect: Mood normal.        Behavior: Behavior normal.        Lab Results  Component Value Date   WBC 6.7 01/04/2023   HGB 14.3 01/04/2023   HCT 41.3 01/04/2023   PLT 298.0 01/04/2023   GLUCOSE 97 09/26/2022   CHOL 149 07/14/2022   TRIG 146.0 07/14/2022   HDL 57.70 07/14/2022   LDLCALC 62 07/14/2022   ALT 18 07/14/2022   AST 22 07/14/2022   NA 137 09/26/2022   K 4.3 09/26/2022   CL 104 09/26/2022   CREATININE 0.61 09/26/2022   BUN 25 (H) 09/26/2022   CO2 28 09/26/2022   TSH 4.36  07/14/2022   HGBA1C 4.8 07/23/2020   MICROALBUR 0.2 07/23/2020     Assessment & Plan:    See Problem List for Assessment and Plan of chronic medical problems.

## 2023-04-09 ENCOUNTER — Ambulatory Visit (INDEPENDENT_AMBULATORY_CARE_PROVIDER_SITE_OTHER): Payer: Medicare HMO | Admitting: Internal Medicine

## 2023-04-09 VITALS — BP 118/70 | HR 67 | Temp 98.6°F | Ht 62.0 in | Wt 107.0 lb

## 2023-04-09 DIAGNOSIS — I10 Essential (primary) hypertension: Secondary | ICD-10-CM

## 2023-04-09 DIAGNOSIS — I2510 Atherosclerotic heart disease of native coronary artery without angina pectoris: Secondary | ICD-10-CM | POA: Diagnosis not present

## 2023-04-09 DIAGNOSIS — E782 Mixed hyperlipidemia: Secondary | ICD-10-CM | POA: Diagnosis not present

## 2023-04-09 DIAGNOSIS — G2581 Restless legs syndrome: Secondary | ICD-10-CM

## 2023-04-09 DIAGNOSIS — E611 Iron deficiency: Secondary | ICD-10-CM | POA: Diagnosis not present

## 2023-04-09 DIAGNOSIS — E039 Hypothyroidism, unspecified: Secondary | ICD-10-CM

## 2023-04-09 DIAGNOSIS — E875 Hyperkalemia: Secondary | ICD-10-CM | POA: Diagnosis not present

## 2023-04-09 NOTE — Assessment & Plan Note (Signed)
Chronic Has RLS so need to be on the higher side Continue iron OTC daily Check ferritin, iron levels, CBC

## 2023-04-09 NOTE — Assessment & Plan Note (Signed)
Chronic Following with cardiology No symptoms consistent with angina Continue Crestor, Repatha, hydrochlorothiazide, aspirin 81 mg daily 

## 2023-04-09 NOTE — Assessment & Plan Note (Addendum)
Chronic Has gotten worse since her last visit Continue Requip 2-4 mg nightly Since she was here last started clonazepam 0.5 mg at bedtime-this has helped Also gabapentin for other reasons 300 mg 4 times daily Taking iron daily-continue-will check iron levels Did see neurology Discussed that we may need to change medications and rotate medications in order to keep them effective

## 2023-04-09 NOTE — Assessment & Plan Note (Signed)
Chronic Blood pressure is well-controlled Continue hydrochlorothiazide 25 mg daily Cmp, cbc

## 2023-04-09 NOTE — Assessment & Plan Note (Signed)
Chronic Initially thought to be dietary related, but revising her diet did not help We did start her on hydrochlorothiazide 25 mg daily to help compensate Blood pressure well-controlled CMP

## 2023-04-09 NOTE — Assessment & Plan Note (Signed)
Chronic Regular exercise and healthy diet encouraged Check lipid panel  Continue Crestor 5 mg every other day, Repatha 140 mg every 2 weeks 

## 2023-04-09 NOTE — Assessment & Plan Note (Signed)
Chronic  Clinically euthyroid Check tsh and will titrate med dose if needed Currently taking levothyroxine 88 mcg daily 

## 2023-04-11 ENCOUNTER — Encounter: Payer: Self-pay | Admitting: Neurology

## 2023-04-13 ENCOUNTER — Encounter: Payer: Self-pay | Admitting: Internal Medicine

## 2023-04-13 ENCOUNTER — Other Ambulatory Visit: Payer: Self-pay | Admitting: Internal Medicine

## 2023-04-13 DIAGNOSIS — R739 Hyperglycemia, unspecified: Secondary | ICD-10-CM

## 2023-04-13 LAB — COMPREHENSIVE METABOLIC PANEL
ALT: 19 U/L (ref 0–35)
AST: 26 U/L (ref 0–37)
Albumin: 4.3 g/dL (ref 3.5–5.2)
Alkaline Phosphatase: 60 U/L (ref 39–117)
BUN: 23 mg/dL (ref 6–23)
CO2: 26 mEq/L (ref 19–32)
Calcium: 10 mg/dL (ref 8.4–10.5)
Chloride: 101 mEq/L (ref 96–112)
Creatinine, Ser: 0.64 mg/dL (ref 0.40–1.20)
GFR: 89.99 mL/min (ref >60.00)
Glucose, Bld: 109 mg/dL — ABNORMAL HIGH (ref 70–99)
Potassium: 4.8 mEq/L (ref 3.5–5.1)
Sodium: 133 mEq/L — ABNORMAL LOW (ref 135–145)
Total Bilirubin: 0.4 mg/dL (ref 0.2–1.2)
Total Protein: 6.6 g/dL (ref 6.0–8.3)

## 2023-04-13 LAB — CBC WITH DIFFERENTIAL/PLATELET
Basophils Absolute: 0.1 10*3/uL (ref 0.0–0.1)
Basophils Relative: 1 % (ref 0.0–3.0)
Eosinophils Absolute: 0.4 10*3/uL (ref 0.0–0.7)
Eosinophils Relative: 7.2 % — ABNORMAL HIGH (ref 0.0–5.0)
HCT: 42.4 % (ref 36.0–46.0)
Hemoglobin: 14.2 g/dL (ref 12.0–15.0)
Lymphocytes Relative: 36.8 % (ref 12.0–46.0)
Lymphs Abs: 2.1 10*3/uL (ref 0.7–4.0)
MCHC: 33.5 g/dL (ref 30.0–36.0)
MCV: 90.9 fl (ref 78.0–100.0)
Monocytes Absolute: 0.5 10*3/uL (ref 0.1–1.0)
Monocytes Relative: 8.9 % (ref 3.0–12.0)
Neutro Abs: 2.6 10*3/uL (ref 1.4–7.7)
Neutrophils Relative %: 46.1 % (ref 43.0–77.0)
Platelets: 308 10*3/uL (ref 150.0–400.0)
RBC: 4.66 Mil/uL (ref 3.87–5.11)
RDW: 13 % (ref 11.5–15.5)
WBC: 5.6 10*3/uL (ref 4.0–10.5)

## 2023-04-13 LAB — LIPID PANEL
Cholesterol: 149 mg/dL (ref 0–200)
HDL: 61.3 mg/dL (ref >39.00)
LDL Cholesterol: 63 mg/dL (ref 0–99)
NonHDL: 88.04
Total CHOL/HDL Ratio: 2
Triglycerides: 124 mg/dL (ref 0.0–149.0)
VLDL: 24.8 mg/dL (ref 0.0–40.0)

## 2023-04-13 LAB — IBC PANEL
Iron: 74 ug/dL (ref 42–145)
Saturation Ratios: 21.7 % (ref 20.0–50.0)
TIBC: 341.6 ug/dL (ref 250.0–450.0)
Transferrin: 244 mg/dL (ref 212.0–360.0)

## 2023-04-13 LAB — FERRITIN: Ferritin: 61.6 ng/mL (ref 10.0–291.0)

## 2023-04-13 LAB — TSH: TSH: 3.04 u[IU]/mL (ref 0.35–5.50)

## 2023-04-13 NOTE — Telephone Encounter (Signed)
Just filled

## 2023-04-18 ENCOUNTER — Other Ambulatory Visit (INDEPENDENT_AMBULATORY_CARE_PROVIDER_SITE_OTHER): Payer: Medicare HMO

## 2023-04-18 DIAGNOSIS — R739 Hyperglycemia, unspecified: Secondary | ICD-10-CM

## 2023-04-18 LAB — HEMOGLOBIN A1C: Hgb A1c MFr Bld: 5.3 % (ref 4.6–6.5)

## 2023-04-20 ENCOUNTER — Encounter: Payer: Self-pay | Admitting: Internal Medicine

## 2023-04-21 ENCOUNTER — Other Ambulatory Visit: Payer: Self-pay | Admitting: Cardiovascular Disease

## 2023-04-21 DIAGNOSIS — I2510 Atherosclerotic heart disease of native coronary artery without angina pectoris: Secondary | ICD-10-CM

## 2023-04-21 DIAGNOSIS — E7800 Pure hypercholesterolemia, unspecified: Secondary | ICD-10-CM

## 2023-05-04 ENCOUNTER — Other Ambulatory Visit: Payer: Self-pay | Admitting: Internal Medicine

## 2023-05-04 MED ORDER — TRAMADOL HCL 50 MG PO TABS: ORAL_TABLET | ORAL | 0 refills | Status: DC

## 2023-05-15 ENCOUNTER — Encounter: Payer: Self-pay | Admitting: Internal Medicine

## 2023-05-16 ENCOUNTER — Other Ambulatory Visit: Payer: Self-pay | Admitting: Internal Medicine

## 2023-05-17 MED ORDER — ROPINIROLE HCL 1 MG PO TABS: 3.0000 mg | ORAL_TABLET | Freq: Every day | ORAL | 2 refills | Status: DC

## 2023-06-04 ENCOUNTER — Encounter: Payer: Self-pay | Admitting: Internal Medicine

## 2023-06-04 MED ORDER — TRAMADOL HCL 50 MG PO TABS: ORAL_TABLET | ORAL | 0 refills | Status: DC

## 2023-07-03 ENCOUNTER — Encounter: Payer: Self-pay | Admitting: Internal Medicine

## 2023-07-03 DIAGNOSIS — L80 Vitiligo: Secondary | ICD-10-CM

## 2023-07-05 ENCOUNTER — Other Ambulatory Visit: Payer: Self-pay | Admitting: Internal Medicine

## 2023-07-06 MED ORDER — TRAMADOL HCL 50 MG PO TABS: ORAL_TABLET | ORAL | 0 refills | Status: DC

## 2023-07-23 ENCOUNTER — Ambulatory Visit (INDEPENDENT_AMBULATORY_CARE_PROVIDER_SITE_OTHER): Payer: Medicare Other

## 2023-07-23 VITALS — Ht 62.0 in | Wt 106.0 lb

## 2023-07-23 DIAGNOSIS — Z0000 Encounter for general adult medical examination without abnormal findings: Secondary | ICD-10-CM

## 2023-07-23 NOTE — Progress Notes (Addendum)
Subjective:   Crystal Barnes is a 72 y.o. female who presents for Medicare Annual (Subsequent) preventive examination.  Visit Complete: Virtual I connected with  Crystal Barnes on 07/23/23 by a audio enabled telemedicine application and verified that I am speaking with the correct person using two identifiers.  Patient Location: Home  Provider Location: Office/Clinic  I discussed the limitations of evaluation and management by telemedicine. The patient expressed understanding and agreed to proceed.  Vital Signs: Because this visit was a virtual/telehealth visit, some criteria may be missing or patient reported. Any vitals not documented were not able to be obtained and vitals that have been documented are patient reported.  Patient Medicare AWV questionnaire was completed by the patient on 07/22/2023; I have confirmed that all information answered by patient is correct and no changes since this date.  Cardiac Risk Factors include: advanced age (>51men, >70 women);dyslipidemia;hypertension     Objective:    Today's Vitals   07/23/23 1049 07/23/23 1101  Weight: 106 lb (48.1 kg)   Height: 5\' 2"  (1.575 m)   PainSc: 2  2   PainLoc: Groin    Body mass index is 19.39 kg/m.     07/23/2023   10:51 AM 07/20/2022   10:20 AM 07/11/2021    4:20 PM 04/27/2021   10:14 AM 08/04/2020    7:41 AM 03/29/2020    3:04 PM 11/24/2019    9:46 AM  Advanced Directives  Does Patient Have a Medical Advance Directive? Yes No Yes Yes Yes Yes Yes  Type of Estate agent of [place];Living will  Living will;Healthcare Power of [employer] Power of [place];Living will Healthcare Power of [place];Living will Healthcare Power of [place];Living will Living will  Does patient want to make changes to medical advance directive?   No - Patient declined No - Patient declined     Copy of Healthcare Power of Attorney in Chart? Yes - validated most recent copy scanned in chart (See  row information)  No - copy requested No - copy requested No - copy requested No - copy requested   Would patient like information on creating a medical advance directive?  Yes (MAU/Ambulatory/Procedural Areas - Information given)         Current Medications (verified) Outpatient Encounter Medications as of 07/23/2023  Medication Sig   aspirin EC 81 MG tablet Take 1 tablet (81 mg total) by mouth daily.   Calcium-Magnesium-Vitamin D (CALCIUM 1200+D3 PO) Take 1 tablet by mouth daily.   clonazePAM (KLONOPIN) 0.5 MG tablet TAKE 1 TABLET BY MOUTH AT BEDTIME   Coenzyme Q10 (CO Q 10 PO) Take 1 capsule by mouth every evening.   Evolocumab (REPATHA SURECLICK) 140 MG/ML SOAJ INJECT 140 MG INTO THE SKIN EVERY 14 DAYS   ferrous sulfate 325 (65 FE) MG EC tablet Take 325 mg by mouth once.   gabapentin (NEURONTIN) 300 MG capsule TAKE ONE CAPSULE BY MOUTH FOUR TIMES A DAY   hydrochlorothiazide (HYDRODIURIL) 25 MG tablet TAKE 1 TABLET BY MOUTH DAILY   levothyroxine (SYNTHROID) 88 MCG tablet Take 1 tablet (88 mcg total) by mouth daily before breakfast.   Multiple Vitamin (MULTIVITAMIN WITH MINERALS) TABS tablet Take 1 tablet by mouth daily.   Polyethyl Glycol-Propyl Glycol (SYSTANE OP) Place 1 drop into both eyes daily as needed (Dry eyes).    rOPINIRole (REQUIP) 1 MG tablet Take 3-4 tablets (3-4 mg total) by mouth at bedtime.   rosuvastatin (CRESTOR) 5 MG tablet TAKE ONE TABLET BY MOUTH  DAILY   traMADol (ULTRAM) 50 MG tablet TAKE 2 TABLETS BY MOUTH EVERY 8 HOURS AS NEEDED   No facility-administered encounter medications on file as of 07/23/2023.    Allergies (verified) Benadryl [diphenhydramine]   History: Past Medical History:  Diagnosis Date   Arthritis    CAD (coronary artery disease)    Cataract    bilateral-removed   Hyperlipidemia    Hypertension    Hypothyroidism    Myocardial infarction [facility])    Thyroid disease    Past Surgical History:  Procedure Laterality Date   ABDOMINAL  HYSTERECTOMY  2008   CATARACT EXTRACTION Bilateral    COLONOSCOPY     CORONARY STENT PLACEMENT  2014   ENDOSCOPIC MUCOSAL RESECTION N/A 11/24/2019   Procedure: ENDOSCOPIC MUCOSAL RESECTION;  Surgeon: Lemar Lofty., MD;  Location: [facility] ENDOSCOPY;  Service: Gastroenterology;  Laterality: N/A;   ENDOSCOPIC MUCOSAL RESECTION  08/04/2020   Procedure: ENDOSCOPIC MUCOSAL RESECTION;  Surgeon: Meridee Score Netty Starring., MD;  Location: [facility] ENDOSCOPY;  Service: Gastroenterology;;   Wenda Low SIGMOIDOSCOPY N/A 11/24/2019   Procedure: Arnell Sieving;  Surgeon: Lemar Lofty., MD;  Location: [facility] ENDOSCOPY;  Service: Gastroenterology;  Laterality: N/A;   FLEXIBLE SIGMOIDOSCOPY N/A 08/04/2020   Procedure: FLEXIBLE SIGMOIDOSCOPY;  Surgeon: Meridee Score Netty Starring., MD;  Location: [facility] ENDOSCOPY;  Service: Gastroenterology;  Laterality: N/A;   HEMOSTASIS CLIP PLACEMENT  11/24/2019   Procedure: HEMOSTASIS CLIP PLACEMENT;  Surgeon: Lemar Lofty., MD;  Location: [facility] ENDOSCOPY;  Service: Gastroenterology;;   HEMOSTASIS CLIP PLACEMENT  08/04/2020   Procedure: HEMOSTASIS CLIP PLACEMENT;  Surgeon: Lemar Lofty., MD;  Location: [facility] ENDOSCOPY;  Service: Gastroenterology;;   POLYPECTOMY     SUBMUCOSAL LIFTING INJECTION  11/24/2019   Procedure: SUBMUCOSAL LIFTING INJECTION;  Surgeon: Lemar Lofty., MD;  Location: [facility] ENDOSCOPY;  Service: Gastroenterology;;   SUBMUCOSAL LIFTING INJECTION  08/04/2020   Procedure: SUBMUCOSAL LIFTING INJECTION;  Surgeon: Lemar Lofty., MD;  Location: [facility] ENDOSCOPY;  Service: Gastroenterology;;   TONSILLECTOMY     Family History  Problem Relation Age of Onset   Colon polyps Mother    Esophageal cancer Father    Colon cancer Neg Hx    Rectal cancer Neg Hx    Stomach cancer Neg Hx    Inflammatory bowel disease Neg Hx    Liver disease Neg Hx    Pancreatic cancer Neg Hx    Social History   Socioeconomic History   Marital status:  Single    Spouse name: Not on file   Number of children: Not on file   Years of education: Not on file   Highest education level: Doctorate  Occupational History   Occupation: Retired     Comment: about a year and half ago  Tobacco Use   Smoking status: Former    Types: Cigarettes   Smokeless tobacco: Never  Vaping Use   Vaping status: Never Used  Substance and Sexual Activity   Alcohol use: Not Currently   Drug use: Never   Sexual activity: Not Currently  Other Topics Concern   Not on file  Social History Narrative   Not on file   Social Determinants of Health   Financial Resource Strain: Low Risk  (07/23/2023)   Overall Financial Resource Strain (CARDIA)    Difficulty of Paying Living Expenses: Not hard at all  Food Insecurity: No Food Insecurity (07/23/2023)   Hunger Vital Sign    Worried About Running Out of Food in the Last Year: Never true  Ran Out of Food in the Last Year: Never true  Transportation Needs: No Transportation Needs (07/23/2023)   PRAPARE - Administrator, Civil Service (Medical): No    Lack of Transportation (Non-Medical): No  Physical Activity: Sufficiently Active (07/23/2023)   Exercise Vital Sign    Days of Exercise per Week: 6 days    Minutes of Exercise per Session: 60 min  Stress: No Stress Concern Present (07/23/2023)   [employer] of Occupational Health - Occupational Stress Questionnaire    Feeling of Stress : Not at all  Social Connections: Unknown (07/23/2023)   Social Connection and Isolation Panel [NHANES]    Frequency of Communication with Friends and Family: More than three times a week    Frequency of Social Gatherings with Friends and Family: Twice a week    Attends Religious Services: Patient unable to answer    Active Member of Clubs or Organizations: Yes    Attends Engineer, structural: More than 4 times per year    Marital Status: Never married    Tobacco Counseling Counseling given: Not  Answered   Clinical Intake:  Pre-visit preparation completed: Yes  Pain : 0-10 Pain Score: 2  Pain Location: Pelvis     BMI - recorded: 19.39 Nutritional Status: BMI of 19-24  Normal Nutritional Risks: None Diabetes: No  How often do you need to have someone help you when you read instructions, pamphlets, or other written materials from your doctor or pharmacy?: 1 - Never What is the last grade level you completed in school?: COLLEGE  Interpreter Needed?: No  Information entered by :: Susie Cassette, LPN.   Activities of Daily Living    07/23/2023   10:55 AM 07/22/2023    2:29 PM  In your present state of health, do you have any difficulty performing the following activities:  Hearing? 0 0  Vision? 0 0  Difficulty concentrating or making decisions? 0 0  Walking or climbing stairs? 0 0  Dressing or bathing? 0 0  Doing errands, shopping? 0 0  Preparing Food and eating ? N N  Using the Toilet? N N  In the past six months, have you accidently leaked urine? Y Y  Do you have problems with loss of bowel control? N N  Managing your Medications? N N  Managing your Finances? N N  Housekeeping or managing your Housekeeping? N N    Patient Care Team: Pincus Sanes, MD as PCP - General (Internal Medicine) Thurmon Fair, MD as PCP - Cardiology (Cardiology) Nelson Chimes, MD as Consulting Physician (Ophthalmology) [facility], Roe Ruther[place], [place] as Physician Assistant (Cardiology)  Indicate any recent Medical Services you may have received from other than [facility] providers in the past year (date may be approximate).     Assessment:   This is a routine wellness examination for Crystal Barnes.  Hearing/Vision screen Hearing Screening - Comments:: Patient denied any hearing difficulty.   No hearing aids.  Vision Screening - Comments:: Patient does not wear any corrective lenses/contacts.   Cataracts removed. Eye exam done by: [facility]     Goals Addressed              This Visit's Progress    To stay healthy and physically active.        Depression Screen    07/23/2023   10:53 AM 07/20/2022   10:25 AM 03/15/2022    3:04 PM 07/11/2021    4:22 PM 12/23/2020   11:37 AM 03/29/2020  2:59 PM 06/23/2019    3:21 PM  PHQ 2/9 Scores  PHQ - 2 Score 0 0 0 0 0 0 0  PHQ- 9 Score 0  0  2      Fall Risk    07/23/2023   10:59 AM 07/22/2023    2:29 PM 07/20/2022   10:21 AM 03/15/2022    3:03 PM 07/11/2021    4:22 PM  Fall Risk   Falls in the past year? 0 0 0 0 0  Number falls in past yr: 0 0 0 0 0  Injury with Fall? 0 0 0 0 0  Risk for fall due to : No Fall Risks  No Fall Risks No Fall Risks No Fall Risks  Follow up Falls prevention discussed  Falls prevention discussed Falls evaluation completed Falls evaluation completed    MEDICARE RISK AT HOME: Medicare Risk at Home Any stairs in or around the home?: No If so, are there any without handrails?: No Home free of loose throw rugs in walkways, pet beds, electrical cords, etc?: Yes Adequate lighting in your home to reduce risk of falls?: Yes Life alert?: No Use of a cane, walker or w/c?: No Grab bars in the bathroom?: No Shower chair or bench in shower?: No Elevated toilet seat or a handicapped toilet?: No  TIMED UP AND GO:  Was the test performed?  No    Cognitive Function:    07/23/2023   10:58 AM  MMSE - Mini Mental State Exam  Not completed: Unable to complete        07/23/2023   10:55 AM 07/20/2022   10:31 AM 03/29/2020    3:12 PM  6CIT Screen  What Year? 0 points 0 points 0 points  What month? 0 points 0 points 0 points  What time? 0 points 0 points 0 points  Count back from 20 0 points 0 points 0 points  Months in reverse 0 points 0 points 0 points  Repeat phrase 0 points 0 points 0 points  Total Score 0 points 0 points 0 points    Immunizations Immunization History  Administered Date(s) Administered   Fluad Quad(high Dose 65+) 07/13/2021, 07/06/2022   Influenza, High  Dose Seasonal PF 06/21/2019, 07/27/2020   Influenza-Unspecified 08/08/2018, 06/21/2019, 06/25/2023   Moderna Sars-Covid-2 Vaccination 11/11/2019, 12/02/2019, 08/07/2020   Pfizer(Comirnaty)Fall Seasonal Vaccine 12 years and older 06/14/2023   Pneumococcal Conjugate-13 08/08/2018   Pneumococcal Polysaccharide-23 06/25/2019   Tdap 07/28/2020    TDAP status: Up to date  Flu Vaccine status: Up to date  Pneumococcal vaccine status: Up to date  Covid-19 vaccine status: Completed vaccines  Qualifies for Shingles Vaccine? Yes   Zostavax completed No   Shingrix Completed?: No.    Education has been provided regarding the importance of this vaccine. Patient has been advised to call insurance company to determine out of pocket expense if they have not yet received this vaccine. Advised may also receive vaccine at local pharmacy or Health Dept. Verbalized acceptance and understanding.  Screening Tests Health Maintenance  Topic Date Due   COVID-19 Vaccine (5 - 2023-24 season) 10/14/2023   Medicare Annual Wellness (AWV)  07/22/2024   DEXA SCAN  07/24/2024   MAMMOGRAM  12/17/2024   Colonoscopy  11/09/2026   DTaP/Tdap/Td (2 - Td or Tdap) 07/28/2030   Pneumonia Vaccine 53+ Years old  Completed   INFLUENZA VACCINE  Completed   Hepatitis C Screening  Completed   HPV VACCINES  Aged Out   Zoster Vaccines-  Shingrix  Discontinued    Health Maintenance  There are no preventive care reminders to display for this patient.   Colorectal cancer screening: Type of screening: Colonoscopy. Completed 11/09/2021. Repeat every 5 years  Mammogram status: Completed 12/18/2022. Repeat every year  Bone Density status: Completed 07/24/2022. Results reflect: Bone density results: OSTEOPOROSIS. Repeat every 2 years.  Lung Cancer Screening: (Low Dose CT Chest recommended if Age 90-80 years, 20 pack-year currently smoking OR have quit w/in 15years.) does not qualify.   Lung Cancer Screening Referral:  no  Additional Screening:  Hepatitis C Screening: does qualify; Completed 07/14/2019  Vision Screening: Recommended annual ophthalmology exams for early detection of glaucoma and other disorders of the eye. Is the patient up to date with their annual eye exam?  Yes  Who is the provider or what is the name of the office in which the patient attends annual eye exams? [facility] If pt is not established with a provider, would they like to be referred to a provider to establish care? No .   Dental Screening: Recommended annual dental exams for proper oral hygiene  Diabetic Foot Exam: N/A  Community Resource Referral / Chronic Care Management: CRR required this visit?  No   CCM required this visit?  No     Plan:     I have personally reviewed and noted the following in the patient's chart:   Medical and social history Use of alcohol, tobacco or illicit drugs  Current medications and supplements including opioid prescriptions. Patient is not currently taking opioid prescriptions. Functional ability and status Nutritional status Physical activity Advanced directives List of other physicians Hospitalizations, surgeries, and ER visits in previous 12 months Vitals Screenings to include cognitive, depression, and falls Referrals and appointments  In addition, I have reviewed and discussed with patient certain preventive protocols, quality metrics, and best practice recommendations. A written personalized care plan for preventive services as well as general preventive health recommendations were provided to patient.     Mickeal Needy, LPN   16/07/[id number]   After Visit Summary: (MyChart) Due to this being a telephonic visit, the after visit summary with patients personalized plan was offered to patient via MyChart   Nurse Notes: Normal cognitive status assessed by direct observation via telephone conversation by this Nurse Health Advisor. No abnormalities found.

## 2023-07-23 NOTE — Patient Instructions (Signed)
Crystal Barnes , Thank you for taking time to come for your Medicare Wellness Visit. I appreciate your ongoing commitment to your health goals. Please review the following plan we discussed and let me know if I can assist you in the future.   Referrals/Orders/Follow-Ups/Clinician Recommendations: No  This is a list of the screening recommended for you and due dates:  Health Maintenance  Topic Date Due   COVID-19 Vaccine (5 - 2023-24 season) 10/14/2023   Medicare Annual Wellness Visit  07/22/2024   DEXA scan (bone density measurement)  07/24/2024   Mammogram  12/17/2024   Colon Cancer Screening  11/09/2026   DTaP/Tdap/Td vaccine (2 - Td or Tdap) 07/28/2030   Pneumonia Vaccine  Completed   Flu Shot  Completed   Hepatitis C Screening  Completed   HPV Vaccine  Aged Out   Zoster (Shingles) Vaccine  Discontinued    Advanced directives: (In Chart) A copy of your advanced directives are scanned into your chart should your provider ever need it.  Next Medicare Annual Wellness Visit scheduled for next year: Yes

## 2023-08-01 ENCOUNTER — Encounter: Payer: Self-pay | Admitting: Internal Medicine

## 2023-08-03 ENCOUNTER — Other Ambulatory Visit: Payer: Self-pay | Admitting: Internal Medicine

## 2023-08-03 MED ORDER — TRAMADOL HCL 50 MG PO TABS: ORAL_TABLET | ORAL | 0 refills | Status: DC

## 2023-08-05 ENCOUNTER — Encounter: Payer: Self-pay | Admitting: Internal Medicine

## 2023-08-05 NOTE — Patient Instructions (Addendum)
      Blood work was ordered.   The lab is on the first floor.    Medications changes include :       A referral was ordered and someone will call you to schedule an appointment.     Return in about 6 months (around 02/04/2024) for Physical Exam.

## 2023-08-05 NOTE — Progress Notes (Unsigned)
      Subjective:    Patient ID: Crystal Barnes, female    DOB: 12/30/1950, 72 y.o.   MRN: [id number]     HPI Crystal Barnes is here for follow up of her chronic medical problems.  RLS has gotten worse.  ? Augmentation from requip.     Medications and allergies reviewed with patient and updated if appropriate.  Current Outpatient Medications on File Prior to Visit  Medication Sig Dispense Refill  . aspirin EC 81 MG tablet Take 1 tablet (81 mg total) by mouth daily. 30 tablet   . Calcium-Magnesium-Vitamin D (CALCIUM 1200+D3 PO) Take 1 tablet by mouth daily.    . Coenzyme Q10 (CO Q 10 PO) Take 1 capsule by mouth every evening.    . Evolocumab (REPATHA SURECLICK) 140 MG/ML SOAJ INJECT 140 MG INTO THE SKIN EVERY 14 DAYS 6 mL 3  . ferrous sulfate 325 (65 FE) MG EC tablet Take 325 mg by mouth once.    . gabapentin (NEURONTIN) 300 MG capsule TAKE ONE CAPSULE BY MOUTH FOUR TIMES A DAY 360 capsule 1  . hydrochlorothiazide (HYDRODIURIL) 25 MG tablet TAKE 1 TABLET BY MOUTH DAILY 90 tablet 3  . levothyroxine (SYNTHROID) 88 MCG tablet Take 1 tablet (88 mcg total) by mouth daily before breakfast. 90 tablet 3  . Multiple Vitamin (MULTIVITAMIN WITH MINERALS) TABS tablet Take 1 tablet by mouth daily.    Bertram Gala Glycol-Propyl Glycol (SYSTANE OP) Place 1 drop into both eyes daily as needed (Dry eyes).     [name] rOPINIRole (REQUIP) 1 MG tablet Take 3-4 tablets (3-4 mg total) by mouth at bedtime. 360 tablet 2  . rosuvastatin (CRESTOR) 5 MG tablet TAKE ONE TABLET BY MOUTH DAILY 90 tablet 3  . traMADol (ULTRAM) 50 MG tablet TAKE 2 TABLETS BY MOUTH EVERY 8 HOURS AS NEEDED 180 tablet 0   No current facility-administered medications on file prior to visit.     Review of Systems     Objective:  There were no vitals filed for this visit. BP Readings from Last 3 Encounters:  08/07/23 126/78  04/09/23 118/70  04/02/23 124/82   Wt Readings from Last 3 Encounters:  08/07/23 103 lb (46.7 kg)  07/23/23 106  lb (48.1 kg)  04/09/23 107 lb (48.5 kg)   There is no height or weight on file to calculate BMI.    Physical Exam     Lab Results  Component Value Date   WBC 8.7 08/07/2023   HGB 14.7 08/07/2023   HCT 43.4 08/07/2023   PLT 544.0 (H) 08/07/2023   GLUCOSE 102 (H) 08/07/2023   CHOL 140 08/07/2023   TRIG 98.0 08/07/2023   HDL 69.40 08/07/2023   LDLCALC 51 08/07/2023   ALT 26 08/07/2023   AST 30 08/07/2023   NA 137 08/07/2023   K 4.6 08/07/2023   CL 103 08/07/2023   CREATININE 0.57 08/07/2023   BUN 33 (H) 08/07/2023   CO2 26 08/07/2023   TSH 3.20 08/07/2023   HGBA1C 5.3 04/18/2023   MICROALBUR 0.2 07/23/2020     Assessment & Plan:    See Problem List for Assessment and Plan of chronic medical problems.    This encounter was created in error - please disregard.

## 2023-08-06 ENCOUNTER — Encounter: Payer: Medicare Other | Admitting: Internal Medicine

## 2023-08-06 DIAGNOSIS — E039 Hypothyroidism, unspecified: Secondary | ICD-10-CM

## 2023-08-06 DIAGNOSIS — I2510 Atherosclerotic heart disease of native coronary artery without angina pectoris: Secondary | ICD-10-CM

## 2023-08-06 DIAGNOSIS — E782 Mixed hyperlipidemia: Secondary | ICD-10-CM

## 2023-08-06 DIAGNOSIS — G2581 Restless legs syndrome: Secondary | ICD-10-CM

## 2023-08-06 DIAGNOSIS — M85851 Other specified disorders of bone density and structure, right thigh: Secondary | ICD-10-CM

## 2023-08-06 DIAGNOSIS — E611 Iron deficiency: Secondary | ICD-10-CM

## 2023-08-06 DIAGNOSIS — I10 Essential (primary) hypertension: Secondary | ICD-10-CM

## 2023-08-06 NOTE — Assessment & Plan Note (Signed)
Chronic  Clinically euthyroid Check tsh and will titrate med dose if needed Currently taking levothyroxine 88 mcg daily

## 2023-08-06 NOTE — Patient Instructions (Addendum)
      Blood work was ordered.   The lab is on the first floor.    Medications changes include :   Decrease slowly dose of Requip.  Start duloxetine 30 mg daily.      A referral was ordered PT and someone will call you to schedule an appointment.     Return in about 6 months (around 02/05/2024) for Physical Exam.

## 2023-08-06 NOTE — Assessment & Plan Note (Signed)
Chronic Has RLS so need to be on the higher side Continue iron OTC daily Check ferritin, iron levels, CBC

## 2023-08-06 NOTE — Assessment & Plan Note (Signed)
Chronic Regular exercise and healthy diet encouraged Check lipid panel  Continue Crestor 5 mg every other day, Repatha 140 mg every 2 weeks 

## 2023-08-06 NOTE — Progress Notes (Unsigned)
      Subjective:    Patient ID: Crystal Barnes, female    DOB: 12/30/1950, 72 y.o.   MRN: [id number]     HPI Crystal Barnes is here for follow up of her chronic medical problems.  RLS has gotten worse.  ? Augmentation from requip.     Medications and allergies reviewed with patient and updated if appropriate.  Current Outpatient Medications on File Prior to Visit  Medication Sig Dispense Refill  . aspirin EC 81 MG tablet Take 1 tablet (81 mg total) by mouth daily. 30 tablet   . Calcium-Magnesium-Vitamin D (CALCIUM 1200+D3 PO) Take 1 tablet by mouth daily.    . Coenzyme Q10 (CO Q 10 PO) Take 1 capsule by mouth every evening.    . Evolocumab (REPATHA SURECLICK) 140 MG/ML SOAJ INJECT 140 MG INTO THE SKIN EVERY 14 DAYS 6 mL 3  . ferrous sulfate 325 (65 FE) MG EC tablet Take 325 mg by mouth once.    . gabapentin (NEURONTIN) 300 MG capsule TAKE ONE CAPSULE BY MOUTH FOUR TIMES A DAY 360 capsule 1  . hydrochlorothiazide (HYDRODIURIL) 25 MG tablet TAKE 1 TABLET BY MOUTH DAILY 90 tablet 3  . levothyroxine (SYNTHROID) 88 MCG tablet Take 1 tablet (88 mcg total) by mouth daily before breakfast. 90 tablet 3  . Multiple Vitamin (MULTIVITAMIN WITH MINERALS) TABS tablet Take 1 tablet by mouth daily.    Bertram Gala Glycol-Propyl Glycol (SYSTANE OP) Place 1 drop into both eyes daily as needed (Dry eyes).     [name] rOPINIRole (REQUIP) 1 MG tablet Take 3-4 tablets (3-4 mg total) by mouth at bedtime. 360 tablet 2  . rosuvastatin (CRESTOR) 5 MG tablet TAKE ONE TABLET BY MOUTH DAILY 90 tablet 3  . traMADol (ULTRAM) 50 MG tablet TAKE 2 TABLETS BY MOUTH EVERY 8 HOURS AS NEEDED 180 tablet 0   No current facility-administered medications on file prior to visit.     Review of Systems     Objective:  There were no vitals filed for this visit. BP Readings from Last 3 Encounters:  08/07/23 126/78  04/09/23 118/70  04/02/23 124/82   Wt Readings from Last 3 Encounters:  08/07/23 103 lb (46.7 kg)  07/23/23 106  lb (48.1 kg)  04/09/23 107 lb (48.5 kg)   There is no height or weight on file to calculate BMI.    Physical Exam     Lab Results  Component Value Date   WBC 8.7 08/07/2023   HGB 14.7 08/07/2023   HCT 43.4 08/07/2023   PLT 544.0 (H) 08/07/2023   GLUCOSE 102 (H) 08/07/2023   CHOL 140 08/07/2023   TRIG 98.0 08/07/2023   HDL 69.40 08/07/2023   LDLCALC 51 08/07/2023   ALT 26 08/07/2023   AST 30 08/07/2023   NA 137 08/07/2023   K 4.6 08/07/2023   CL 103 08/07/2023   CREATININE 0.57 08/07/2023   BUN 33 (H) 08/07/2023   CO2 26 08/07/2023   TSH 3.20 08/07/2023   HGBA1C 5.3 04/18/2023   MICROALBUR 0.2 07/23/2020     Assessment & Plan:    See Problem List for Assessment and Plan of chronic medical problems.    This encounter was created in error - please disregard.

## 2023-08-06 NOTE — Assessment & Plan Note (Signed)
Chronic Blood pressure is well-controlled Continue hydrochlorothiazide 25 mg daily Cmp, cbc

## 2023-08-06 NOTE — Assessment & Plan Note (Signed)
Chronic Following with cardiology No symptoms consistent with angina Continue Crestor, Repatha, hydrochlorothiazide, aspirin 81 mg daily 

## 2023-08-06 NOTE — Assessment & Plan Note (Signed)
Chronic Has gotten worse since her last visit Continue Requip 4 mg nightly Symptoms continue to get worse and I think she is having augmentation from degree Taper slowly off Requip Continue clonazepam 0.5 mg at bedtime Continue gabapentin 300 mg 4 times daily Taking iron daily Following with neurology

## 2023-08-06 NOTE — Assessment & Plan Note (Signed)
Chronic DEXA up-to-date She is walking-continue regular walking Continue vitamin D daily-check vitamin D level

## 2023-08-07 ENCOUNTER — Encounter: Payer: Self-pay | Admitting: Internal Medicine

## 2023-08-07 ENCOUNTER — Ambulatory Visit: Payer: Medicare Other | Admitting: Internal Medicine

## 2023-08-07 ENCOUNTER — Ambulatory Visit (INDEPENDENT_AMBULATORY_CARE_PROVIDER_SITE_OTHER): Payer: Medicare Other | Admitting: Internal Medicine

## 2023-08-07 VITALS — BP 126/78 | HR 70 | Temp 98.0°F | Ht 62.0 in | Wt 103.0 lb

## 2023-08-07 DIAGNOSIS — G2581 Restless legs syndrome: Secondary | ICD-10-CM

## 2023-08-07 DIAGNOSIS — G8929 Other chronic pain: Secondary | ICD-10-CM

## 2023-08-07 DIAGNOSIS — E875 Hyperkalemia: Secondary | ICD-10-CM

## 2023-08-07 DIAGNOSIS — I10 Essential (primary) hypertension: Secondary | ICD-10-CM | POA: Diagnosis not present

## 2023-08-07 DIAGNOSIS — E782 Mixed hyperlipidemia: Secondary | ICD-10-CM | POA: Diagnosis not present

## 2023-08-07 DIAGNOSIS — I2510 Atherosclerotic heart disease of native coronary artery without angina pectoris: Secondary | ICD-10-CM | POA: Diagnosis not present

## 2023-08-07 DIAGNOSIS — E611 Iron deficiency: Secondary | ICD-10-CM | POA: Diagnosis not present

## 2023-08-07 DIAGNOSIS — E039 Hypothyroidism, unspecified: Secondary | ICD-10-CM | POA: Diagnosis not present

## 2023-08-07 DIAGNOSIS — M85852 Other specified disorders of bone density and structure, left thigh: Secondary | ICD-10-CM | POA: Diagnosis not present

## 2023-08-07 DIAGNOSIS — M85851 Other specified disorders of bone density and structure, right thigh: Secondary | ICD-10-CM | POA: Diagnosis not present

## 2023-08-07 DIAGNOSIS — M6289 Other specified disorders of muscle: Secondary | ICD-10-CM

## 2023-08-07 LAB — TSH: TSH: 3.2 u[IU]/mL (ref 0.35–5.50)

## 2023-08-07 LAB — LIPID PANEL
Cholesterol: 140 mg/dL (ref 0–200)
HDL: 69.4 mg/dL (ref >39.00)
LDL Cholesterol: 51 mg/dL (ref 0–99)
NonHDL: 70.55
Total CHOL/HDL Ratio: 2
Triglycerides: 98 mg/dL (ref 0.0–149.0)
VLDL: 19.6 mg/dL (ref 0.0–40.0)

## 2023-08-07 LAB — COMPREHENSIVE METABOLIC PANEL
ALT: 26 U/L (ref 0–35)
AST: 30 U/L (ref 0–37)
Albumin: 4.5 g/dL (ref 3.5–5.2)
Alkaline Phosphatase: 70 U/L (ref 39–117)
BUN: 33 mg/dL — ABNORMAL HIGH (ref 6–23)
CO2: 26 meq/L (ref 19–32)
Calcium: 10.4 mg/dL (ref 8.4–10.5)
Chloride: 103 meq/L (ref 96–112)
Creatinine, Ser: 0.57 mg/dL (ref 0.40–1.20)
GFR: 92.33 mL/min (ref >60.00)
Glucose, Bld: 102 mg/dL — ABNORMAL HIGH (ref 70–99)
Potassium: 4.6 meq/L (ref 3.5–5.1)
Sodium: 137 meq/L (ref 135–145)
Total Bilirubin: 0.4 mg/dL (ref 0.2–1.2)
Total Protein: 7.3 g/dL (ref 6.0–8.3)

## 2023-08-07 LAB — CBC WITH DIFFERENTIAL/PLATELET
Basophils Absolute: 0.1 10*3/uL (ref 0.0–0.1)
Basophils Relative: 1.1 % (ref 0.0–3.0)
Eosinophils Absolute: 0.3 10*3/uL (ref 0.0–0.7)
Eosinophils Relative: 3.1 % (ref 0.0–5.0)
HCT: 43.4 % (ref 36.0–46.0)
Hemoglobin: 14.7 g/dL (ref 12.0–15.0)
Lymphocytes Relative: 30.7 % (ref 12.0–46.0)
Lymphs Abs: 2.7 10*3/uL (ref 0.7–4.0)
MCHC: 33.9 g/dL (ref 30.0–36.0)
MCV: 91.1 fL (ref 78.0–100.0)
Monocytes Absolute: 0.6 10*3/uL (ref 0.1–1.0)
Monocytes Relative: 6.8 % (ref 3.0–12.0)
Neutro Abs: 5.1 10*3/uL (ref 1.4–7.7)
Neutrophils Relative %: 58.3 % (ref 43.0–77.0)
Platelets: 544 10*3/uL — ABNORMAL HIGH (ref 150.0–400.0)
RBC: 4.77 Mil/uL (ref 3.87–5.11)
RDW: 13 % (ref 11.5–15.5)
WBC: 8.7 10*3/uL (ref 4.0–10.5)

## 2023-08-07 LAB — IBC PANEL
Iron: 93 ug/dL (ref 42–145)
Saturation Ratios: 19.5 % — ABNORMAL LOW (ref 20.0–50.0)
TIBC: 477.4 ug/dL — ABNORMAL HIGH (ref 250.0–450.0)
Transferrin: 341 mg/dL (ref 212.0–360.0)

## 2023-08-07 LAB — FERRITIN: Ferritin: 100.4 ng/mL (ref 10.0–291.0)

## 2023-08-07 LAB — VITAMIN D 25 HYDROXY (VIT D DEFICIENCY, FRACTURES): VITD: 81.87 ng/mL (ref 30.00–100.00)

## 2023-08-07 MED ORDER — DULOXETINE HCL 30 MG PO CPEP: 30.0000 mg | ORAL_CAPSULE | Freq: Every day | ORAL | 5 refills | Status: DC

## 2023-08-07 NOTE — Assessment & Plan Note (Signed)
Chronic Regular exercise and healthy diet encouraged Check lipid panel  Continue Crestor 5 mg every other day, Repatha 140 mg every 2 weeks 

## 2023-08-07 NOTE — Assessment & Plan Note (Signed)
Chronic Has RLS so need to be on the higher side Continue iron OTC daily Check ferritin, iron levels, CBC

## 2023-08-07 NOTE — Assessment & Plan Note (Signed)
Chronic DEXA up-to-date She is walking-continue regular walking Continue vitamin D daily-check vitamin D level

## 2023-08-07 NOTE — Assessment & Plan Note (Addendum)
Chronic Has gotten worse since her last visit Currently taking Requip 4 mg/ day -1 mg at noon and 3 mg at night Symptoms continue to get worse and I think she is having augmentation from requip Taper slowly off Requip Continue clonazepam 0.5 mg at bedtime Continue gabapentin 300 mg 3 times daily Taking iron daily-she may try to increase her dose which I think is acceptable Start duloxetine 30 mg in the evening or at nighttime to see if that helps Doing yoga which I think will be helpful Will refer back to pelvic physical therapy-I wonder if this will help with some of her chronic pelvic pain and some of this is a nerve pain if it is coming from some pelvic floor dysfunction Following with neurology

## 2023-08-07 NOTE — Assessment & Plan Note (Signed)
Chronic S/p stent in 2015 Following with cardiology No symptoms consistent with angina Continue Crestor, Repatha, hydrochlorothiazide, aspirin 81 mg daily

## 2023-08-07 NOTE — Assessment & Plan Note (Signed)
Chronic Blood pressure is well-controlled Continue hydrochlorothiazide 25 mg daily Cmp, cbc

## 2023-08-07 NOTE — Assessment & Plan Note (Signed)
Chronic  Clinically euthyroid Check tsh and will titrate med dose if needed Currently taking levothyroxine 88 mcg daily

## 2023-08-07 NOTE — Assessment & Plan Note (Signed)
Chronic Controlled, stable Continue gabapentin 300 mg QID, tramadol 50 mg TID ?  Related to her RLS symptoms Referral back to pelvic PT

## 2023-08-07 NOTE — Assessment & Plan Note (Signed)
Chronic Initially thought to be dietary related, but revising her diet did not help Continue hydrochlorothiazide 25 mg daily  Blood pressure well-controlled CMP

## 2023-08-08 ENCOUNTER — Encounter: Payer: Self-pay | Admitting: Internal Medicine

## 2023-08-08 MED ORDER — CLONAZEPAM 0.5 MG PO TABS: 0.5000 mg | ORAL_TABLET | Freq: Every day | ORAL | 5 refills | Status: DC

## 2023-08-10 ENCOUNTER — Encounter: Payer: Self-pay | Admitting: Internal Medicine

## 2023-08-13 ENCOUNTER — Encounter: Payer: Self-pay | Admitting: Internal Medicine

## 2023-08-13 DIAGNOSIS — G8929 Other chronic pain: Secondary | ICD-10-CM

## 2023-08-24 ENCOUNTER — Encounter: Payer: Self-pay | Admitting: Internal Medicine

## 2023-08-24 DIAGNOSIS — G2581 Restless legs syndrome: Secondary | ICD-10-CM

## 2023-09-04 ENCOUNTER — Other Ambulatory Visit: Payer: Self-pay | Admitting: Internal Medicine

## 2023-09-05 MED ORDER — TRAMADOL HCL 50 MG PO TABS: ORAL_TABLET | ORAL | 0 refills | Status: DC

## 2023-09-17 ENCOUNTER — Encounter: Payer: Self-pay | Admitting: Physical Medicine and Rehabilitation

## 2023-10-04 ENCOUNTER — Other Ambulatory Visit: Payer: Self-pay | Admitting: Internal Medicine

## 2023-10-08 ENCOUNTER — Other Ambulatory Visit: Payer: Self-pay | Admitting: Internal Medicine

## 2023-10-08 DIAGNOSIS — G8929 Other chronic pain: Secondary | ICD-10-CM

## 2023-10-08 NOTE — Telephone Encounter (Signed)
On concurrent opioids and benzos without recent UDS. Ordered UDS patient to come get this done and once drawn route back to me. Also no chronic pain contract on file I highly recommend to schedule visit with PCP to discuss/sign to get on file.

## 2023-10-08 NOTE — Telephone Encounter (Signed)
Sent my-chart to patient regarding drug screen.

## 2023-10-11 NOTE — Telephone Encounter (Signed)
 Last read by Ellis Parents at  4:17 PM on 10/08/2023.

## 2023-10-15 ENCOUNTER — Other Ambulatory Visit: Payer: No Typology Code available for payment source

## 2023-10-15 ENCOUNTER — Encounter: Payer: Self-pay | Admitting: Internal Medicine

## 2023-10-15 DIAGNOSIS — G8929 Other chronic pain: Secondary | ICD-10-CM | POA: Diagnosis not present

## 2023-10-15 MED ORDER — TRAMADOL HCL 50 MG PO TABS: ORAL_TABLET | ORAL | 0 refills | Status: DC

## 2023-10-17 LAB — DRUG MONITOR, COCAINEMETAB, W/CONF, URINE: Cocaine Metabolite: NEGATIVE ng/mL (ref <150)

## 2023-10-17 LAB — DRUG MONITOR, BENZO,W/CONF, URINE: Benzodiazepines: NEGATIVE ng/mL (ref <100)

## 2023-10-17 LAB — DRUG MONITOR, TRAMADOL,QN, URINE
Desmethyltramadol: 2168 ng/mL — ABNORMAL HIGH (ref <100)
Tramadol: 2506 ng/mL — ABNORMAL HIGH (ref <100)

## 2023-10-17 LAB — DRUG MONITOR,BARBITURATE,W/CONF, URINE: Barbiturates: NEGATIVE ng/mL (ref <300)

## 2023-10-17 LAB — PRESCRIBED DRUGS,MEDMATCH(R)

## 2023-10-17 LAB — DRUG MONITOR,AMPHETAMINE,W/CONF, URINE: Amphetamines: NEGATIVE ng/mL (ref <500)

## 2023-10-17 LAB — DM TEMPLATE

## 2023-10-17 LAB — DRUG MONITOR, OXYCODONE,W/CONF, URINE: Oxycodone: NEGATIVE ng/mL (ref <100)

## 2023-10-17 LAB — DRUG MONITOR, OPIATES,W/CONF, URINE: Opiates: NEGATIVE ng/mL (ref <100)

## 2023-10-18 ENCOUNTER — Other Ambulatory Visit: Payer: Self-pay | Admitting: Cardiovascular Disease

## 2023-10-18 ENCOUNTER — Encounter: Payer: Self-pay | Admitting: Internal Medicine

## 2023-10-18 NOTE — Telephone Encounter (Signed)
 Please advise for patient

## 2023-10-30 ENCOUNTER — Other Ambulatory Visit: Payer: Self-pay | Admitting: Internal Medicine

## 2023-11-06 ENCOUNTER — Encounter: Payer: Self-pay | Admitting: Internal Medicine

## 2023-11-06 DIAGNOSIS — G2581 Restless legs syndrome: Secondary | ICD-10-CM

## 2023-11-13 ENCOUNTER — Other Ambulatory Visit: Payer: Self-pay | Admitting: Internal Medicine

## 2023-11-13 ENCOUNTER — Encounter: Payer: No Typology Code available for payment source | Admitting: Physical Medicine and Rehabilitation

## 2023-11-13 MED ORDER — TRAMADOL HCL 50 MG PO TABS: ORAL_TABLET | ORAL | 0 refills | Status: DC

## 2023-11-14 ENCOUNTER — Ambulatory Visit: Payer: Self-pay | Admitting: Physical Therapy

## 2023-11-16 NOTE — Telephone Encounter (Signed)
 Copied from CRM [id number]. Topic: Referral - Question >> Nov 16, 2023  9:56 AM Lizabeth Riggs wrote: Reason for CRM: [facility] did not receive referral. I must have been the incorrect fax number. Please refax to [phone] Attention:  Dr. Andrew Spector

## 2023-11-21 ENCOUNTER — Encounter: Payer: Medicare Other | Admitting: Physical Therapy

## 2023-11-28 ENCOUNTER — Encounter: Payer: Self-pay | Admitting: Internal Medicine

## 2023-11-28 ENCOUNTER — Encounter: Payer: Medicare Other | Admitting: Physical Therapy

## 2023-11-28 NOTE — Progress Notes (Unsigned)
    Subjective:    Patient ID: Crystal Barnes, female    DOB: 1951/04/18, 73 y.o.   MRN: [id number]      HPI Crystal Barnes is here for No chief complaint on file.   Pain - right side of back x 1 week - not improving.  Concern over internal cause - kidney  or appendix.      Medications and allergies reviewed with patient and updated if appropriate.  Current Outpatient Medications on File Prior to Visit  Medication Sig Dispense Refill   aspirin EC 81 MG tablet Take 1 tablet (81 mg total) by mouth daily. 30 tablet    Calcium-Magnesium-Vitamin D (CALCIUM 1200+D3 PO) Take 1 tablet by mouth daily.     clonazePAM (KLONOPIN) 0.5 MG tablet Take 1-2 tablets (0.5-1 mg total) by mouth at bedtime. 60 tablet 5   Coenzyme Q10 (CO Q 10 PO) Take 1 capsule by mouth every evening.     Evolocumab (REPATHA SURECLICK) 140 MG/ML SOAJ INJECT 140 MG INTO THE SKIN EVERY 14 DAYS 6 mL 3   ferrous sulfate 325 (65 FE) MG EC tablet Take 325 mg by mouth once.     gabapentin (NEURONTIN) 300 MG capsule TAKE 1 CAPSULE BY MOUTH 3 TIMES A DAY 270 capsule 0   hydrochlorothiazide (HYDRODIURIL) 25 MG tablet TAKE 1 TABLET BY MOUTH DAILY 90 tablet 3   levothyroxine (SYNTHROID) 88 MCG tablet Take 1 tablet (88 mcg total) by mouth daily before breakfast. 90 tablet 3   Multiple Vitamin (MULTIVITAMIN WITH MINERALS) TABS tablet Take 1 tablet by mouth daily.     Polyethyl Glycol-Propyl Glycol (SYSTANE OP) Place 1 drop into both eyes daily as needed (Dry eyes).      rOPINIRole (REQUIP) 1 MG tablet Take 3-4 tablets (3-4 mg total) by mouth at bedtime. 360 tablet 2   rosuvastatin (CRESTOR) 5 MG tablet TAKE 1 TABLET BY MOUTH DAILY 90 tablet 1   traMADol (ULTRAM) 50 MG tablet TAKE 2 TABLETS BY MOUTH EVERY 8 HOURS AS NEEDED 180 tablet 0   No current facility-administered medications on file prior to visit.    Review of Systems     Objective:  There were no vitals filed for this visit. BP Readings from Last 3 Encounters:  08/07/23  126/78  04/09/23 118/70  04/02/23 124/82   Wt Readings from Last 3 Encounters:  08/07/23 103 lb (46.7 kg)  07/23/23 106 lb (48.1 kg)  04/09/23 107 lb (48.5 kg)   There is no height or weight on file to calculate BMI.    Physical Exam         Assessment & Plan:    See Problem List for Assessment and Plan of chronic medical problems.

## 2023-11-28 NOTE — Patient Instructions (Addendum)
      Blood work was ordered.       Medications changes include :   None    A referral was ordered and someone will call you to schedule an appointment.     Return for 6 month follow up end of April.

## 2023-11-29 ENCOUNTER — Ambulatory Visit: Payer: No Typology Code available for payment source | Admitting: Internal Medicine

## 2023-11-29 VITALS — BP 108/74 | HR 74 | Temp 98.0°F | Ht 62.0 in | Wt 108.0 lb

## 2023-11-29 DIAGNOSIS — M549 Dorsalgia, unspecified: Secondary | ICD-10-CM

## 2023-11-29 MED ORDER — MELOXICAM 15 MG PO TABS: 15.0000 mg | ORAL_TABLET | Freq: Every day | ORAL | 0 refills | Status: DC | PRN

## 2023-11-29 NOTE — Assessment & Plan Note (Signed)
Acute Started a week and a half ago that has not improved Pain is dull, constant and nonradiating No GI or GU symptoms.  No respiratory symptoms.  No rash Likely muscular skeletal Start meloxicam 15 mg daily as needed-advised to take for 1-2 weeks at most and then stop If pain is not resolving she will let me know so that we can evaluate further

## 2023-12-05 ENCOUNTER — Encounter: Payer: Medicare Other | Admitting: Physical Therapy

## 2023-12-07 NOTE — Therapy (Addendum)
 OUTPATIENT PHYSICAL THERAPY FEMALE PELVIC EVALUATION   Patient Name: Crystal Barnes MRN: [id number] DOB:04-05-1951, 73 y.o., female Today's Date: 12/10/2023  END OF SESSION:  PT End of Session - 12/10/23 1227     Visit Number 1    Date for PT Re-Evaluation 03/03/24    Authorization Type [employer] [facility]    Authorization - Visit Number 1    Authorization - Number of Visits 10    PT Start Time 1230    PT Stop Time 1310    PT Time Calculation (min) 40 min    Activity Tolerance Patient tolerated treatment well    Behavior During Therapy WFL for tasks assessed/performed             Past Medical History:  Diagnosis Date   Arthritis    CAD (coronary artery disease)    Cataract    bilateral-removed   Hyperlipidemia    Hypertension    Hypothyroidism    Myocardial infarction [facility])    Thyroid disease    Past Surgical History:  Procedure Laterality Date   ABDOMINAL HYSTERECTOMY  2008   CATARACT EXTRACTION Bilateral    COLONOSCOPY     CORONARY STENT PLACEMENT  2014   ENDOSCOPIC MUCOSAL RESECTION N/A 11/24/2019   Procedure: ENDOSCOPIC MUCOSAL RESECTION;  Surgeon: Lemar Lofty., MD;  Location: [facility] ENDOSCOPY;  Service: Gastroenterology;  Laterality: N/A;   ENDOSCOPIC MUCOSAL RESECTION  08/04/2020   Procedure: ENDOSCOPIC MUCOSAL RESECTION;  Surgeon: Meridee Score Netty Starring., MD;  Location: [facility] ENDOSCOPY;  Service: Gastroenterology;;   Wenda Low SIGMOIDOSCOPY N/A 11/24/2019   Procedure: Arnell Sieving;  Surgeon: Lemar Lofty., MD;  Location[facility] ENDOSCOPY;  Service: Gastroenterology;  Laterality: N/A;   FLEXIBLE SIGMOIDOSCOPY N/A 08/04/2020   Procedure: FLEXIBLE SIGMOIDOSCOPY;  Surgeon: Meridee Score Netty Starring., MD;  Location: [facility] ENDOSCOPY;  Service: Gastroenterology;  Laterality: N/A;   HEMOSTASIS CLIP PLACEMENT  11/24/2019   Procedure: HEMOSTASIS CLIP PLACEMENT;  Surgeon: Meridee Score Netty Starring., MD;  Location: [facility] ENDOSCOPY;  Service: Gastroenterology;;    HEMOSTASIS CLIP PLACEMENT  08/04/2020   Procedure: HEMOSTASIS CLIP PLACEMENT;  Surgeon: Lemar Lofty., MD;  Location: [facility] ENDOSCOPY;  Service: Gastroenterology;;   POLYPECTOMY     SUBMUCOSAL LIFTING INJECTION  11/24/2019   Procedure: SUBMUCOSAL LIFTING INJECTION;  Surgeon: Lemar Lofty., MD;  Location: [facility] ENDOSCOPY;  Service: Gastroenterology;;   SUBMUCOSAL LIFTING INJECTION  08/04/2020   Procedure: SUBMUCOSAL LIFTING INJECTION;  Surgeon: Lemar Lofty., MD;  Location: [facility] ENDOSCOPY;  Service: Gastroenterology;;   TONSILLECTOMY     Patient Active Problem List   Diagnosis Date Noted   Mid back pain on right side 11/29/2023   Iron deficiency 01/07/2023   Hyperkalemia 05/15/2022   History of rectal polypectomy 06/06/2020   Serrated adenoma of colon 06/06/2020   Hx of adenomatous colonic polyps 06/06/2020   Myocardial infarction (HCC)    Arthralgia of both hands 06/26/2019   CAD (coronary artery disease) 02/10/2019   HTN (hypertension) 02/10/2019   Hypothyroidism (acquired) 02/10/2019   Hyperlipidemia, mixed 02/10/2019   Osteopenia 02/10/2019   RLS (restless legs syndrome) 02/10/2019   Chronic pain-pelvic floor dysfunction 02/10/2019    PCP: Pincus Sanes, MD  REFERRING PROVIDER: Pincus Sanes, MD   REFERRING DIAG: M62.89 (ICD-10-CM) - Pelvic floor dysfunction in female   THERAPY DIAG:  Cramp and spasm - Plan: PT plan of care cert/re-cert  Muscle weakness (generalized) - Plan: PT plan of care cert/re-cert  Pelvic pain - Plan: PT plan of care cert/re-cert  Rationale for Evaluation and Treatment: Rehabilitation  ONSET DATE: 2023  SUBJECTIVE:                                                                                                                                                                                           SUBJECTIVE STATEMENT: Still have in the right side of the pelvic floor. I have restless legs. Patient started yoga and  weight lifting and gets a click in the left hip.  Fluid intake:   PAIN:  Are you having pain? Yes NPRS scale: 5-10/10 Pain location: Internal  Pain type: sharp and stings alot Pain description: constant   Aggravating factors: stress Relieving factors: medication, yoga  PRECAUTIONS: None  RED FLAGS: None   WEIGHT BEARING RESTRICTIONS: No  FALLS:  Has patient fallen in last 6 months? No  OCCUPATION: retired, works at [employer] with increased standing for 4 hours  ACTIVITY LEVEL : yoga and weightlifting  PLOF: Independent  PATIENT GOALS: reduce pelvic pain.   PERTINENT HISTORY:  Abdominal Hysterectomy; Hypertension; Hypothyroidism;  Sexual abuse: No  BOWEL MOVEMENT: no issues with bowel movements  URINATION: Pain with urination: No Fully empty bladder: Yes:   Stream: Strong Urgency: No Frequency: every 2 hours Leakage:  sit to stand from commode will leak Pads: No  INTERCOURSE: not active   PREGNANCY: none   OBJECTIVE:  Note: Objective measures were completed at Evaluation unless otherwise noted.  PATIENT SURVEYS:  POPIQ-7: 24 PFIQ-7: 24  COGNITION: Overall cognitive status: Within functional limits for tasks assessed     SENSATION: Light touch: Appears intact   POSTURE: rounded shoulders, forward head, and decreased lumbar lordosis   LUMBARAROM/PROM: full lumbar ROM   LOWER EXTREMITY VHQ:IONG bilateral hip ROM  LOWER EXTREMITY EXB:[id number] hip strength is 5/5  PALPATION:   General: Tightness in the left hamstring and tenderness located in the insertion of the left hamstring at the ischial tuberosity. The left hamstring will glide over the ischial tuberosity with a click noise.   Pelvic Alignment: ASIS is equal  Abdominal: good abdominal contraction                External Perineal Exam: tenderness located in the right ischiocavernosus                             Internal Pelvic Floor: tenderness located on the right obturator  internist and ischiocavernosus.   Patient confirms identification and approves PT to assess internal pelvic floor and treatment Yes  PELVIC MMT:   MMT eval  Vaginal 4/5 except on the right side is 2/5  (  Blank rows = not tested)         TODAY'S TREATMENT:                                                                                                                              DATE: 12/10/23  EVAL Trigger Point Dry Needling  Initial Treatment: Pt instructed on Dry Needling rational, procedures, and possible side effects. Pt instructed to expect mild to moderate muscle soreness later in the day and/or into the next day.  Pt instructed in methods to reduce muscle soreness. Pt instructed to continue prescribed HEP. Because Dry Needling was performed over or adjacent to a lung field, pt was educated on S/S of pneumothorax and to seek immediate medical attention should they occur.  Patient was educated on signs and symptoms of infection and other risk factors and advised to seek medical attention should they occur.  Patient verbalized understanding of these instructions and education.   Patient Verbal Consent Given: Yes Education Handout Provided: Yes   Muscles Treated: right ischiocavernosus Electrical Stimulation Performed: No Treatment Response/Outcome: elongation of muscles and trigger point response Manual: to assess for dry needling, manual work to the muscle to elongate after dry needling Self Care: educated patient on how to perform her manual work to the right ischiocavernosus and obturator internist to elongate the muscle at home     PATIENT EDUCATION:  12/10/23 Education details: information on dry needling, educated patient on how to massage the right obturator internist and ischiocavernosus  Person educated: Patient Education method: Explanation, Demonstration, Tactile cues, Verbal cues, and Handouts Education comprehension: verbalized understanding, returned  demonstration, verbal cues required, tactile cues required, and needs further education  HOME EXERCISE PROGRAM: See above  ASSESSMENT:  CLINICAL IMPRESSION: Patient is a 73 y.o. female who was seen today for physical therapy evaluation and treatment for pelvic floor dysfunction. Patient reports right pelvic pain from 5-10/10. Patient has tender area along right ischiocavernosus and obturator internist. Pelvic floor strength is 4/5 with exception of the right side that is 2/5. She has tenderness along the left ischial tuberosity and tightness in the hamstring. She will have the muscles go over the left ischial tuberosity when she flexed her hips with knee extended. Patient will leak urine after she stands up from the commode.   OBJECTIVE IMPAIRMENTS: decreased strength, increased fascial restrictions, increased muscle spasms, and pain.   ACTIVITY LIMITATIONS: sitting and continence  PARTICIPATION LIMITATIONS: driving  PERSONAL FACTORS: Time since onset of injury/illness/exacerbation are also affecting patient's functional outcome.   REHAB POTENTIAL: Excellent  CLINICAL DECISION MAKING: Stable/uncomplicated  EVALUATION COMPLEXITY: Low   GOALS: Goals reviewed with patient? Yes  SHORT TERM GOALS: Target date: 01/06/24  Patient is educated on how to massage the right pelvic floor to reduce the trigger points.  Baseline: Goal status: INITIAL  2.  Patient is able to perform diaphragmatic breathing to relax the pelvic floor when she is stressed to manage her pain.  Baseline:  Goal status: INITIAL    LONG TERM GOALS: Target date: 03/03/24  Patient independent with advanced HEP.  Baseline:  Goal status: INITIAL  2.  Patient reports her pain is </= 1-2/10 due to elongation of the muscles and reduction of trigger points.  Baseline:  Goal status: INITIAL  3.  Patient is able to go from sit to stand on the commode without urinary leakage due to pelvic floor strength is 4/5 with good  circular contraction.  Baseline:  Goal status: INITIAL  4.  POPIQ-7 score is from 24 to 10 due to reduction of pain and frustration.  Baseline:  Goal status: INITIAL   PLAN:  PT FREQUENCY: 1x/week  PT DURATION: 12 weeks  PLANNED INTERVENTIONS: 97110-Therapeutic exercises, 97530- Therapeutic activity, 97112- Neuromuscular re-education, 97535- Self Care, 16109- Manual therapy, Taping, Dry Needling, Cryotherapy, Moist heat, and Biofeedback  PLAN FOR NEXT SESSION: dry needle the left hamstring and right pelvic floor; manual work to the pelvic floor , diaphragmatic breathing   Eulis Foster, PT 12/10/23 1:39 PM   PHYSICAL THERAPY DISCHARGE SUMMARY  Visits from Start of Care: 1  Current functional level related to goals / functional outcomes: See above. Patient called to cancel all appointments and be discharged due to feeling better.    Remaining deficits: See above.    Education / Equipment: HEP   Patient agrees to discharge. Patient goals were not met. Patient is being discharged due to being pleased with the current functional level. Thank you for the referral.   Eulis Foster, PT 12/31/23 9:52 AM

## 2023-12-10 ENCOUNTER — Encounter: Payer: Self-pay | Admitting: Physical Therapy

## 2023-12-10 ENCOUNTER — Other Ambulatory Visit: Payer: Self-pay

## 2023-12-10 ENCOUNTER — Encounter: Payer: Self-pay | Admitting: Internal Medicine

## 2023-12-10 ENCOUNTER — Ambulatory Visit: Payer: No Typology Code available for payment source | Attending: Internal Medicine | Admitting: Physical Therapy

## 2023-12-10 DIAGNOSIS — M6281 Muscle weakness (generalized): Secondary | ICD-10-CM | POA: Diagnosis present

## 2023-12-10 DIAGNOSIS — M6289 Other specified disorders of muscle: Secondary | ICD-10-CM | POA: Diagnosis not present

## 2023-12-10 DIAGNOSIS — R102 Pelvic and perineal pain: Secondary | ICD-10-CM | POA: Diagnosis present

## 2023-12-10 DIAGNOSIS — R252 Cramp and spasm: Secondary | ICD-10-CM | POA: Diagnosis present

## 2023-12-10 NOTE — Patient Instructions (Signed)

## 2023-12-11 ENCOUNTER — Other Ambulatory Visit: Payer: Self-pay | Admitting: Internal Medicine

## 2023-12-11 MED ORDER — PREMARIN 0.625 MG/GM VA CREA: 1.0000 | TOPICAL_CREAM | VAGINAL | 12 refills | Status: DC

## 2023-12-11 MED ORDER — TRAMADOL HCL 50 MG PO TABS: ORAL_TABLET | ORAL | 0 refills | Status: DC

## 2023-12-11 NOTE — Telephone Encounter (Signed)
 Last fill: 11/13/23, 180 tablet 0 refill

## 2023-12-12 ENCOUNTER — Other Ambulatory Visit: Payer: Self-pay | Admitting: Internal Medicine

## 2023-12-14 DIAGNOSIS — Z681 Body mass index (BMI) 19 or less, adult: Secondary | ICD-10-CM | POA: Diagnosis not present

## 2023-12-14 DIAGNOSIS — E039 Hypothyroidism, unspecified: Secondary | ICD-10-CM | POA: Diagnosis not present

## 2023-12-14 DIAGNOSIS — I10 Essential (primary) hypertension: Secondary | ICD-10-CM | POA: Diagnosis not present

## 2023-12-14 DIAGNOSIS — F17211 Nicotine dependence, cigarettes, in remission: Secondary | ICD-10-CM | POA: Diagnosis not present

## 2023-12-14 DIAGNOSIS — E785 Hyperlipidemia, unspecified: Secondary | ICD-10-CM | POA: Diagnosis not present

## 2023-12-14 DIAGNOSIS — I2510 Atherosclerotic heart disease of native coronary artery without angina pectoris: Secondary | ICD-10-CM | POA: Diagnosis not present

## 2023-12-14 DIAGNOSIS — Z008 Encounter for other general examination: Secondary | ICD-10-CM | POA: Diagnosis not present

## 2023-12-16 MED ORDER — ESTRADIOL 0.1 MG/GM VA CREA: 1.0000 | TOPICAL_CREAM | VAGINAL | 12 refills | Status: DC

## 2023-12-16 NOTE — Addendum Note (Signed)
 Addended by: Pincus Sanes on: 12/16/2023 12:03 PM   Modules accepted: Orders

## 2023-12-17 ENCOUNTER — Ambulatory Visit: Payer: Medicare Other | Admitting: Physical Therapy

## 2023-12-21 ENCOUNTER — Telehealth: Payer: Self-pay | Admitting: Physician Assistant

## 2023-12-21 ENCOUNTER — Other Ambulatory Visit (HOSPITAL_COMMUNITY): Payer: Self-pay

## 2023-12-21 ENCOUNTER — Telehealth: Payer: Self-pay

## 2023-12-21 NOTE — Telephone Encounter (Signed)
 Patient Advocate Encounter   The patient was approved for a [employer] grant that will help cover the cost of REPATHA Total amount awarded, $2,500.  Effective: 12/23/23 - 12/21/24   QIH:[id number] DGL:[id number] [id number]   Pharmacy provided with approval and processing information. Patient informed via Dorcas Carrow, CPhT  Pharmacy Patient Advocate Specialist  Direct Number: [phone] Fax: [phone]

## 2023-12-21 NOTE — Telephone Encounter (Signed)
 Pt called in stating she had a grant for Repatha and it is about to expire, please advise about getting this renewed.

## 2023-12-21 NOTE — Telephone Encounter (Signed)
 Renewed grant, see separate encounter for details

## 2023-12-31 ENCOUNTER — Ambulatory Visit: Payer: Medicare Other | Admitting: Physical Therapy

## 2024-01-06 ENCOUNTER — Encounter: Payer: Self-pay | Admitting: Internal Medicine

## 2024-01-07 ENCOUNTER — Encounter: Payer: Medicare Other | Admitting: Physical Therapy

## 2024-01-14 ENCOUNTER — Encounter: Payer: Self-pay | Admitting: Internal Medicine

## 2024-01-14 MED ORDER — HYDROCODONE-ACETAMINOPHEN 5-325 MG PO TABS: 1.0000 | ORAL_TABLET | Freq: Two times a day (BID) | ORAL | 0 refills | Status: DC | PRN

## 2024-01-15 ENCOUNTER — Encounter: Payer: Self-pay | Admitting: Internal Medicine

## 2024-01-16 ENCOUNTER — Other Ambulatory Visit: Payer: Self-pay | Admitting: Internal Medicine

## 2024-01-17 MED ORDER — TRAMADOL HCL 50 MG PO TABS: ORAL_TABLET | ORAL | 0 refills | Status: DC

## 2024-01-21 ENCOUNTER — Other Ambulatory Visit: Payer: Self-pay | Admitting: Internal Medicine

## 2024-01-21 DIAGNOSIS — Z1231 Encounter for screening mammogram for malignant neoplasm of breast: Secondary | ICD-10-CM

## 2024-01-28 ENCOUNTER — Other Ambulatory Visit: Payer: Self-pay | Admitting: Internal Medicine

## 2024-02-06 ENCOUNTER — Encounter: Payer: Self-pay | Admitting: Internal Medicine

## 2024-02-07 ENCOUNTER — Ambulatory Visit
Admission: RE | Admit: 2024-02-07 | Discharge: 2024-02-07 | Disposition: A | Discharge status: Home / Self Care | Source: Ambulatory Visit | Attending: Internal Medicine | Admitting: Internal Medicine

## 2024-02-07 DIAGNOSIS — Z1231 Encounter for screening mammogram for malignant neoplasm of breast: Secondary | ICD-10-CM | POA: Diagnosis not present

## 2024-02-08 DIAGNOSIS — M7062 Trochanteric bursitis, left hip: Secondary | ICD-10-CM | POA: Diagnosis not present

## 2024-02-08 DIAGNOSIS — M160 Bilateral primary osteoarthritis of hip: Secondary | ICD-10-CM | POA: Diagnosis not present

## 2024-02-12 ENCOUNTER — Other Ambulatory Visit: Payer: Self-pay | Admitting: Internal Medicine

## 2024-02-15 ENCOUNTER — Other Ambulatory Visit: Payer: Self-pay | Admitting: Internal Medicine

## 2024-02-15 MED ORDER — TRAMADOL HCL 50 MG PO TABS: ORAL_TABLET | ORAL | 0 refills | Status: DC

## 2024-03-05 ENCOUNTER — Telehealth: Payer: Self-pay | Admitting: Pharmacy Technician

## 2024-03-05 ENCOUNTER — Other Ambulatory Visit (HOSPITAL_COMMUNITY): Payer: Self-pay

## 2024-03-05 NOTE — Telephone Encounter (Signed)
 Pharmacy Patient Advocate Encounter  Received notification from [facility] that Prior Authorization for repatha  has been APPROVED from 12/06/23 to 03/05/25. Spoke to pharmacy to process.Copay is $0.00-with grant.    PA #/Case ID/Reference #:  [id number]      Last pa was done under a different date of birth. Ins had date of birth in 11. Dob now right. [facility] going to merge the wrong dob profile and right dob profile

## 2024-03-05 NOTE — Telephone Encounter (Signed)
 Needing a c/b from you regarding pt's Prior Auth. Please advise

## 2024-03-05 NOTE — Telephone Encounter (Signed)
 Pharmacy Patient Advocate Encounter   Received notification from CoverMyMeds that prior authorization for REPATHA  is required/requested.   Insurance verification completed.   The patient is insured through [facility] .   Per test claim: PA required; PA submitted to above mentioned insurance via CoverMyMeds Key/confirmation #/EOC [id number] Status is pending

## 2024-03-14 ENCOUNTER — Other Ambulatory Visit: Payer: Self-pay | Admitting: Internal Medicine

## 2024-03-23 NOTE — Progress Notes (Unsigned)
 Cardiology Office Note:    Date:  03/25/2024   ID:  Crystal Barnes, DOB Sep 06, 1951, MRN [id number]  PCP:  Colene Dauphin, MD   [facility] Providers Cardiologist:  Luana Rumple, MD Cardiology APP:  Lamond Pilot, PA     Referring MD: Colene Dauphin, MD   Chief Complaint  Patient presents with   Follow-up    HTN    History of Present Illness:    Crystal Barnes is a 73 y.o. female with a hx of CAD, hyperlipidemia, and hypertension.  She relocated from [place]  to [place]  and establish care with Dr. Alvis Ba.  She has a history of CAD and stenting to the front wall of her heart and nonobstructive disease in a different vessel.  She had a nonischemic stress test in 2022.  Echocardiogram in 2022 also reassuring.  For hypertension, she has repeatedly developed hyperkalemia with losartan .  She did not tolerate HCTZ due to extreme weakness. She has had excellent results on Repatha  and Crestor .  She tried to stop Crestor  but LDL increased.  She presents for routine cardiology follow up. She remains active. She has been stable from cardiac perspective but has developed RLS and may need to start methadone, per patient.    Past Medical History:  Diagnosis Date   Arthritis    CAD (coronary artery disease)    Cataract    bilateral-removed   Hyperlipidemia    Hypertension    Hypothyroidism    Myocardial infarction [facility])    Thyroid  disease     Past Surgical History:  Procedure Laterality Date   ABDOMINAL HYSTERECTOMY  2008   CATARACT EXTRACTION Bilateral    COLONOSCOPY     CORONARY STENT PLACEMENT  2014   ENDOSCOPIC MUCOSAL RESECTION N/A 11/24/2019   Procedure: ENDOSCOPIC MUCOSAL RESECTION;  Surgeon: Normie Becton., MD;  Location: [facility] ENDOSCOPY;  Service: Gastroenterology;  Laterality: N/A;   ENDOSCOPIC MUCOSAL RESECTION  08/04/2020   Procedure: ENDOSCOPIC MUCOSAL RESECTION;  Surgeon: Brice Campi Albino Alu., MD;  Location: [facility]  ENDOSCOPY;  Service: Gastroenterology;;   Milta Alosa SIGMOIDOSCOPY N/A 11/24/2019   Procedure: Marlynn Singer;  Surgeon: Normie Becton., MD;  Location: [facility] ENDOSCOPY;  Service: Gastroenterology;  Laterality: N/A;   FLEXIBLE SIGMOIDOSCOPY N/A 08/04/2020   Procedure: FLEXIBLE SIGMOIDOSCOPY;  Surgeon: Brice Campi Albino Alu., MD;  Location: [facility] ENDOSCOPY;  Service: Gastroenterology;  Laterality: N/A;   HEMOSTASIS CLIP PLACEMENT  11/24/2019   Procedure: HEMOSTASIS CLIP PLACEMENT;  Surgeon: Normie Becton., MD;  Location: [facility] ENDOSCOPY;  Service: Gastroenterology;;   HEMOSTASIS CLIP PLACEMENT  08/04/2020   Procedure: HEMOSTASIS CLIP PLACEMENT;  Surgeon: Normie Becton., MD;  Location: [facility] ENDOSCOPY;  Service: Gastroenterology;;   POLYPECTOMY     SUBMUCOSAL LIFTING INJECTION  11/24/2019   Procedure: SUBMUCOSAL LIFTING INJECTION;  Surgeon: Normie Becton., MD;  Location: [facility] ENDOSCOPY;  Service: Gastroenterology;;   SUBMUCOSAL LIFTING INJECTION  08/04/2020   Procedure: SUBMUCOSAL LIFTING INJECTION;  Surgeon: Normie Becton., MD;  Location: [facility] ENDOSCOPY;  Service: Gastroenterology;;   TONSILLECTOMY      Current Medications: Current Meds  Medication Sig   aspirin  EC 81 MG tablet Take 1 tablet (81 mg total) by mouth daily.   Calcium -Magnesium-Vitamin D  (CALCIUM  1200+D3 PO) Take 1 tablet by mouth daily.   clonazePAM  (KLONOPIN ) 0.5 MG tablet TAKE 1 TO 2 TABLETS BY MOUTH EVERY NIGHT AT BEDTIME   Coenzyme Q10 (CO Q 10 PO) Take 1 capsule by mouth every evening.   estradiol  (  ESTRACE ) 0.1 MG/GM vaginal cream Place 1 Applicatorful vaginally 2 (two) times a week.   Evolocumab  (REPATHA  SURECLICK) 140 MG/ML SOAJ INJECT 140 MG INTO THE SKIN EVERY 14 DAYS   ferrous sulfate 325 (65 FE) MG EC tablet Take 325 mg by mouth once.   gabapentin  (NEURONTIN ) 300 MG capsule TAKE 1 CAPSULE BY MOUTH 3 TIMES A DAY   hydrochlorothiazide  (HYDRODIURIL ) 25 MG tablet TAKE 1 TABLET BY  MOUTH DAILY   levothyroxine  (SYNTHROID ) 88 MCG tablet TAKE 1 TABLET BY MOUTH DAILY BEFORE BREAKFAST   meloxicam  (MOBIC ) 15 MG tablet Take 1 tablet (15 mg total) by mouth daily as needed for pain. Take with food.   Multiple Vitamin (MULTIVITAMIN WITH MINERALS) TABS tablet Take 1 tablet by mouth daily.   nitroGLYCERIN (NITROSTAT) 0.4 MG SL tablet Place 1 tablet (0.4 mg total) under the tongue every 5 (five) minutes as needed for chest pain.   Polyethyl Glycol-Propyl Glycol (SYSTANE OP) Place 1 drop into both eyes daily as needed (Dry eyes).    rOPINIRole  (REQUIP ) 1 MG tablet TAKE 3 TO 4 TABLETS BY MOUTH AT BEDTIME   rosuvastatin  (CRESTOR ) 5 MG tablet TAKE 1 TABLET BY MOUTH DAILY   traMADol  (ULTRAM ) 50 MG tablet TAKE 2 TABLETS BY MOUTH EVERY 8 HOURS AS NEEDED     Allergies:   Benadryl [diphenhydramine] and Cymbalta  [duloxetine  hcl]   Social History   Socioeconomic History   Marital status: Single    Spouse name: Not on file   Number of children: Not on file   Years of education: Not on file   Highest education level: Doctorate  Occupational History   Occupation: Retired     Comment: about a year and half ago  Tobacco Use   Smoking status: Former    Types: Cigarettes   Smokeless tobacco: Never  Vaping Use   Vaping status: Never Used  Substance and Sexual Activity   Alcohol use: Not Currently   Drug use: Never   Sexual activity: Not Currently  Other Topics Concern   Not on file  Social History Narrative   Not on file   Social Drivers of Health   Financial Resource Strain: Low Risk  (11/28/2023)   Overall Financial Resource Strain (CARDIA)    Difficulty of Paying Living Expenses: Not hard at all  Food Insecurity: No Food Insecurity (11/28/2023)   Hunger Vital Sign    Worried About Running Out of Food in the Last Year: Never true    Ran Out of Food in the Last Year: Never true  Transportation Needs: No Transportation Needs (11/28/2023)   PRAPARE - Scientist, research (physical sciences) (Medical): No    Lack of Transportation (Non-Medical): No  Physical Activity: Sufficiently Active (11/28/2023)   Exercise Vital Sign    Days of Exercise per Week: 6 days    Minutes of Exercise per Session: 90 min  Stress: No Stress Concern Present (11/28/2023)   [employer] of Occupational Health - Occupational Stress Questionnaire    Feeling of Stress : Not at all  Social Connections: Moderately Integrated (11/28/2023)   Social Connection and Isolation Panel    Frequency of Communication with Friends and Family: More than three times a week    Frequency of Social Gatherings with Friends and Family: Twice a week    Attends Religious Services: 1 to 4 times per year    Active Member of [employer] or Organizations: No    Attends Engineer, structural: More than 4  times per year    Marital Status: Never married     Family History: The patient's family history includes Colon polyps in her mother; Esophageal cancer in her father. There is no history of Colon cancer, Rectal cancer, Stomach cancer, Inflammatory bowel disease, Liver disease, or Pancreatic cancer.  ROS:   Please see the history of present illness.     All other systems reviewed and are negative.  EKGs/Labs/Other Studies Reviewed:    The following studies were reviewed today:  EKG Interpretation Date/Time:  Tuesday March 25 2024 10:33:48 EDT Ventricular Rate:  65 PR Interval:  144 QRS Duration:  66 QT Interval:  410 QTC Calculation: 426 R Axis:   53  Text Interpretation: Normal sinus rhythm Low voltage QRS Septal infarct (cited on or before 02-Apr-2023) When compared with ECG of 02-Apr-2023 11:33, No significant change was found Confirmed by Marcie Sever ([id number]) on 03/25/2024 10:50:43 AM    Recent Labs: 08/07/2023: ALT 26; BUN 33; Creatinine, Ser 0.57; Hemoglobin 14.7; Platelets 544.0; Potassium 4.6; Sodium 137; TSH 3.20  Recent Lipid Panel    Component Value Date/Time   CHOL 140 08/07/2023 1607    CHOL 122 01/30/2022 1156   TRIG 98.0 08/07/2023 1607   HDL 69.40 08/07/2023 1607   HDL 66 01/30/2022 1156   CHOLHDL 2 08/07/2023 1607   VLDL 19.6 08/07/2023 1607   LDLCALC 51 08/07/2023 1607   LDLCALC 38 01/30/2022 1156   LDLCALC 94 07/23/2020 1052     Risk Assessment/Calculations:                Physical Exam:    VS:  BP 112/62   Pulse 65   Ht 5' 2 (1.575 m)   Wt 108 lb 3.2 oz (49.1 kg)   LMP  (LMP Unknown)   SpO2 98%   BMI 19.79 kg/m     Wt Readings from Last 3 Encounters:  03/25/24 108 lb 3.2 oz (49.1 kg)  11/29/23 108 lb (49 kg)  08/07/23 103 lb (46.7 kg)     GEN:  Well nourished, well developed in no acute distress HEENT: Normal NECK: No JVD; No carotid bruits LYMPHATICS: No lymphadenopathy CARDIAC: RRR, no murmurs, rubs, gallops RESPIRATORY:  Clear to auscultation without rales, wheezing or rhonchi  ABDOMEN: Soft, non-tender, non-distended MUSCULOSKELETAL:  No edema; No deformity  SKIN: Warm and dry NEUROLOGIC:  Alert and oriented x 3 PSYCHIATRIC:  Normal affect   ASSESSMENT:    1. Essential hypertension   2. CAD S/P percutaneous coronary angioplasty   3. Hyperlipidemia with target LDL less than 70    PLAN:    In order of problems listed above:  CAD with prior PCI - Continue 81 mg aspirin , Repatha , Crestor  -- no chest pain   Hypertension - Intolerant to losartan  - Previously intolerant to HCTZ due to weakness -although this is listed on her current med list   Hyperlipidemia with LDL goal less than 70 - On Repatha  and 5 mg Crestor  -- LDL was 51 in 07/2023   Follow up in 1 year.           Medication Adjustments/Labs and Tests Ordered: Current medicines are reviewed at length with the patient today.  Concerns regarding medicines are outlined above.  Orders Placed This Encounter  Procedures   EKG 12-Lead   Meds ordered this encounter  Medications   nitroGLYCERIN (NITROSTAT) 0.4 MG SL tablet    Sig: Place 1 tablet (0.4 mg  total) under the tongue every 5 (five) minutes  as needed for chest pain.    Dispense:  25 tablet    Refill:  3    Patient Instructions  Medication Instructions:  Nitroglyercin .4 mg/SL take as needed for chest pain.  *If you need a refill on your cardiac medications before your next appointment, please call your pharmacy*  Lab Work: NONE ordered at this time of appointment   Testing/Procedures: NONE ordered at this time of appointment   Follow-Up: At [facility], you and your health needs are our priority.  As part of our continuing mission to provide you with exceptional heart care, our providers are all part of one team.  This team includes your primary Cardiologist (physician) and Advanced Practice Providers or APPs (Physician Assistants and Nurse Practitioners) who all work together to provide you with the care you need, when you need it.  Your next appointment:   1 year(s)  Provider:   (MD Otha Blight, MD    We recommend signing up for the patient portal called MyChart.  Sign up information is provided on this After Visit Summary.  MyChart is used to connect with patients for Virtual Visits (Telemedicine).  Patients are able to view lab/test results, encounter notes, upcoming appointments, etc.  Non-urgent messages can be sent to your provider as well.   To learn more about what you can do with MyChart, go to [email or web address].   Other Instructions        Signed, Lamond Pilot, [place]  03/25/2024 11:13 AM    [facility]

## 2024-03-25 ENCOUNTER — Ambulatory Visit: Attending: Physician Assistant | Admitting: Physician Assistant

## 2024-03-25 ENCOUNTER — Encounter: Payer: Self-pay | Admitting: Physician Assistant

## 2024-03-25 VITALS — BP 112/62 | HR 65 | Ht 62.0 in | Wt 108.2 lb

## 2024-03-25 DIAGNOSIS — I2510 Atherosclerotic heart disease of native coronary artery without angina pectoris: Secondary | ICD-10-CM

## 2024-03-25 DIAGNOSIS — E785 Hyperlipidemia, unspecified: Secondary | ICD-10-CM

## 2024-03-25 DIAGNOSIS — I10 Essential (primary) hypertension: Secondary | ICD-10-CM | POA: Diagnosis not present

## 2024-03-25 DIAGNOSIS — Z9861 Coronary angioplasty status: Secondary | ICD-10-CM

## 2024-03-25 MED ORDER — NITROGLYCERIN 0.4 MG SL SUBL: 0.4000 mg | SUBLINGUAL_TABLET | SUBLINGUAL | 3 refills | Status: DC | PRN

## 2024-03-25 NOTE — Patient Instructions (Signed)
 Medication Instructions:  Nitroglyercin .4 mg/SL take as needed for chest pain.  *If you need a refill on your cardiac medications before your next appointment, please call your pharmacy*  Lab Work: NONE ordered at this time of appointment   Testing/Procedures: NONE ordered at this time of appointment   Follow-Up: At [facility], you and your health needs are our priority.  As part of our continuing mission to provide you with exceptional heart care, our providers are all part of one team.  This team includes your primary Cardiologist (physician) and Advanced Practice Providers or APPs (Physician Assistants and Nurse Practitioners) who all work together to provide you with the care you need, when you need it.  Your next appointment:   1 year(s)  Provider:   (MD Otha Blight, MD    We recommend signing up for the patient portal called MyChart.  Sign up information is provided on this After Visit Summary.  MyChart is used to connect with patients for Virtual Visits (Telemedicine).  Patients are able to view lab/test results, encounter notes, upcoming appointments, etc.  Non-urgent messages can be sent to your provider as well.   To learn more about what you can do with MyChart, go to [email or web address].   Other Instructions

## 2024-04-14 ENCOUNTER — Other Ambulatory Visit: Payer: Self-pay | Admitting: Internal Medicine

## 2024-04-16 ENCOUNTER — Encounter: Payer: Self-pay | Admitting: Internal Medicine

## 2024-04-16 ENCOUNTER — Other Ambulatory Visit: Payer: Self-pay | Admitting: Cardiovascular Disease

## 2024-04-16 MED ORDER — TRAMADOL HCL 50 MG PO TABS
ORAL_TABLET | ORAL | 0 refills | Status: DC
Start: 2024-04-16 — End: 2024-05-19

## 2024-04-24 ENCOUNTER — Other Ambulatory Visit: Payer: Self-pay | Admitting: Internal Medicine

## 2024-05-07 ENCOUNTER — Other Ambulatory Visit: Payer: Self-pay | Admitting: Cardiovascular Disease

## 2024-05-07 ENCOUNTER — Telehealth: Payer: Self-pay | Admitting: Cardiovascular Disease

## 2024-05-07 DIAGNOSIS — E7800 Pure hypercholesterolemia, unspecified: Secondary | ICD-10-CM

## 2024-05-07 DIAGNOSIS — I2510 Atherosclerotic heart disease of native coronary artery without angina pectoris: Secondary | ICD-10-CM

## 2024-05-07 MED ORDER — REPATHA SURECLICK 140 MG/ML ~~LOC~~ SOAJ: 140.0000 mg | SUBCUTANEOUS | 1 refills | Status: DC

## 2024-05-07 NOTE — Telephone Encounter (Signed)
*  STAT* If patient is at the pharmacy, call can be transferred to refill team.   1. Which medications need to be refilled? (please list name of each medication and dose if known) Evolocumab  (REPATHA  SURECLICK) 140 MG/ML SOAJ   2. Which pharmacy/location (including street and city if local pharmacy) is medication to be sent to?  [id number] - [place], [place] - [address] AT [address]      3. Do they need a 30 day or 90 day supply? 90 day    Pt is out of medication

## 2024-05-13 ENCOUNTER — Other Ambulatory Visit: Payer: Self-pay

## 2024-05-13 ENCOUNTER — Ambulatory Visit (INDEPENDENT_AMBULATORY_CARE_PROVIDER_SITE_OTHER): Admitting: Sports Medicine

## 2024-05-13 ENCOUNTER — Ambulatory Visit: Admitting: Orthopaedic Surgery

## 2024-05-13 DIAGNOSIS — M1612 Unilateral primary osteoarthritis, left hip: Secondary | ICD-10-CM | POA: Insufficient documentation

## 2024-05-13 DIAGNOSIS — M25552 Pain in left hip: Secondary | ICD-10-CM | POA: Diagnosis not present

## 2024-05-13 DIAGNOSIS — G8929 Other chronic pain: Secondary | ICD-10-CM

## 2024-05-13 MED ORDER — LIDOCAINE HCL 1 % IJ SOLN
4.0000 mL | INTRAMUSCULAR | Status: AC | PRN
  Administered 2024-05-13: 4 mL

## 2024-05-13 MED ORDER — METHYLPREDNISOLONE ACETATE 40 MG/ML IJ SUSP
60.0000 mg | INTRAMUSCULAR | Status: AC | PRN
  Administered 2024-05-13: 60 mg via INTRA_ARTICULAR

## 2024-05-13 NOTE — Progress Notes (Signed)
   Procedure Note  Patient: Crystal Barnes             Date of Birth: 1951/02/06           MRN: [id number]             Visit Date: 05/13/2024  Procedures: Visit Diagnoses:  1. Primary osteoarthritis of left hip   2. Chronic left hip pain    Large Joint Inj: L hip joint on 05/13/2024 10:31 AM Indications: pain Details: 22 G 3.5 in needle, ultrasound-guided anterior approach Medications: 4 mL lidocaine  1 %; 60 mg methylPREDNISolone  acetate 40 MG/ML Outcome: tolerated well, no immediate complications  Procedure: US -guided intra-articular hip injection, Left After discussion on risks/benefits/indications and informed verbal consent was obtained, a timeout was performed. Patient was lying supine on exam table. The hip was cleaned with betadine and alcohol swabs. Then utilizing ultrasound guidance, the patient's femoral head and neck junction was identified and subsequently injected with 4:1.5 lidocaine :depomedrol via an in-plane approach with ultrasound visualization of the injectate administered into the hip joint. Patient tolerated procedure well without immediate complications.  Procedure, treatment alternatives, risks and benefits explained, specific risks discussed. Consent was given by the patient. Immediately prior to procedure a time out was called to verify the correct patient, procedure, equipment, support staff and site/side marked as required. Patient was prepped and draped in the usual sterile fashion.     - patient tolerated procedure well, discussed post-injection protocol - follow-up with Dr. Jerri as indicated; I am happy to see them as needed  Lonell Sprang, DO Primary Care Sports Medicine Physician  [facility] - Orthopedics  This note was dictated using Dragon naturally speaking software and may contain errors in syntax, spelling, or content which have not been identified prior to signing this note.

## 2024-05-13 NOTE — Progress Notes (Signed)
 Office Visit Note   Patient: Crystal Barnes           Date of Birth: 11/07/1950           MRN: [id number] Visit Date: 05/13/2024              Requested by: Geofm Glade PARAS, MD [address] [place],  [place] [address] PCP: Geofm Glade PARAS, MD   Assessment & Plan: Visit Diagnoses:  1. Primary osteoarthritis of left hip     Plan: History of Present Illness Crystal Barnes is a 73 year old female with left hip arthritis who presents with worsening hip pain.  She experiences hip pain that has progressively worsened over the years, with pain radiating down her leg, especially after walking for about 45 minutes. Aleve provides some relief, but the pain persists. 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 Her current medications include tramadol  and gabapentin  for pain management, and she also takes Aleve for her hip pain. She has not had a cortisone shot in her hip since she was a teenager.  Physical Exam MUSCULOSKELETAL: Left hip with good range of motion and flexibility, pain on internal rotation.  Results RADIOLOGY Hip X-ray: Degenerative changes with subchondral cysts, near bone-on-bone contact, osteophytes, consistent with degenerative arthritis.  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 X-rays show large cysts, bone on bone contact, and large spurs. Aleve provides some relief; Tylenol  is less effective. Cortisone injections reduce inflammation but do not slow degeneration. Osteopenia treatment does not affect arthritis progression. Aleve use should be cautious due to kidney effects. - Administer cortisone injection in the left hip. - Continue Aleve as needed, up to four times a day, with caution regarding kidney health. - Encourage low-impact exercises such as swimming and walking. - Consider hip replacement if function and  flexibility significantly decline.  Follow-Up Instructions: No follow-ups on file.   Orders:  No orders of the defined types were placed in this encounter.  No orders of the defined types were placed in this encounter.     Procedures: No procedures performed   Clinical Data: No additional findings.   Subjective: Chief Complaint  Patient presents with   Left Hip - Pain    HPI  Review of Systems  Constitutional: Negative.   HENT: Negative.    Eyes: Negative.   Respiratory: Negative.    Cardiovascular: Negative.   Endocrine: Negative.   Musculoskeletal: Negative.   Neurological: Negative.   Hematological: Negative.   Psychiatric/Behavioral: Negative.    All other systems reviewed and are negative.    Objective: Vital Signs: LMP  (LMP Unknown)   Physical Exam Vitals and nursing note reviewed.  Constitutional:      Appearance: She is well-developed.  HENT:     Head: Atraumatic.     Nose: Nose normal.  Eyes:     Extraocular Movements: Extraocular movements intact.  Cardiovascular:     Pulses: Normal pulses.  Pulmonary:     Effort: Pulmonary effort is normal.  Abdominal:     Palpations: Abdomen is soft.  Musculoskeletal:     Cervical back: Neck supple.  Skin:    General: Skin is warm.     Capillary Refill: Capillary refill takes less than 2 seconds.  Neurological:     Mental Status: She is alert. Mental status is at baseline.  Psychiatric:  Behavior: Behavior normal.        Thought Content: Thought content normal.        Judgment: Judgment normal.     Ortho Exam  Specialty Comments:  No specialty comments available.  Imaging: No results found.   PMFS History: Patient Active Problem List   Diagnosis Date Noted   Primary osteoarthritis of left hip 05/13/2024   Mid back pain on right side 11/29/2023   Iron deficiency 01/07/2023   Hyperkalemia 05/15/2022   History of rectal polypectomy 06/06/2020   Serrated adenoma of colon  06/06/2020   Hx of adenomatous colonic polyps 06/06/2020   Myocardial infarction (HCC)    Arthralgia of both hands 06/26/2019   CAD (coronary artery disease) 02/10/2019   HTN (hypertension) 02/10/2019   Hypothyroidism (acquired) 02/10/2019   Hyperlipidemia, mixed 02/10/2019   Osteopenia 02/10/2019   RLS (restless legs syndrome) 02/10/2019   Chronic pain-pelvic floor dysfunction 02/10/2019   Past Medical History:  Diagnosis Date   Arthritis    CAD (coronary artery disease)    Cataract    bilateral-removed   Hyperlipidemia    Hypertension    Hypothyroidism    Myocardial infarction (HCC)    Thyroid  disease     Family History  Problem Relation Age of Onset   Colon polyps Mother    Esophageal cancer Father    Colon cancer Neg Hx    Rectal cancer Neg Hx    Stomach cancer Neg Hx    Inflammatory bowel disease Neg Hx    Liver disease Neg Hx    Pancreatic cancer Neg Hx     Past Surgical History:  Procedure Laterality Date   ABDOMINAL HYSTERECTOMY  2008   CATARACT EXTRACTION Bilateral    COLONOSCOPY     CORONARY STENT PLACEMENT  2014   ENDOSCOPIC MUCOSAL RESECTION N/A 11/24/2019   Procedure: ENDOSCOPIC MUCOSAL RESECTION;  Surgeon: Wilhelmenia Aloha Raddle., MD;  Location: [facility] ENDOSCOPY;  Service: Gastroenterology;  Laterality: N/A;   ENDOSCOPIC MUCOSAL RESECTION  08/04/2020   Procedure: ENDOSCOPIC MUCOSAL RESECTION;  Surgeon: Wilhelmenia Aloha Raddle., MD;  Location: [name]COPY;  Service: Gastroenterology;;   ENID SIGMOIDOSCOPY N/A 11/24/2019   Procedure: ENID MORIN;  Surgeon: Wilhelmenia Aloha Raddle., MD;  Location: [facility] ENDOSCOPY;  Service: Gastroenterology;  Laterality: N/A;   FLEXIBLE SIGMOIDOSCOPY N/A 08/04/2020   Procedure: FLEXIBLE SIGMOIDOSCOPY;  Surgeon: Wilhelmenia Aloha Raddle., MD;  Location: [name];  Service: Gastroenterology;  Laterality: N/A;   HEMOSTASIS CLIP PLACEMENT  11/24/2019   Procedure: HEMOSTASIS CLIP PLACEMENT;  Surgeon: Wilhelmenia Aloha Raddle., MD;  Location: [facility] ENDOSCOPY;  Service: Gastroenterology;;   HEMOSTASIS CLIP PLACEMENT  08/04/2020   Procedure: HEMOSTASIS CLIP PLACEMENT;  Surgeon: Wilhelmenia Aloha Raddle., MD;  Location: [facility] ENDOSCOPY;  Service: Gastroenterology;;   POLYPECTOMY     SUBMUCOSAL LIFTING INJECTION  11/24/2019   Procedure: SUBMUCOSAL LIFTING INJECTION;  Surgeon: Wilhelmenia Aloha Raddle., MD;  Location: [facility] ENDOSCOPY;  Service: Gastroenterology;;   SUBMUCOSAL LIFTING INJECTION  08/04/2020   Procedure: SUBMUCOSAL LIFTING INJECTION;  Surgeon: Wilhelmenia Aloha Raddle., MD;  Location: [name];  Service: Gastroenterology;;   TONSILLECTOMY     Social History   Occupational History   Occupation: Retired     Comment: about a year and half ago  Tobacco Use   Smoking status: Former    Types: Cigarettes   Smokeless tobacco: Never  Vaping Use   Vaping status: Never Used  Substance and Sexual Activity   Alcohol use: Not Currently   Drug use: Never  Sexual activity: Not Currently

## 2024-05-14 ENCOUNTER — Telehealth: Payer: Self-pay

## 2024-05-14 ENCOUNTER — Encounter: Payer: Self-pay | Admitting: Internal Medicine

## 2024-05-14 NOTE — Telephone Encounter (Signed)
 Ronal had to fax a form to [employer] with office notes. She did as urgent but was told it could be from 72 hrs up to 7-12 days before a decision is made.

## 2024-05-14 NOTE — Telephone Encounter (Signed)
 Copied from CRM #[id number]. Topic: Referral - Question >> May 14, 2024  9:08 AM Revonda D wrote: Reason for CRM: Pt is requesting an expedited referral for [facility]. Pt stated that she scheduled an appt with them on 8/11 but they stated that she needs a PA  referral before she can be seen with them. Pt stated that she needs the appt due to restless legs and would like for this request to be expedited so she can still see them on 8/11.Pt would also like a callback today if possible regarding this request.    ----------------------------------------------------------------------- From previous Reason for Contact - Lab/Test Order Request: Reason for CRM:

## 2024-05-14 NOTE — Telephone Encounter (Signed)
 Message sent to [facility] to look into this for patient.

## 2024-05-16 NOTE — Telephone Encounter (Signed)
 Copied from CRM [id number]. Topic: General - Call Back - No Documentation >> May 15, 2024  3:52 PM Rea C wrote: Reason for CRM: Hospital doctor from [facility]  Requests if a new prior authorization be sent over.  Missing info:  ICD-10 Diagnoses codes # of service/units  CPT codes for services needed  Requested dates of service start/end Providers full name, npi, tax id,  servicing provider full name, npi, tax id, and full address [place] as Expeditied because patient wants it ASAP    Phone: [phone] Fax: [phone] >> May 16, 2024  8:36 AM Mercedes MATSU wrote: Patient called to inquire about the status of her prior authorization. After checking it looks like the nurse is working on it.

## 2024-05-19 ENCOUNTER — Other Ambulatory Visit: Payer: Self-pay | Admitting: Internal Medicine

## 2024-05-19 NOTE — Telephone Encounter (Unsigned)
 Copied from CRM #[id number]. Topic: Clinical - Medication Refill >> May 19, 2024 10:42 AM Tanazia G wrote: Medication: traMADol  (ULTRAM ) 50 MG tablet  Has the patient contacted their pharmacy? Yes (Agent: If no, request that the patient contact the pharmacy for the refill. If patient does not wish to contact the pharmacy document the reason why and proceed with request.) (Agent: If yes, when and what did the pharmacy advise?)  This is the patient's preferred pharmacy:  [facility] [id number] [place], [place] - [address] [place] [address] Phone: [phone] Fax: [phone]  Is this the correct pharmacy for this prescription? Yes If no, delete pharmacy and type the correct one.   Has the prescription been filled recently? Yes  Is the patient out of the medication? Yes  Has the patient been seen for an appointment in the last year OR does the patient have an upcoming appointment? Yes  Can we respond through MyChart? Yes  Agent: Please be advised that Rx refills may take up to 3 business days. We ask that you follow-up with your pharmacy.

## 2024-05-20 ENCOUNTER — Encounter: Payer: Self-pay | Admitting: Orthopaedic Surgery

## 2024-05-20 MED ORDER — TRAMADOL HCL 50 MG PO TABS: ORAL_TABLET | ORAL | 0 refills | Status: DC

## 2024-05-21 ENCOUNTER — Encounter: Payer: Self-pay | Admitting: Orthopaedic Surgery

## 2024-05-21 ENCOUNTER — Encounter: Payer: Self-pay | Admitting: Internal Medicine

## 2024-05-21 ENCOUNTER — Other Ambulatory Visit: Payer: Self-pay | Admitting: Orthopaedic Surgery

## 2024-05-21 MED ORDER — HYDROCODONE-ACETAMINOPHEN 5-325 MG PO TABS: 1.0000 | ORAL_TABLET | Freq: Every day | ORAL | 0 refills | Status: DC | PRN

## 2024-05-22 ENCOUNTER — Ambulatory Visit: Admitting: Internal Medicine

## 2024-05-24 ENCOUNTER — Encounter: Payer: Self-pay | Admitting: Orthopaedic Surgery

## 2024-05-24 ENCOUNTER — Encounter: Payer: Self-pay | Admitting: Internal Medicine

## 2024-05-26 ENCOUNTER — Ambulatory Visit
Admission: RE | Admit: 2024-05-26 | Discharge: 2024-05-26 | Disposition: A | Discharge status: Home / Self Care | Attending: Family Medicine | Admitting: Family Medicine

## 2024-05-26 ENCOUNTER — Telehealth: Payer: Self-pay

## 2024-05-26 ENCOUNTER — Ambulatory Visit

## 2024-05-26 VITALS — BP 153/86 | HR 69 | Temp 98.3°F | Resp 16

## 2024-05-26 DIAGNOSIS — M25552 Pain in left hip: Secondary | ICD-10-CM

## 2024-05-26 DIAGNOSIS — M160 Bilateral primary osteoarthritis of hip: Secondary | ICD-10-CM | POA: Diagnosis not present

## 2024-05-26 MED ORDER — OXYCODONE-ACETAMINOPHEN 7.5-325 MG PO TABS: 1.0000 | ORAL_TABLET | Freq: Three times a day (TID) | ORAL | 0 refills | Status: DC | PRN

## 2024-05-26 NOTE — Telephone Encounter (Signed)
 Spoke to patient. Advised she can go to any UC if needed. She is aware if she goes with in [facility] we will be able to see xrays otherwise she will need to bring disc. She states she will go to urgent care on [address]

## 2024-05-26 NOTE — Discharge Instructions (Addendum)
 Advised patient we will follow-up with left hip x-ray results once received.  Advised patient may take Percocet daily or as needed for left hip pain.  Advised patient of sedative effects of this medication.  Encouraged increase daily water intake to 64 ounces per day while taking this medication to avoid constipation.  Advised if symptoms worsen and/or unresolved please follow-up with your orthopedic for further evaluation.

## 2024-05-26 NOTE — ED Triage Notes (Signed)
 Pt reports left hip pain for years. States the hip pain has gotten progressively worse over the last 4 years and has been told she needs a hip replacement by the orthopedic surgery. Pt reports she was prescribed pain medication on Thursday and was only given 10 pills. Reports she has since then ran out and the surgeon is out of town and unable to prescribe any medication. Pt requesting new hip xray and pain medication.

## 2024-05-26 NOTE — ED Provider Notes (Addendum)
 [facility]    CSN: [id number] Arrival date & time: 05/26/24  1014      History   Chief Complaint Chief Complaint  Patient presents with   Hip Pain    Dr  Jerri is out of town .Need hip replacement. X-ray today and medication to allow me to wait until he returns .My appt with him is August 26.Thank you,[name] - Entered by patient    HPI Crystal Barnes is a 73 y.o. female.   HPI pleasant 73 year old female presents with left hip pain and request x-ray today. Patient reports left hip pain progressively worsening over the past 4 years.  Orthopedic surgeon has advised her that she needs hip replacement and prescribed her pain medication last Thursday.  Patient request pain medication for left hip pain PMH significant for CAD (s/p MI), HTN, and HLD.  PDMP reveals patient was written 180 50 mg tramadol  on 05/20/2024.  Orthopedics prescribed Norco 5/325 #10 on 05/21/2024.  Past Medical History:  Diagnosis Date   Arthritis    CAD (coronary artery disease)    Cataract    bilateral-removed   Hyperlipidemia    Hypertension    Hypothyroidism    Myocardial infarction [facility])    Thyroid  disease     Patient Active Problem List   Diagnosis Date Noted   Primary osteoarthritis of left hip 05/13/2024   Mid back pain on right side 11/29/2023   Iron deficiency 01/07/2023   Hyperkalemia 05/15/2022   History of rectal polypectomy 06/06/2020   Serrated adenoma of colon 06/06/2020   Hx of adenomatous colonic polyps 06/06/2020   Myocardial infarction (HCC)    Arthralgia of both hands 06/26/2019   CAD (coronary artery disease) 02/10/2019   HTN (hypertension) 02/10/2019   Hypothyroidism (acquired) 02/10/2019   Hyperlipidemia, mixed 02/10/2019   Osteopenia 02/10/2019   RLS (restless legs syndrome) 02/10/2019   Chronic pain-pelvic floor dysfunction 02/10/2019    Past Surgical History:  Procedure Laterality Date   ABDOMINAL HYSTERECTOMY  2008   CATARACT EXTRACTION Bilateral     COLONOSCOPY     CORONARY STENT PLACEMENT  2014   ENDOSCOPIC MUCOSAL RESECTION N/A 11/24/2019   Procedure: ENDOSCOPIC MUCOSAL RESECTION;  Surgeon: Wilhelmenia Aloha Raddle., MD;  Location: [facility] ENDOSCOPY;  Service: Gastroenterology;  Laterality: N/A;   ENDOSCOPIC MUCOSAL RESECTION  08/04/2020   Procedure: ENDOSCOPIC MUCOSAL RESECTION;  Surgeon: Wilhelmenia Aloha Raddle., MD;  Location: [name];  Service: Gastroenterology;;   ENID SIGMOIDOSCOPY N/A 11/24/2019   Procedure: ENID MORIN;  Surgeon: Wilhelmenia Aloha Raddle., MD;  Location: [facility] ENDOSCOPY;  Service: Gastroenterology;  Laterality: N/A;   FLEXIBLE SIGMOIDOSCOPY N/A 08/04/2020   Procedure: FLEXIBLE SIGMOIDOSCOPY;  Surgeon: Wilhelmenia Aloha Raddle., MD;  Location: [name];  Service: Gastroenterology;  Laterality: N/A;   HEMOSTASIS CLIP PLACEMENT  11/24/2019   Procedure: HEMOSTASIS CLIP PLACEMENT;  Surgeon: Wilhelmenia Aloha Raddle., MD;  Location: [facility] ENDOSCOPY;  Service: Gastroenterology;;   HEMOSTASIS CLIP PLACEMENT  08/04/2020   Procedure: HEMOSTASIS CLIP PLACEMENT;  Surgeon: Wilhelmenia Aloha Raddle., MD;  Location: [name];  Service: Gastroenterology;;   POLYPECTOMY     SUBMUCOSAL LIFTING INJECTION  11/24/2019   Procedure: SUBMUCOSAL LIFTING INJECTION;  Surgeon: Wilhelmenia Aloha Raddle., MD;  Location: [facility] ENDOSCOPY;  Service: Gastroenterology;;   SUBMUCOSAL LIFTING INJECTION  08/04/2020   Procedure: SUBMUCOSAL LIFTING INJECTION;  Surgeon: Wilhelmenia Aloha Raddle., MD;  Location: [name];  Service: Gastroenterology;;   TONSILLECTOMY      OB History     Gravida  0  Para  0   Term  0   Preterm  0   AB  0   Living  0      SAB  0   IAB  0   Ectopic  0   Multiple  0   Live Births  0            Home Medications    Prior to Admission medications   Medication Sig Start Date End Date Taking? Authorizing Provider  oxyCODONE -acetaminophen  (PERCOCET) 7.5-325 MG tablet Take 1 tablet by mouth  every 8 (eight) hours as needed for up to 5 days for severe pain (pain score 7-10). 05/26/24 05/31/24 Yes Teddy Sharper, FNP  aspirin  EC 81 MG tablet Take 1 tablet (81 mg total) by mouth daily. 01/19/21   Croitoru, Mihai, MD  Calcium -Magnesium-Vitamin D  (CALCIUM  1200+D3 PO) Take 1 tablet by mouth daily.    [provider]  clonazePAM  (KLONOPIN ) 0.5 MG tablet TAKE 1 TO 2 TABLETS BY MOUTH EVERY NIGHT AT BEDTIME 03/14/24   Burns, Glade PARAS, MD  Coenzyme Q10 (CO Q 10 PO) Take 1 capsule by mouth every evening.    [provider]  estradiol  (ESTRACE ) 0.1 MG/GM vaginal cream Place 1 Applicatorful vaginally 2 (two) times a week. 12/17/23   Geofm Glade PARAS, MD  Evolocumab  (REPATHA  SURECLICK) 140 MG/ML SOAJ Inject 140 mg into the skin every 14 (fourteen) days. 05/07/24   Croitoru, Mihai, MD  ferrous sulfate 325 (65 FE) MG EC tablet Take 325 mg by mouth once.    [provider]  gabapentin  (NEURONTIN ) 300 MG capsule TAKE 1 CAPSULE BY MOUTH 3 TIMES A DAY 04/24/24   Geofm Glade PARAS, MD  hydrochlorothiazide  (HYDRODIURIL ) 25 MG tablet TAKE 1 TABLET BY MOUTH DAILY 07/05/23   Geofm Glade PARAS, MD  levothyroxine  (SYNTHROID ) 88 MCG tablet TAKE 1 TABLET BY MOUTH DAILY BEFORE BREAKFAST 12/12/23   Geofm Glade PARAS, MD  meloxicam  (MOBIC ) 15 MG tablet Take 1 tablet (15 mg total) by mouth daily as needed for pain. Take with food. 11/29/23   Geofm Glade PARAS, MD  Multiple Vitamin (MULTIVITAMIN WITH MINERALS) TABS tablet Take 1 tablet by mouth daily.    [provider]  nitroGLYCERIN  (NITROSTAT ) 0.4 MG SL tablet Place 1 tablet (0.4 mg total) under the tongue every 5 (five) minutes as needed for chest pain. 03/25/24 03/25/25  Duke, Jon Garre, PA  Polyethyl Glycol-Propyl Glycol (SYSTANE OP) Place 1 drop into both eyes daily as needed (Dry eyes).     [provider]  rOPINIRole  (REQUIP ) 1 MG tablet TAKE 3 TO 4 TABLETS BY MOUTH AT BEDTIME 02/12/24   Burns, Glade PARAS, MD  rosuvastatin  (CRESTOR ) 5 MG tablet  TAKE 1 TABLET BY MOUTH DAILY 04/17/24   Croitoru, Mihai, MD  traMADol  (ULTRAM ) 50 MG tablet TAKE 2 TABLETS BY MOUTH EVERY 8 HOURS AS NEEDED 05/20/24   Geofm Glade PARAS, MD    Family History Family History  Problem Relation Age of Onset   Colon polyps Mother    Esophageal cancer Father    Colon cancer Neg Hx    Rectal cancer Neg Hx    Stomach cancer Neg Hx    Inflammatory bowel disease Neg Hx    Liver disease Neg Hx    Pancreatic cancer Neg Hx     Social History Social History   Tobacco Use   Smoking status: Former    Types: Cigarettes   Smokeless tobacco: Never  Vaping Use   Vaping status:  Never Used  Substance Use Topics   Alcohol use: Not Currently   Drug use: Never     Allergies   Benadryl [diphenhydramine], Penicillins, and Cymbalta  [duloxetine  hcl]   Review of Systems Review of Systems  Musculoskeletal:        Left hip pain x 4 years     Physical Exam Triage Vital Signs ED Triage Vitals  Encounter Vitals Group     BP      Girls Systolic BP Percentile      Girls Diastolic BP Percentile      Boys Systolic BP Percentile      Boys Diastolic BP Percentile      Pulse      Resp      Temp      Temp src      SpO2      Weight      Height      Head Circumference      Peak Flow      Pain Score      Pain Loc      Pain Education      Exclude from Growth Chart    No data found.  Updated Vital Signs BP (!) 153/86 (BP Location: Right Arm)   Pulse 69   Temp 98.3 F (36.8 C) (Oral)   Resp 16   LMP  (LMP Unknown)   SpO2 96%      Physical Exam Vitals and nursing note reviewed.  Constitutional:      Appearance: Normal appearance. She is normal weight.  HENT:     Head: Normocephalic and atraumatic.     Mouth/Throat:     Mouth: Mucous membranes are moist.     Pharynx: Oropharynx is clear.  Eyes:     Extraocular Movements: Extraocular movements intact.     Pupils: Pupils are equal, round, and reactive to light.  Cardiovascular:     Rate and Rhythm:  Normal rate and regular rhythm.     Pulses: Normal pulses.     Heart sounds: Normal heart sounds.  Pulmonary:     Effort: Pulmonary effort is normal.     Breath sounds: Normal breath sounds. No wheezing, rhonchi or rales.  Musculoskeletal:        General: Normal range of motion.     Cervical back: Normal range of motion and neck supple.     Comments: Left hip: Exam limited due to left hip pain  Skin:    General: Skin is warm and dry.  Neurological:     General: No focal deficit present.     Mental Status: She is alert and oriented to person, place, and time. Mental status is at baseline.     Gait: Gait normal.  Psychiatric:        Mood and Affect: Mood normal.        Behavior: Behavior normal.      UC Treatments / Results  Labs (all labs ordered are listed, but only abnormal results are displayed) Labs Reviewed - No data to display  EKG   Radiology DG Hip Unilat W or Wo Pelvis 2-3 Views Left Result Date: 05/26/2024 CLINICAL DATA:  Left hip pain for 4 years. EXAM: DG HIP (WITH OR WITHOUT PELVIS) 2-3V LEFT COMPARISON:  None Available. FINDINGS: No evidence for an acute fracture in the bony pelvis. Loss of joint space in the hips evident, left greater than right with degenerative changes noted in the left acetabulum. No worrisome lytic or sclerotic osseous  abnormality. IMPRESSION: Degenerative changes in the hips, left greater than right. No acute bony findings. Electronically Signed   By: Camellia Candle M.D.   On: 05/26/2024 12:13    Procedures Procedures (including critical care time)  Medications Ordered in UC Medications - No data to display  Initial Impression / Assessment and Plan / UC Course  I have reviewed the triage vital signs and the nursing notes.  Pertinent labs & imaging results that were available during my care of the patient were reviewed by me and considered in my medical decision making (see chart for details).     MDM: 1.  Left hip pain-left hip x-ray  results revealed above, patient reports she is currently not taking tramadol  for restless leg syndrome.  Rx'd Percocet 7.5/325 mg tablet: Take 1 tablet every 8 hours, as needed for acute/severe left hip pain. Advised patient we will follow-up with left hip x-ray results once received.  Advised patient may take Percocet daily or as needed for left hip pain.  Advised patient of sedative effects of this medication.  Encouraged increase daily water intake to 64 ounces per day while taking this medication to avoid constipation. Advised if symptoms worsen and/or unresolved please follow-up with your orthopedic for further evaluation. Patient discharged home, hemodynamically stable. Final Clinical Impressions(s) / UC Diagnoses   Final diagnoses:  Left hip pain     Discharge Instructions      Advised patient we will follow-up with left hip x-ray results once received.  Advised patient may take Percocet daily or as needed for left hip pain.  Advised patient of sedative effects of this medication.  Encouraged increase daily water intake to 64 ounces per day while taking this medication to avoid constipation.  Advised if symptoms worsen and/or unresolved please follow-up with your orthopedic for further evaluation.     ED Prescriptions     Medication Sig Dispense Auth. Provider   oxyCODONE -acetaminophen  (PERCOCET) 7.5-325 MG tablet Take 1 tablet by mouth every 8 (eight) hours as needed for up to 5 days for severe pain (pain score 7-10). 15 tablet Trissa Molina, FNP      I have reviewed the PDMP during this encounter.   Teddy Sharper, FNP 05/26/24 1223    Teddy Sharper, FNP 05/26/24 1230

## 2024-05-29 ENCOUNTER — Encounter: Payer: Self-pay | Admitting: Internal Medicine

## 2024-05-29 NOTE — Progress Notes (Unsigned)
 Subjective:    Patient ID: Crystal Barnes, female    DOB: [birth year], 73 y.o.   MRN: [id number]     HPI Crystal Barnes is here for follow up of her chronic medical problems.  Left hip pain -she has been having left hip pain for a while but has gotten more severe.  She has seen Ortho and then to urgent care and was told she has bone-on-bone arthritis and needs replacement.  She did have a steroid injection, but pain got worse and now she has nerve pain going down her left leg.  She has follow-up with orthopedics next week.  She has been taking oxycodone  and that has helped with the pain, but she needs more because the pain remains severe.  She only takes it for severe pain.  She has not been taking the tramadol .  Would like horizant  600 mg-this has helped her sister who has RLS and she tried hers and it did not help.  Medications and allergies reviewed with patient and updated if appropriate.  Current Outpatient Medications on File Prior to Visit  Medication Sig Dispense Refill   aspirin  EC 81 MG tablet Take 1 tablet (81 mg total) by mouth daily. 30 tablet    Calcium -Magnesium-Vitamin D  (CALCIUM  1200+D3 PO) Take 1 tablet by mouth daily.     clonazePAM  (KLONOPIN ) 0.5 MG tablet TAKE 1 TO 2 TABLETS BY MOUTH EVERY NIGHT AT BEDTIME 60 tablet 2   Coenzyme Q10 (CO Q 10 PO) Take 1 capsule by mouth every evening.     estradiol  (ESTRACE ) 0.1 MG/GM vaginal cream Place 1 Applicatorful vaginally 2 (two) times a week. 42.5 g 12   Evolocumab  (REPATHA  SURECLICK) 140 MG/ML SOAJ Inject 140 mg into the skin every 14 (fourteen) days. 6 mL 1   ferrous sulfate 325 (65 FE) MG EC tablet Take 325 mg by mouth once.     gabapentin  (NEURONTIN ) 300 MG capsule TAKE 1 CAPSULE BY MOUTH 3 TIMES A DAY 270 capsule 0   hydrochlorothiazide  (HYDRODIURIL ) 25 MG tablet TAKE 1 TABLET BY MOUTH DAILY 90 tablet 3   levothyroxine  (SYNTHROID ) 88 MCG tablet TAKE 1 TABLET BY MOUTH DAILY BEFORE BREAKFAST 90 tablet 3   Multiple Vitamin  (MULTIVITAMIN WITH MINERALS) TABS tablet Take 1 tablet by mouth daily.     nitroGLYCERIN  (NITROSTAT ) 0.4 MG SL tablet Place 1 tablet (0.4 mg total) under the tongue every 5 (five) minutes as needed for chest pain. 25 tablet 3   Polyethyl Glycol-Propyl Glycol (SYSTANE OP) Place 1 drop into both eyes daily as needed (Dry eyes).      rOPINIRole  (REQUIP ) 1 MG tablet TAKE 3 TO 4 TABLETS BY MOUTH AT BEDTIME 360 tablet 2   rosuvastatin  (CRESTOR ) 5 MG tablet TAKE 1 TABLET BY MOUTH DAILY 90 tablet 3   traMADol  (ULTRAM ) 50 MG tablet TAKE 2 TABLETS BY MOUTH EVERY 8 HOURS AS NEEDED 180 tablet 0   No current facility-administered medications on file prior to visit.     Review of Systems     Objective:   Vitals:   05/30/24 0822  BP: 110/80  Pulse: 78  Temp: 98.7 F (37.1 C)  SpO2: 99%   BP Readings from Last 3 Encounters:  05/30/24 110/80  05/26/24 (!) 153/86  03/25/24 112/62   Wt Readings from Last 3 Encounters:  05/30/24 106 lb (48.1 kg)  03/25/24 108 lb 3.2 oz (49.1 kg)  11/29/23 108 lb (49 kg)   Body mass index is 19.39  kg/m.    Physical Exam Constitutional:      General: She is not in acute distress.    Appearance: Normal appearance. She is not ill-appearing.  HENT:     Head: Normocephalic and atraumatic.  Musculoskeletal:     Comments: Slight limp with pain because of left hip pain  Skin:    General: Skin is warm and dry.  Neurological:     Mental Status: She is alert. Mental status is at baseline.  Psychiatric:        Mood and Affect: Mood normal.        Behavior: Behavior normal.        Thought Content: Thought content normal.        Judgment: Judgment normal.     Reviewed x-rays that she brought from urgent care.  Lab Results  Component Value Date   WBC 8.7 08/07/2023   HGB 14.7 08/07/2023   HCT 43.4 08/07/2023   PLT 544.0 (H) 08/07/2023   GLUCOSE 102 (H) 08/07/2023   CHOL 140 08/07/2023   TRIG 98.0 08/07/2023   HDL 69.40 08/07/2023   LDLCALC 51  08/07/2023   ALT 26 08/07/2023   AST 30 08/07/2023   NA 137 08/07/2023   K 4.6 08/07/2023   CL 103 08/07/2023   CREATININE 0.57 08/07/2023   BUN 33 (H) 08/07/2023   CO2 26 08/07/2023   TSH 3.20 08/07/2023   HGBA1C 5.3 04/18/2023   MICROALBUR 0.2 07/23/2020     Assessment & Plan:    See Problem List for Assessment and Plan of chronic medical problems.   Follow-up for physical-blood work ordered so she can have it done prior

## 2024-05-30 ENCOUNTER — Ambulatory Visit: Admitting: Internal Medicine

## 2024-05-30 VITALS — BP 110/80 | HR 78 | Temp 98.7°F | Ht 62.0 in | Wt 106.0 lb

## 2024-05-30 DIAGNOSIS — M1612 Unilateral primary osteoarthritis, left hip: Secondary | ICD-10-CM

## 2024-05-30 DIAGNOSIS — G2581 Restless legs syndrome: Secondary | ICD-10-CM

## 2024-05-30 DIAGNOSIS — M85851 Other specified disorders of bone density and structure, right thigh: Secondary | ICD-10-CM

## 2024-05-30 DIAGNOSIS — E039 Hypothyroidism, unspecified: Secondary | ICD-10-CM

## 2024-05-30 DIAGNOSIS — I10 Essential (primary) hypertension: Secondary | ICD-10-CM

## 2024-05-30 DIAGNOSIS — E782 Mixed hyperlipidemia: Secondary | ICD-10-CM

## 2024-05-30 DIAGNOSIS — I2510 Atherosclerotic heart disease of native coronary artery without angina pectoris: Secondary | ICD-10-CM

## 2024-05-30 MED ORDER — OXYCODONE-ACETAMINOPHEN 7.5-325 MG PO TABS: 1.0000 | ORAL_TABLET | Freq: Three times a day (TID) | ORAL | 0 refills | Status: DC | PRN

## 2024-05-30 MED ORDER — HORIZANT 600 MG PO TBCR: 600.0000 mg | EXTENDED_RELEASE_TABLET | Freq: Every day | ORAL | 5 refills | Status: DC

## 2024-05-30 NOTE — Assessment & Plan Note (Signed)
 Chronic S/p stent in 2015 Following with cardiology No symptoms consistent with angina Continue Crestor, Repatha, hydrochlorothiazide, aspirin 81 mg daily

## 2024-05-30 NOTE — Patient Instructions (Addendum)
       Medications changes include :   horizant  600 mg nightly, oxycodone  1/2 tab for severe pain.      Return for Physical Exam.

## 2024-05-30 NOTE — Assessment & Plan Note (Addendum)
 Chronic Pain has gotten much more severe recently Has seen orthopedics and had a steroid injection which unfortunately did not help Also went to urgent care because of severity of pain and was placed on oxycodone  which is helping Has follow-up with orthopedics next week and will discuss surgery, but given the severity of pain she needs something that will help her until she can get surgery Continue oxycodone -acetaminophen  7.5-325 mg - 1/2 pill every 8 hours as needed She will not take any tramadol  while taking the oxycodone 

## 2024-05-30 NOTE — Assessment & Plan Note (Addendum)
 Chronic Varies -some days better than other days Will not be able to see Dr. Maylon until next year Continue iron supplementation Currently taking gabapentin  300 mg 3 times daily, Requip  4 mg/ day -1 mg at noon and 3 mg at night, clonazepam  0.5 mg HS Would like to try Horizant  600 mg daily-tried her sisters and it did help-prescription sent to pharmacy Advised her to use caution because many of the medications she is taking can cause drowsiness Hopefully will be able to see Dr. Maylon sooner than next year Hopefully getting her hip replaced will help with some of the RLS symptoms

## 2024-05-30 NOTE — Assessment & Plan Note (Signed)
 Chronic  Clinically euthyroid Check tsh and will titrate med dose if needed Currently taking levothyroxine 88 mcg daily

## 2024-05-30 NOTE — Assessment & Plan Note (Signed)
Chronic Blood pressure is well-controlled Continue hydrochlorothiazide 25 mg daily Cmp, cbc

## 2024-05-30 NOTE — Assessment & Plan Note (Signed)
 Chronic DEXA up-to-date Continue vitamin D , calcium  daily-check vitamin D  level

## 2024-05-30 NOTE — Assessment & Plan Note (Signed)
Chronic Regular exercise and healthy diet encouraged Check lipid panel  Continue Crestor 5 mg every other day, Repatha 140 mg every 2 weeks 

## 2024-05-31 ENCOUNTER — Other Ambulatory Visit: Payer: Self-pay | Admitting: Medical Genetics

## 2024-06-03 ENCOUNTER — Telehealth: Payer: Self-pay

## 2024-06-03 ENCOUNTER — Ambulatory Visit (INDEPENDENT_AMBULATORY_CARE_PROVIDER_SITE_OTHER): Admitting: Orthopaedic Surgery

## 2024-06-03 ENCOUNTER — Ambulatory Visit: Admitting: Orthopaedic Surgery

## 2024-06-03 ENCOUNTER — Encounter: Payer: Self-pay | Admitting: Internal Medicine

## 2024-06-03 ENCOUNTER — Ambulatory Visit (INDEPENDENT_AMBULATORY_CARE_PROVIDER_SITE_OTHER): Admitting: Internal Medicine

## 2024-06-03 ENCOUNTER — Telehealth: Payer: Self-pay | Admitting: Cardiovascular Disease

## 2024-06-03 ENCOUNTER — Encounter: Payer: Self-pay | Admitting: Orthopaedic Surgery

## 2024-06-03 VITALS — BP 112/74 | HR 100 | Temp 98.4°F | Ht 62.0 in | Wt 106.0 lb

## 2024-06-03 DIAGNOSIS — E782 Mixed hyperlipidemia: Secondary | ICD-10-CM | POA: Diagnosis not present

## 2024-06-03 DIAGNOSIS — G8929 Other chronic pain: Secondary | ICD-10-CM

## 2024-06-03 DIAGNOSIS — G2581 Restless legs syndrome: Secondary | ICD-10-CM

## 2024-06-03 DIAGNOSIS — E611 Iron deficiency: Secondary | ICD-10-CM

## 2024-06-03 DIAGNOSIS — I10 Essential (primary) hypertension: Secondary | ICD-10-CM | POA: Diagnosis not present

## 2024-06-03 DIAGNOSIS — E039 Hypothyroidism, unspecified: Secondary | ICD-10-CM | POA: Diagnosis not present

## 2024-06-03 DIAGNOSIS — M1612 Unilateral primary osteoarthritis, left hip: Secondary | ICD-10-CM | POA: Diagnosis not present

## 2024-06-03 DIAGNOSIS — I2510 Atherosclerotic heart disease of native coronary artery without angina pectoris: Secondary | ICD-10-CM | POA: Diagnosis not present

## 2024-06-03 DIAGNOSIS — Z01818 Encounter for other preprocedural examination: Secondary | ICD-10-CM | POA: Diagnosis not present

## 2024-06-03 MED ORDER — CELECOXIB 200 MG PO CAPS: 200.0000 mg | ORAL_CAPSULE | Freq: Two times a day (BID) | ORAL | 3 refills | Status: DC

## 2024-06-03 MED ORDER — METHYLPREDNISOLONE 4 MG PO TBPK
ORAL_TABLET | ORAL | 0 refills | Status: DC
Start: 2024-06-03 — End: 2024-07-21

## 2024-06-03 NOTE — Patient Instructions (Addendum)
      Blood work was ordered.       Medications changes include :   None

## 2024-06-03 NOTE — Assessment & Plan Note (Signed)
Chronic Blood pressure is well-controlled Continue hydrochlorothiazide 25 mg daily Cmp, cbc

## 2024-06-03 NOTE — Telephone Encounter (Signed)
 Patient given surgical clearance forms for PCP and cardiologist. Aware that we must receive clearance forms back before being able to proceed with scheduling surgery.

## 2024-06-03 NOTE — Assessment & Plan Note (Signed)
 Here for pre-op examination at the request of Dr Jerri for left total hip replacement on TBD Conservative measures have not been effective - to have total joint replacement Chronic medical problems stable No concerning symptoms of CAD, respiratory disease No hx of blood clots, difficulty with anesthesia Will get cardiology clearance Low risk for surgery medically Check labs  - cbc, cmp, lipid

## 2024-06-03 NOTE — Assessment & Plan Note (Signed)
 Chronic Pain has gotten severe To have total hip replacement in 2 months Continue oxycodone -acetaminophen  7.5-325 mg - 1/2 pill every 8 hours as needed She will not take any tramadol  while taking the oxycodone 

## 2024-06-03 NOTE — Assessment & Plan Note (Signed)
 Chronic  Clinically euthyroid Check tsh and will titrate med dose if needed Currently taking levothyroxine 88 mcg daily

## 2024-06-03 NOTE — Assessment & Plan Note (Signed)
Chronic Regular exercise and healthy diet encouraged Check lipid panel  Continue Crestor 5 mg every other day, Repatha 140 mg every 2 weeks 

## 2024-06-03 NOTE — Assessment & Plan Note (Signed)
 Chronic S/p stent in 2015 Following with cardiology No symptoms consistent with angina Continue Crestor, Repatha, hydrochlorothiazide, aspirin 81 mg daily

## 2024-06-03 NOTE — Progress Notes (Signed)
 Office Visit Note   Patient: Crystal Barnes           Date of Birth: [birth year]           MRN: [id number] Visit Date: 06/03/2024              Requested by: Geofm Glade PARAS, MD [address] [place],  [place] [address] PCP: Geofm Glade PARAS, MD   Assessment & Plan: Visit Diagnoses:  1. Primary osteoarthritis of left hip     Plan: History of Present Illness Crystal Barnes is a 73 year old female with severe left hip arthritis who presents with persistent left hip pain.  She experiences severe left hip pain localized in the groin area, constant in nature, significantly affecting her sleep and daily activities, including walking her dogs. A cortisone injection on August 5th did not alleviate her pain. Percocet has also not provided relief. She is not using a cane, as she avoids walking due to the pain.  She has been to urgent care due to adverse effects from previous medications, resulting in blood in her stool. She is currently taking Repatha  for cholesterol management, with an LDL level of 7. She has no current kidney problems and is generally healthy aside from her hip and past heart issues.  RADIOLOGY Hip X-ray: Severe osteoarthritis, bone on bone, Kellgren-Lawrence stage IV  Assessment and Plan Severe left hip osteoarthritis Severe osteoarthritis with bone-on-bone changes confirmed by x-ray, causing significant pain and impacting daily activities. Unresponsive to cortisone injection or Percocet. - Prescribe Medrol  Dosepak. - Prescribe Celebrex . - Administer anti-inflammatory injection in the buttock.  Impression is severe left hip degenerative joint disease secondary to Osteoarthritis.  Patient has attempted conservative treatment for at least 6 consecutive weeks within the past 12 weeks, including but not limited to physical therapy, home exercise program, NSAIDs, activity modification, and/or corticosteroid injections. Despite these efforts, symptoms have not improved or  have worsened. Conservative measures have been deemed unsuccessful at this time. After a detailed discussion covering diagnosis and treatment options--including the risks, benefits, alternatives, and potential complications of surgical and nonsurgical management--the patient elected to proceed with surgery.  Current anticoagulants: No antithrombotic Postop anticoagulation: Aspirin  81 mg Diabetic: No  Prior DVT/PE: No Tobacco use: No Clearances needed for surgery: PCP Anticipate discharge dispo: home   Follow-Up Instructions: No follow-ups on file.   Orders:  No orders of the defined types were placed in this encounter.  Meds ordered this encounter  Medications   methylPREDNISolone  (MEDROL  DOSEPAK) 4 MG TBPK tablet    Sig: Take as directed    Dispense:  21 tablet    Refill:  0   celecoxib  (CELEBREX ) 200 MG capsule    Sig: Take 1 capsule (200 mg total) by mouth 2 (two) times daily.    Dispense:  30 capsule    Refill:  3    Subjective: Chief Complaint  Patient presents with   Left Hip - Pain    HPI  Review of Systems  Constitutional: Negative.   HENT: Negative.    Eyes: Negative.   Respiratory: Negative.    Cardiovascular: Negative.   Endocrine: Negative.   Musculoskeletal: Negative.   Neurological: Negative.   Hematological: Negative.   Psychiatric/Behavioral: Negative.    All other systems reviewed and are negative.    Objective: Vital Signs: LMP  (LMP Unknown)   Physical Exam Vitals and nursing note reviewed.  Constitutional:      Appearance: She is well-developed.  HENT:     Head: Atraumatic.     Nose: Nose normal.  Eyes:     Extraocular Movements: Extraocular movements intact.  Cardiovascular:     Pulses: Normal pulses.  Pulmonary:     Effort: Pulmonary effort is normal.  Abdominal:     Palpations: Abdomen is soft.  Musculoskeletal:     Cervical back: Neck supple.  Skin:    General: Skin is warm.     Capillary Refill: Capillary refill takes  less than 2 seconds.  Neurological:     Mental Status: She is alert. Mental status is at baseline.  Psychiatric:        Behavior: Behavior normal.        Thought Content: Thought content normal.        Judgment: Judgment normal.       PMFS History: Patient Active Problem List   Diagnosis Date Noted   Primary osteoarthritis of left hip 05/13/2024   Mid back pain on right side 11/29/2023   Iron deficiency 01/07/2023   Hyperkalemia 05/15/2022   History of rectal polypectomy 06/06/2020   Serrated adenoma of colon 06/06/2020   Hx of adenomatous colonic polyps 06/06/2020   Myocardial infarction (HCC)    Arthralgia of both hands 06/26/2019   CAD (coronary artery disease) 02/10/2019   HTN (hypertension) 02/10/2019   Hypothyroidism (acquired) 02/10/2019   Hyperlipidemia, mixed 02/10/2019   Osteopenia 02/10/2019   RLS (restless legs syndrome) 02/10/2019   Chronic pain-pelvic floor dysfunction 02/10/2019   Past Medical History:  Diagnosis Date   Arthritis    CAD (coronary artery disease)    Cataract    bilateral-removed   Hyperlipidemia    Hypertension    Hypothyroidism    Myocardial infarction (HCC)    Thyroid  disease     Family History  Problem Relation Age of Onset   Colon polyps Mother    Esophageal cancer Father    Colon cancer Neg Hx    Rectal cancer Neg Hx    Stomach cancer Neg Hx    Inflammatory bowel disease Neg Hx    Liver disease Neg Hx    Pancreatic cancer Neg Hx     Past Surgical History:  Procedure Laterality Date   ABDOMINAL HYSTERECTOMY  2008   CATARACT EXTRACTION Bilateral    COLONOSCOPY     CORONARY STENT PLACEMENT  2014   ENDOSCOPIC MUCOSAL RESECTION N/A 11/24/2019   Procedure: ENDOSCOPIC MUCOSAL RESECTION;  Surgeon: Wilhelmenia Aloha Raddle., MD;  Location: [facility] ENDOSCOPY;  Service: Gastroenterology;  Laterality: N/A;   ENDOSCOPIC MUCOSAL RESECTION  08/04/2020   Procedure: ENDOSCOPIC MUCOSAL RESECTION;  Surgeon: Wilhelmenia Aloha Raddle., MD;   Location: [name] ENDOSCOPY;  Service: Gastroenterology;;   ENID SIGMOIDOSCOPY N/A 11/24/2019   Procedure: ENID MORIN;  Surgeon: Wilhelmenia Aloha Raddle., MD;  Location: [facility] ENDOSCOPY;  Service: Gastroenterology;  Laterality: N/A;   FLEXIBLE SIGMOIDOSCOPY N/A 08/04/2020   Procedure: FLEXIBLE SIGMOIDOSCOPY;  Surgeon: Wilhelmenia Aloha Raddle., MD;  Location: [name] ENDOSCOPY;  Service: Gastroenterology;  Laterality: N/A;   HEMOSTASIS CLIP PLACEMENT  11/24/2019   Procedure: HEMOSTASIS CLIP PLACEMENT;  Surgeon: Wilhelmenia Aloha Raddle., MD;  Location: [facility] ENDOSCOPY;  Service: Gastroenterology;;   HEMOSTASIS CLIP PLACEMENT  08/04/2020   Procedure: HEMOSTASIS CLIP PLACEMENT;  Surgeon: Wilhelmenia Aloha Raddle., MD;  Location: [name] ENDOSCOPY;  Service: Gastroenterology;;   POLYPECTOMY     SUBMUCOSAL LIFTING INJECTION  11/24/2019   Procedure: SUBMUCOSAL LIFTING INJECTION;  Surgeon: Wilhelmenia Aloha Raddle., MD;  Location: [facility] ENDOSCOPY;  Service: Gastroenterology;;  SUBMUCOSAL LIFTING INJECTION  08/04/2020   Procedure: SUBMUCOSAL LIFTING INJECTION;  Surgeon: Wilhelmenia Aloha Raddle., MD;  Location: [name] ENDOSCOPY;  Service: Gastroenterology;;   TONSILLECTOMY     Social History   Occupational History   Occupation: Retired     Comment: about a year and half ago  Tobacco Use   Smoking status: Former    Types: Cigarettes   Smokeless tobacco: Never  Vaping Use   Vaping status: Never Used  Substance and Sexual Activity   Alcohol use: Not Currently   Drug use: Never   Sexual activity: Not Currently

## 2024-06-03 NOTE — Assessment & Plan Note (Signed)
 Chronic Has RLS so need to be on the higher side Continue iron OTC daily Check ferritin given RLS

## 2024-06-03 NOTE — Telephone Encounter (Signed)
   Pre-operative Risk Assessment    Patient Name: Crystal Barnes  DOB: [birth year] MRN: [id number]   Date of last office visit: 03/25/24 Date of next office visit: N/A    Request for Surgical Clearance    Procedure:  L total hip arthroplasty   Date of Surgery:  Clearance TBD                                 Surgeon:  Kay Sharper, MD  Surgeon's Group or Practice Name:  [facility] number:  [phone] Fax number:  [phone]   Type of Clearance Requested:   - Medical    Type of Anesthesia:  Not Indicated   Additional requests/questions:    Crystal Barnes   06/03/2024, 5:46 PM

## 2024-06-03 NOTE — Assessment & Plan Note (Signed)
 Chronic Controlled, stable Continue gabapentin  300 mg QID, tramadol  50 mg TID ( will not take when taking oxycodone )

## 2024-06-03 NOTE — Progress Notes (Signed)
 Subjective:    Patient ID: Crystal Barnes, female    DOB: 06/28/1951, 73 y.o.   MRN: [id number]      HPI Ermina is here for pre-operative clearance at the request of Dr Jerri for left total hip replacement scheduled for TBD.   Tamya denies any personal or family history of problems with anesthesia or bleeding/blood clot problems.    Arali has no concerns and is taking all prescribed medication as prescribed.   Miryah is not exercising regularly due to her hip pain.  With their daily activities they denies chest pain, palpitations, SOB and lightheadedness.        Medications and allergies reviewed with patient and updated if appropriate.  Current Outpatient Medications on File Prior to Visit  Medication Sig Dispense Refill   aspirin  EC 81 MG tablet Take 1 tablet (81 mg total) by mouth daily. 30 tablet    Calcium -Magnesium-Vitamin D  (CALCIUM  1200+D3 PO) Take 1 tablet by mouth daily.     celecoxib  (CELEBREX ) 200 MG capsule Take 1 capsule (200 mg total) by mouth 2 (two) times daily. 30 capsule 3   clonazePAM  (KLONOPIN ) 0.5 MG tablet TAKE 1 TO 2 TABLETS BY MOUTH EVERY NIGHT AT BEDTIME 60 tablet 2   Coenzyme Q10 (CO Q 10 PO) Take 1 capsule by mouth every evening.     estradiol  (ESTRACE ) 0.1 MG/GM vaginal cream Place 1 Applicatorful vaginally 2 (two) times a week. 42.5 g 12   Evolocumab  (REPATHA  SURECLICK) 140 MG/ML SOAJ Inject 140 mg into the skin every 14 (fourteen) days. 6 mL 1   ferrous sulfate 325 (65 FE) MG EC tablet Take 325 mg by mouth once.     gabapentin  (NEURONTIN ) 300 MG capsule TAKE 1 CAPSULE BY MOUTH 3 TIMES A DAY 270 capsule 0   Gabapentin  Enacarbil (HORIZANT ) 600 MG TBCR Take 1 tablet (600 mg total) by mouth daily. 30 tablet 5   hydrochlorothiazide  (HYDRODIURIL ) 25 MG tablet TAKE 1 TABLET BY MOUTH DAILY 90 tablet 3   levothyroxine  (SYNTHROID ) 88 MCG tablet TAKE 1 TABLET BY MOUTH DAILY BEFORE BREAKFAST 90 tablet 3   methylPREDNISolone  (MEDROL  DOSEPAK) 4 MG TBPK tablet Take  as directed 21 tablet 0   Multiple Vitamin (MULTIVITAMIN WITH MINERALS) TABS tablet Take 1 tablet by mouth daily.     nitroGLYCERIN  (NITROSTAT ) 0.4 MG SL tablet Place 1 tablet (0.4 mg total) under the tongue every 5 (five) minutes as needed for chest pain. 25 tablet 3   oxyCODONE -acetaminophen  (PERCOCET) 7.5-325 MG tablet Take 1 tablet by mouth every 8 (eight) hours as needed for severe pain (pain score 7-10) (chronic hip pain). 60 tablet 0   Polyethyl Glycol-Propyl Glycol (SYSTANE OP) Place 1 drop into both eyes daily as needed (Dry eyes).      rOPINIRole  (REQUIP ) 1 MG tablet TAKE 3 TO 4 TABLETS BY MOUTH AT BEDTIME 360 tablet 2   rosuvastatin  (CRESTOR ) 5 MG tablet TAKE 1 TABLET BY MOUTH DAILY 90 tablet 3   traMADol  (ULTRAM ) 50 MG tablet TAKE 2 TABLETS BY MOUTH EVERY 8 HOURS AS NEEDED 180 tablet 0   No current facility-administered medications on file prior to visit.    Review of Systems  Constitutional:  Negative for fever.  Eyes:  Negative for visual disturbance.  Respiratory:  Negative for cough, shortness of breath and wheezing.   Cardiovascular:  Negative for chest pain, palpitations and leg swelling.  Gastrointestinal:  Negative for abdominal pain, blood in stool, constipation and diarrhea.  No gerd  Genitourinary:  Negative for dysuria.  Musculoskeletal:  Positive for arthralgias (left hip). Negative for back pain.  Skin:  Negative for rash.  Neurological:  Negative for dizziness, light-headedness, numbness and headaches.  Psychiatric/Behavioral:  Negative for dysphoric mood and sleep disturbance. The patient is not nervous/anxious.        Objective:   Vitals:   06/03/24 1502  BP: 112/74  Pulse: 100  Temp: 98.4 F (36.9 C)  SpO2: 97%   Filed Weights   06/03/24 1502  Weight: 106 lb (48.1 kg)   Body mass index is 19.39 kg/m.  BP Readings from Last 3 Encounters:  06/03/24 112/74  05/30/24 110/80  05/26/24 (!) 153/86    Wt Readings from Last 3 Encounters:   06/03/24 106 lb (48.1 kg)  05/30/24 106 lb (48.1 kg)  03/25/24 108 lb 3.2 oz (49.1 kg)       Physical Exam Constitutional: She appears well-developed and well-nourished. No distress.  HENT:  Head: Normocephalic and atraumatic.  Right Ear: External ear normal. Normal ear canal and TM Left Ear: External ear normal.  Normal ear canal and TM Mouth/Throat: Oropharynx is clear and moist.  Eyes: Conjunctivae normal.  Neck: Neck supple. No tracheal deviation present. No thyromegaly present.  No carotid bruit  Cardiovascular: Normal rate, regular rhythm and normal heart sounds.   No murmur heard.  No edema. Pulmonary/Chest: Effort normal and breath sounds normal. No respiratory distress. She has no wheezes. She has no rales.  Abdominal: Soft. She exhibits no distension. There is no tenderness.  Lymphadenopathy: She has no cervical adenopathy.  Skin: Skin is warm and dry. She is not diaphoretic.  Psychiatric: She has a normal mood and affect. Her behavior is normal.     Lab Results  Component Value Date   WBC 8.7 08/07/2023   HGB 14.7 08/07/2023   HCT 43.4 08/07/2023   PLT 544.0 (H) 08/07/2023   GLUCOSE 102 (H) 08/07/2023   CHOL 140 08/07/2023   TRIG 98.0 08/07/2023   HDL 69.40 08/07/2023   LDLCALC 51 08/07/2023   ALT 26 08/07/2023   AST 30 08/07/2023   NA 137 08/07/2023   K 4.6 08/07/2023   CL 103 08/07/2023   CREATININE 0.57 08/07/2023   BUN 33 (H) 08/07/2023   CO2 26 08/07/2023   TSH 3.20 08/07/2023   HGBA1C 5.3 04/18/2023   MICROALBUR 0.2 07/23/2020    03/25/2024:   EKG:  NSR @ 65 bpm, low QRS      Assessment & Plan:       See Problem List for Assessment and Plan of chronic medical problems.

## 2024-06-03 NOTE — Telephone Encounter (Signed)
 Paper Work Dropped Off: Pre-Op paperwork to be completed by Dr. JAYSON or team/APPs & faxed to number on form.  Date: 06-03-24  Location of paper:  Dr. Fanny mailbox

## 2024-06-03 NOTE — Assessment & Plan Note (Signed)
 Chronic Varies -some days better than other days Will not be able to see Dr. Maylon until next year Continue iron supplementation Currently taking gabapentin  300 mg 3 times daily, Requip  4 mg/ day -1 mg at noon and 3 mg at night, clonazepam  0.5 mg HS Would like to try Horizant  600 mg daily-tried her sisters and it did help ( too expensive so will likely not be able to take) Advised her to use caution because many of the medications she is taking can cause drowsiness

## 2024-06-04 ENCOUNTER — Encounter: Payer: Self-pay | Admitting: Internal Medicine

## 2024-06-04 DIAGNOSIS — M1612 Unilateral primary osteoarthritis, left hip: Secondary | ICD-10-CM

## 2024-06-04 NOTE — Telephone Encounter (Signed)
   Name: Crystal Barnes  DOB: November 22, 1950  MRN: [id number]  Primary Cardiologist: Jerel Balding, MD   Preoperative team, please contact this patient and set up a phone call appointment for further preoperative risk assessment. Please obtain consent and complete medication review. Thank you for your help.  I confirm that guidance regarding antiplatelet and oral anticoagulation therapy has been completed and, if necessary, noted below.  I also confirmed the patient resides in the state of [place] . As per [facility] Medical Board telemedicine laws, the patient must reside in the state in which the provider is licensed.   Jon Nat Hails, PA 06/04/2024, 9:08 AM [facility]

## 2024-06-04 NOTE — Telephone Encounter (Signed)
 Patient prefers to be seen in person for pre-op clearance rather than a virtual appointment. She is scheduled for 06/10/24 with Reche Finder, NP.

## 2024-06-06 ENCOUNTER — Encounter: Payer: Self-pay | Admitting: Orthopaedic Surgery

## 2024-06-06 ENCOUNTER — Encounter: Payer: Self-pay | Admitting: Internal Medicine

## 2024-06-06 DIAGNOSIS — M1612 Unilateral primary osteoarthritis, left hip: Secondary | ICD-10-CM

## 2024-06-06 DIAGNOSIS — G8929 Other chronic pain: Secondary | ICD-10-CM

## 2024-06-06 NOTE — Telephone Encounter (Signed)
 Please order lumbar spine and left hip MRI. Thanks.

## 2024-06-09 MED ORDER — METHOCARBAMOL 500 MG PO TABS: 500.0000 mg | ORAL_TABLET | Freq: Four times a day (QID) | ORAL | 2 refills | Status: DC

## 2024-06-10 ENCOUNTER — Encounter (HOSPITAL_BASED_OUTPATIENT_CLINIC_OR_DEPARTMENT_OTHER): Payer: Self-pay | Admitting: Family

## 2024-06-10 ENCOUNTER — Encounter: Payer: Self-pay | Admitting: Orthopaedic Surgery

## 2024-06-10 ENCOUNTER — Telehealth: Payer: Self-pay | Admitting: Orthopaedic Surgery

## 2024-06-10 ENCOUNTER — Ambulatory Visit (HOSPITAL_BASED_OUTPATIENT_CLINIC_OR_DEPARTMENT_OTHER): Admitting: Family

## 2024-06-10 ENCOUNTER — Ambulatory Visit: Admitting: Orthopaedic Surgery

## 2024-06-10 VITALS — BP 120/80 | HR 66 | Ht 62.0 in | Wt 105.1 lb

## 2024-06-10 DIAGNOSIS — E785 Hyperlipidemia, unspecified: Secondary | ICD-10-CM

## 2024-06-10 DIAGNOSIS — Z01810 Encounter for preprocedural cardiovascular examination: Secondary | ICD-10-CM

## 2024-06-10 DIAGNOSIS — I10 Essential (primary) hypertension: Secondary | ICD-10-CM | POA: Diagnosis not present

## 2024-06-10 DIAGNOSIS — I25118 Atherosclerotic heart disease of native coronary artery with other forms of angina pectoris: Secondary | ICD-10-CM

## 2024-06-10 NOTE — Patient Instructions (Signed)
 Medication Instructions:  Hold Aspirin  7 days prior to hip surgery *If you need a refill on your cardiac medications before your next appointment, please call your pharmacy*  Follow-Up: At [facility], you and your health needs are our priority.  As part of our continuing mission to provide you with exceptional heart care, our providers are all part of one team.  This team includes your primary Cardiologist (physician) and Advanced Practice Providers or APPs (Physician Assistants and Nurse Practitioners) who all work together to provide you with the care you need, when you need it.  Your next appointment:   June 2026 with Dr. Francyne Recommend calling in March 2026 to schedule  We recommend signing up for the patient portal called MyChart.  Sign up information is provided on this After Visit Summary.  MyChart is used to connect with patients for Virtual Visits (Telemedicine).  Patients are able to view lab/test results, encounter notes, upcoming appointments, etc.  Non-urgent messages can be sent to your provider as well.   To learn more about what you can do with MyChart, go to [email or web address].   Other Instructions We sent note to orthopedics that you are good to go for surgery.  If you develop any new symptoms prior to surgery (chest pain, trouble breathing) please let us  know.

## 2024-06-10 NOTE — Telephone Encounter (Signed)
 Called patient to offer surgery date for left total hip arthroplasty with Dr. Jerri.  No answer.  Left message on voicemail providing name and direct number for scheduling.

## 2024-06-10 NOTE — Progress Notes (Signed)
 Cardiology Office Note   Date:  06/10/2024  ID:  Crystal Barnes, DOB October 15, 1950, MRN [id number] PCP: Geofm Glade PARAS, MD  [facility] Providers Cardiologist:  Jerel Balding, MD Cardiology APP:  Madie Jon Garre, PA     History of Present Illness Crystal Barnes is a 73 y.o. female with history of CAD s/p stenting, HLD, hypertension.  Relocated from [place]  to [place]  and established with Dr. Balding.   In 2014 had angina pectoris described as elephant on chest and received a stent to front wall of heart and another 60% blockage in another vessel.  Echocardiogram 12/2020 normal LVEF 68%, RV normal, trivial MR, calcification of aortic valve without stenosis.  ETT 12/2020 negative for ischemia.  Presents today for preop clearance for left total hip arthroplasty. Reports no shortness of breath nor dyspnea on exertion. Reports no chest pain, pressure, or tightness. No edema, orthopnea, PND. Reports no palpitations.  Exercise limited by hip pain though still completing >4 METS. Home BP routinely 120s.  Previous antihypertensives Losartan  - hyperkalemia  Hydrochlorothiazide  - extreme weakness, did not tolerate  ROS: Please see the history of present illness.    All other systems reviewed and are negative.   Studies Reviewed EKG Interpretation Date/Time:  Tuesday June 10 2024 09:04:46 EDT Ventricular Rate:  66 PR Interval:  130 QRS Duration:  82 QT Interval:  412 QTC Calculation: 431 R Axis:   72  Text Interpretation: Normal sinus rhythm Normal ECG Confirmed by Vannie Mora ([id number]) on 06/10/2024 9:07:48 AM    Cardiac Studies & Procedures   ______________________________________________________________________________________________   STRESS TESTS  EXERCISE TOLERANCE TEST (ETT) 01/06/2021  Interpretation Summary  Exercise stress test: Clinically and electrically negative for ischemia  Excellent exercise tolerance  Normal exercise stress  test   ECHOCARDIOGRAM  ECHOCARDIOGRAM COMPLETE 01/04/2021  Narrative ECHOCARDIOGRAM REPORT    Patient Name:   Crystal Barnes Date of Exam: 01/04/2021 Medical Rec #:  [id number]         Height:       62.0 in Accession #:    [id number]        Weight:       112.4 lb Date of Birth:  Nov 15, 1950        BSA:          1.496 m Patient Age:    73 years          BP:           120/70 mmHg Patient Gender: F                 HR:           62 bpm. Exam Location:  [address]  Procedure: 2D Echo, 3D Echo, Cardiac Doppler, Color Doppler and Strain Analysis  Indications:    R07.9 Chest pain  History:        Patient has no prior history of Echocardiogram examinations. Risk Factors:Hypertension and Dyslipidemia. Coronary artery disease. Thyroid  disease. Myocardial infarction.  Sonographer:    Carl Coma RDCS Referring Phys: [id number] ALLISON WOLFE  IMPRESSIONS   1. Left ventricular ejection fraction by 3D volume is 68 %. The left ventricle has normal function. The left ventricle has no regional wall motion abnormalities. Left ventricular diastolic parameters were normal. The average left ventricular global longitudinal strain is -22.1 %. The global longitudinal strain is normal. 2. Right ventricular systolic function is normal. The right ventricular size is normal. There is normal pulmonary artery systolic pressure.  The estimated right ventricular systolic pressure is 21.7 mmHg. 3. The mitral valve is normal in structure. Trivial mitral valve regurgitation. No evidence of mitral stenosis. 4. The aortic valve is tricuspid. There is mild calcification of the aortic valve. Aortic valve regurgitation is not visualized. No aortic stenosis is present. 5. The inferior vena cava is normal in size with greater than 50% respiratory variability, suggesting right atrial pressure of 3 mmHg.  Conclusion(s)/Recommendation(s): Normal biventricular function without evidence of hemodynamically  significant valvular heart disease.  FINDINGS Left Ventricle: Left ventricular ejection fraction by 3D volume is 68 %. The left ventricle has normal function. The left ventricle has no regional wall motion abnormalities. The average left ventricular global longitudinal strain is -22.1 %. The global longitudinal strain is normal. The left ventricular internal cavity size was normal in size. There is no left ventricular hypertrophy. Left ventricular diastolic parameters were normal.  Right Ventricle: The right ventricular size is normal. No increase in right ventricular wall thickness. Right ventricular systolic function is normal. There is normal pulmonary artery systolic pressure. The tricuspid regurgitant velocity is 2.16 m/s, and with an assumed right atrial pressure of 3 mmHg, the estimated right ventricular systolic pressure is 21.7 mmHg.  Left Atrium: Left atrial size was normal in size.  Right Atrium: Right atrial size was normal in size.  Pericardium: There is no evidence of pericardial effusion.  Mitral Valve: The mitral valve is normal in structure. Trivial mitral valve regurgitation. No evidence of mitral valve stenosis.  Tricuspid Valve: The tricuspid valve is normal in structure. Tricuspid valve regurgitation is trivial. No evidence of tricuspid stenosis.  Aortic Valve: The aortic valve is tricuspid. There is mild calcification of the aortic valve. Aortic valve regurgitation is not visualized. No aortic stenosis is present.  Pulmonic Valve: The pulmonic valve was normal in structure. Pulmonic valve regurgitation is trivial. No evidence of pulmonic stenosis.  Aorta: The aortic root is normal in size and structure.  Venous: The inferior vena cava is normal in size with greater than 50% respiratory variability, suggesting right atrial pressure of 3 mmHg.  IAS/Shunts: No atrial level shunt detected by color flow Doppler.   LEFT VENTRICLE PLAX 2D LVIDd:         4.10 cm          Diastology LVIDs:         2.20 cm         LV e' medial:    9.46 cm/s LV PW:         0.80 cm         LV E/e' medial:  7.6 LV IVS:        0.60 cm         LV e' lateral:   10.70 cm/s LVOT diam:     1.80 cm         LV E/e' lateral: 6.7 LV SV:         55 LV SV Index:   37              2D LVOT Area:     2.54 cm        Longitudinal Strain 2D Strain GLS  -23.3 % (A2C): 2D Strain GLS  -22.3 % (A3C): 2D Strain GLS  -20.8 % (A4C): 2D Strain GLS  -22.1 % Avg:  3D Volume EF LV 3D EF:    Left ventricular ejection fraction by 3D volume is 68 %.  3D Volume EF: 3D EF:  68 % LV EDV:       82 ml LV ESV:       26 ml LV SV:        56 ml  RIGHT VENTRICLE             IVC RV Basal diam:  3.80 cm     IVC diam: 1.10 cm RV S prime:     16.60 cm/s TAPSE (M-mode): 2.4 cm  LEFT ATRIUM             Index       RIGHT ATRIUM           Index LA diam:        4.00 cm 2.67 cm/m  RA Area:     10.50 cm LA Vol (A2C):   40.9 ml 27.33 ml/m RA Volume:   24.20 ml  16.17 ml/m LA Vol (A4C):   33.0 ml 22.05 ml/m LA Biplane Vol: 37.9 ml 25.33 ml/m AORTIC VALVE LVOT Vmax:   101.00 cm/s LVOT Vmean:  70.000 cm/s LVOT VTI:    0.216 m  AORTA Ao Root diam: 3.20 cm Ao Asc diam:  3.10 cm  MITRAL VALVE               TRICUSPID VALVE MV Area (PHT): 2.60 cm    TR Peak grad:   18.7 mmHg MV Decel Time: 292 msec    TR Vmax:        216.00 cm/s MV E velocity: 72.00 cm/s MV A velocity: 72.00 cm/s  SHUNTS MV E/A ratio:  1.00        Systemic VTI:  0.22 m Systemic Diam: 1.80 cm  Soyla Merck MD Electronically signed by Soyla Merck MD Signature Date/Time: 01/04/2021/12:16:10 PM    Final          ______________________________________________________________________________________________      Risk Assessment/Calculations           Physical Exam VS:  BP 120/80 Comment: home BP  Pulse 66   Ht 5' 2 (1.575 m)   Wt 105 lb 1.6 oz (47.7 kg)   LMP  (LMP Unknown)   SpO2 99%   BMI 19.22  kg/m        Wt Readings from Last 3 Encounters:  06/10/24 105 lb 1.6 oz (47.7 kg)  06/03/24 106 lb (48.1 kg)  05/30/24 106 lb (48.1 kg)    GEN: Well nourished, well developed in no acute distress NECK: No JVD; No carotid bruits CARDIAC: RRR, no murmurs, rubs, gallops RESPIRATORY:  Clear to auscultation without rales, wheezing or rhonchi  ABDOMEN: Soft, non-tender, non-distended EXTREMITIES:  No edema; No deformity   ASSESSMENT AND PLAN  Preop-According to the Revised Cardiac Risk Index (RCRI), her Perioperative Risk of Major Cardiac Event is (%): 6.6. Her Functional Capacity in METs is: 5.62 according to the Duke Activity Status Index (DASI). Per AHA/ACC guidelines, she is deemed acceptable risk for the planned procedure without additional cardiovascular testing. Will route to surgical team so they are aware.  May hold aspirin  7 days prior to planned procedure.   CAD/HLD, LDL goal less than 70- Stable with no anginal symptoms. No indication for ischemic evaluation.  GDMT aspirin  81mg  daily, repatha  140mg  q14 days, rosuvastatin  5mg  daily. 08/07/23 LDL 51. Recommend aiming for 150 minutes of moderate intensity activity per week and following a heart healthy diet.    HTN- controlled by home readings. Continue present antihypertensive regimen hydrochlorothiazide  25mg  daily. Discussed to monitor BP at home at least  2 hours after medications and sitting for 5-10 minutes.        Dispo: follow up 03/2025 with Dr. Francyne  Signed, Reche GORMAN Finder, NP

## 2024-06-10 NOTE — Telephone Encounter (Signed)
 Orders entered

## 2024-06-16 ENCOUNTER — Ambulatory Visit
Admission: RE | Admit: 2024-06-16 | Discharge: 2024-06-16 | Disposition: A | Discharge status: Home / Self Care | Source: Ambulatory Visit | Attending: Orthopaedic Surgery | Admitting: Orthopaedic Surgery

## 2024-06-16 DIAGNOSIS — M4317 Spondylolisthesis, lumbosacral region: Secondary | ICD-10-CM | POA: Diagnosis not present

## 2024-06-16 DIAGNOSIS — M4807 Spinal stenosis, lumbosacral region: Secondary | ICD-10-CM | POA: Diagnosis not present

## 2024-06-16 DIAGNOSIS — M47817 Spondylosis without myelopathy or radiculopathy, lumbosacral region: Secondary | ICD-10-CM | POA: Diagnosis not present

## 2024-06-16 DIAGNOSIS — M5127 Other intervertebral disc displacement, lumbosacral region: Secondary | ICD-10-CM | POA: Diagnosis not present

## 2024-06-16 DIAGNOSIS — M1612 Unilateral primary osteoarthritis, left hip: Secondary | ICD-10-CM

## 2024-06-16 DIAGNOSIS — G8929 Other chronic pain: Secondary | ICD-10-CM

## 2024-06-17 ENCOUNTER — Encounter: Payer: Self-pay | Admitting: Internal Medicine

## 2024-06-17 MED ORDER — TIZANIDINE HCL 2 MG PO TABS: 2.0000 mg | ORAL_TABLET | Freq: Three times a day (TID) | ORAL | 2 refills | Status: DC | PRN

## 2024-06-18 ENCOUNTER — Ambulatory Visit
Admission: RE | Admit: 2024-06-18 | Discharge: 2024-06-18 | Disposition: A | Discharge status: Home / Self Care | Source: Ambulatory Visit | Attending: Orthopaedic Surgery | Admitting: Orthopaedic Surgery

## 2024-06-18 DIAGNOSIS — M25552 Pain in left hip: Secondary | ICD-10-CM | POA: Diagnosis not present

## 2024-06-20 ENCOUNTER — Encounter: Payer: Self-pay | Admitting: Internal Medicine

## 2024-06-22 MED ORDER — OXYCODONE-ACETAMINOPHEN 7.5-325 MG PO TABS: 1.0000 | ORAL_TABLET | ORAL | 0 refills | Status: DC | PRN

## 2024-06-22 NOTE — Addendum Note (Signed)
 Addended by: GEOFM GLADE PARAS on: 06/22/2024 02:32 PM   Modules accepted: Orders

## 2024-06-25 ENCOUNTER — Other Ambulatory Visit: Payer: Self-pay | Admitting: Internal Medicine

## 2024-06-25 ENCOUNTER — Encounter: Payer: Self-pay | Admitting: Internal Medicine

## 2024-06-26 ENCOUNTER — Encounter: Payer: Self-pay | Admitting: Internal Medicine

## 2024-06-27 ENCOUNTER — Encounter: Payer: Self-pay | Admitting: Orthopaedic Surgery

## 2024-06-27 ENCOUNTER — Telehealth: Payer: Self-pay | Admitting: Physical Medicine and Rehabilitation

## 2024-06-27 NOTE — Telephone Encounter (Signed)
 Pt called stating she was advised to make an appt with [facility]etta per her PCP for torn muscle. Pt has an upcoming surgery withg Xu and do pt need to see Burnetta for the torn muscle. Please call pt at [phone].

## 2024-06-27 NOTE — Telephone Encounter (Signed)
 Please see MRI findings of hip, that was done after last visit and after scheduling her THA. Does she need to see someone to have the hamstring tear fixed?

## 2024-06-28 ENCOUNTER — Ambulatory Visit

## 2024-06-28 DIAGNOSIS — M79652 Pain in left thigh: Secondary | ICD-10-CM | POA: Diagnosis not present

## 2024-06-30 ENCOUNTER — Other Ambulatory Visit: Payer: Self-pay | Admitting: Internal Medicine

## 2024-06-30 ENCOUNTER — Other Ambulatory Visit: Payer: Self-pay

## 2024-06-30 DIAGNOSIS — G8929 Other chronic pain: Secondary | ICD-10-CM

## 2024-06-30 NOTE — Telephone Encounter (Signed)
 Please send her to [facility] or [facility] for L[facility] ESI.  Whichever is quicker.  Thanks.

## 2024-06-30 NOTE — Telephone Encounter (Signed)
 Addressed.

## 2024-07-04 ENCOUNTER — Encounter: Payer: Self-pay | Admitting: Internal Medicine

## 2024-07-04 MED ORDER — OXYCODONE-ACETAMINOPHEN 7.5-325 MG PO TABS: 1.0000 | ORAL_TABLET | ORAL | 0 refills | Status: DC | PRN

## 2024-07-11 ENCOUNTER — Encounter: Payer: Self-pay | Admitting: Internal Medicine

## 2024-07-14 ENCOUNTER — Encounter: Payer: Self-pay | Admitting: Orthopaedic Surgery

## 2024-07-14 NOTE — Discharge Instructions (Signed)

## 2024-07-16 ENCOUNTER — Inpatient Hospital Stay
Admission: RE | Admit: 2024-07-16 | Discharge: 2024-07-16 | Disposition: A | Source: Ambulatory Visit | Attending: Orthopaedic Surgery | Admitting: Orthopaedic Surgery

## 2024-07-16 ENCOUNTER — Telehealth: Payer: Self-pay | Admitting: Orthopaedic Surgery

## 2024-07-16 DIAGNOSIS — M5126 Other intervertebral disc displacement, lumbar region: Secondary | ICD-10-CM | POA: Diagnosis not present

## 2024-07-16 DIAGNOSIS — G8929 Other chronic pain: Secondary | ICD-10-CM

## 2024-07-16 MED ORDER — IOPAMIDOL (ISOVUE-M 200) INJECTION 41%
1.0000 mL | Freq: Once | INTRAMUSCULAR | Status: AC
  Administered 2024-07-16: 1 mL via EPIDURAL

## 2024-07-16 MED ORDER — METHYLPREDNISOLONE ACETATE 40 MG/ML INJ SUSP (RADIOLOG
80.0000 mg | Freq: Once | INTRAMUSCULAR | Status: AC
  Administered 2024-07-16: 80 mg via EPIDURAL

## 2024-07-16 NOTE — Telephone Encounter (Signed)
 Went straight to voicemail twice.  Mailbox full.

## 2024-07-16 NOTE — Telephone Encounter (Signed)
 Patient is scheduled for left total hip at [facility] on 08-18-24 .  She would like to speak with the doctor about her back pain and the appointment she had today.  She states she is now using crutches.  Best number to reach patient is [phone].

## 2024-07-17 ENCOUNTER — Encounter: Payer: Self-pay | Admitting: Orthopaedic Surgery

## 2024-07-17 ENCOUNTER — Encounter: Payer: Self-pay | Admitting: Internal Medicine

## 2024-07-17 DIAGNOSIS — S76312D Strain of muscle, fascia and tendon of the posterior muscle group at thigh level, left thigh, subsequent encounter: Secondary | ICD-10-CM

## 2024-07-17 MED ORDER — OXYCODONE-ACETAMINOPHEN 7.5-325 MG PO TABS: 1.0000 | ORAL_TABLET | ORAL | 0 refills | Status: DC | PRN

## 2024-07-18 NOTE — Telephone Encounter (Signed)
 Please send referral for PT for hamstring tear.  Thanks.

## 2024-07-18 NOTE — Addendum Note (Signed)
 Addended by: GEOFM GLADE PARAS on: 07/18/2024 03:41 PM   Modules accepted: Orders

## 2024-07-21 ENCOUNTER — Other Ambulatory Visit: Payer: Self-pay

## 2024-07-21 ENCOUNTER — Encounter: Payer: Self-pay | Admitting: Family Medicine

## 2024-07-21 ENCOUNTER — Ambulatory Visit: Admitting: Family Medicine

## 2024-07-21 VITALS — BP 130/82 | HR 75 | Ht 62.0 in

## 2024-07-21 DIAGNOSIS — M5416 Radiculopathy, lumbar region: Secondary | ICD-10-CM

## 2024-07-21 DIAGNOSIS — M79652 Pain in left thigh: Secondary | ICD-10-CM

## 2024-07-21 DIAGNOSIS — G8929 Other chronic pain: Secondary | ICD-10-CM

## 2024-07-21 NOTE — Patient Instructions (Addendum)
 Thank you for coming in today.   A referral for physical therapy has been submitted. A representative from the physical therapy office will contact you to coordinate scheduling after confirming your benefits with your insurance provider. If you do not hear from the physical therapy office within the next 1-2 weeks, please let us  know.   We've placed an order for a back injection.   Parking Placard provided today.   See you back as needed.

## 2024-07-21 NOTE — Progress Notes (Unsigned)
 I, Leotis Batter, CMA acting as a scribe for Artist Lloyd, MD.  Crystal Barnes is a 73 y.o. female who presents to [facility] today for left thigh / hamstring pain. Pt scheduled for L hip surgery 08/18/24.  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 Hx of L RLS. Saw Dr. Jerri for left hip pain, had MRI, advised to have hip replacement. Notes worsening difficulty with ambulation 3 days after hip MRI. Ambulating with crutches today.   Diagnostic Imaging  06/16/24 - MRI L-Spine  06/18/24 - MRI L Hip   Pertinent review of systems: No fevers or chills  Relevant historical information: Coronary artery disease.  Left hip arthritis.   Exam:  BP 130/82   Pulse 75   Ht 5' 2 (1.575 m)   LMP  (LMP Unknown)   SpO2 100%   BMI 19.22 kg/m  General: Well Developed, well nourished, and in no acute distress.   MSK: Left hip decreased range of motion.  Tender palpation posterior hip.    Lab and Radiology Results  EXAM: MR OF THE LEFT HIP WITHOUT CONTRAST   TECHNIQUE: Multiplanar, multisequence MR imaging was performed. No intravenous contrast was administered.   COMPARISON:  Radiographs 05/26/2024 and 06/03/2021.   FINDINGS: Bones: There is no evidence of acute fracture, dislocation or femoral head osteonecrosis. Aside from acetabular degenerative changes, the bony pelvis appears normal. There is multilevel lower lumbar spondylosis. The visualized sacroiliac joints and symphysis pubis appear normal. Stable dense globular calcification along the anterior inferior aspect of the symphysis pubis compared with radiographs dating back to 06/03/2021.   Articular cartilage and labrum   Articular cartilage: Advanced left hip osteoarthritis with superolateral migration of the left femoral head, diffuse chondral thinning and subchondral edema throughout the left acetabulum, femoral head and neck. These  findings have progressed from the 06/03/2021 radiographs. Mild right hip degenerative changes with subchondral cyst formation peripherally in the right acetabulum.   Labrum: Diffuse degenerative tearing of the left acetabular labrum.   Joint or bursal effusion   Joint effusion: Moderate-sized left hip joint effusion with synovial irregularity. No significant right hip joint effusion.   Bursae: No focal periarticular fluid collection.   Muscles and tendons   Muscles and tendons: There is a high-grade tear of the left common hamstring tendon. The right common hamstring, bilateral iliopsoas and gluteus tendons appear intact. There is mild edema within the left hip adductor musculature. No focal muscular atrophy.   Other findings   Miscellaneous: Status post hysterectomy. The visualized internal pelvic contents are otherwise unremarkable.   IMPRESSION: 1. Advanced left hip osteoarthritis with diffuse chondral thinning, subchondral edema and a moderate-sized joint effusion. Associated degenerative labral tearing. These findings have progressed from radiographs dating back to 06/03/2021. 2. Mild right hip degenerative changes. 3. High-grade tear of the left common hamstring tendon. 4. Multilevel lower lumbar spondylosis.     Electronically Signed   By: Elsie Perone M.D.   On: 06/25/2024 15:27  EXAM: MRI LUMBAR SPINE WITHOUT CONTRAST   TECHNIQUE: Multiplanar, multisequence MR imaging of the lumbar spine was performed. No intravenous contrast was administered.   COMPARISON:  None Available.   FINDINGS: Segmentation: Standard. Lowest well-formed disc space labeled the L5-S1 level.   Alignment: Straightening of the normal lumbar lordosis with underlying trace levoscoliosis. Trace degenerative anterolisthesis L4 on L5 and L5 on S1.   Vertebrae: Vertebral body height maintained without acute or chronic  fracture. Bone marrow signal intensity within normal limits.  No worrisome osseous lesions. Mild scattered degenerative reactive endplate changes noted throughout the lumbar spine. No other abnormal marrow edema.   Conus medullaris and cauda equina: Conus extends to the T12-L1 level. Conus and cauda equina appear normal.   Paraspinal and other soft tissues: Unremarkable.   Disc levels:   L1-2: Degenerative intervertebral disc space narrowing with diffuse disc bulge and disc desiccation. Reactive endplate spurring. Mild facet hypertrophy. No spinal stenosis. Foramina remain patent.   L2-3: Degenerative intervertebral disc space narrowing with disc desiccation diffuse disc bulge. Reactive endplate spurring. Changes are asymmetric to the left with a superimposed small left foraminal to extraforaminal disc protrusion (series 107, image 11). The exiting left L2 nerve root could potentially be affected. Mild facet hypertrophy. No significant spinal stenosis. Mild left L2 foraminal narrowing. Right neural foramen remains patent.   L3-4: Disc desiccation with mild disc bulge. Mild facet hypertrophy. No spinal stenosis. Mild bilateral L3 foraminal narrowing.   L4-5: Trace anterolisthesis. Disc desiccation with mild diffuse disc bulge, asymmetric to the left. Reactive endplate spurring, also greater on the left. Moderate bilateral facet hypertrophy. Resultant mild narrowing of the left lateral recess. Central canal remains patent. Mild to moderate bilateral L4 foraminal stenosis.   L5-S1: Trace anterolisthesis. Disc desiccation with intervertebral disc space narrowing and diffuse disc bulge. Reactive endplate spurring. Mild facet hypertrophy. No spinal stenosis. Moderate bilateral L5 foraminal narrowing.   IMPRESSION: 1. Small left foraminal to extraforaminal disc protrusion at L2-3, potentially affecting the exiting left L2 nerve root. 2. Moderate bilateral L5 foraminal stenosis related to disc bulge, reactive endplate spurring, and facet  hypertrophy. 3. Otherwise relatively mild spondylosis elsewhere within the lumbar spine for patient age as above. No other significant stenosis or overt neural impingement.     Electronically Signed   By: Morene Hoard M.D.   On: 06/22/2024 20:19  I, Artist Lloyd, personally (independently) visualized and performed the interpretation of the images attached in this note.      Assessment and Plan: 73 y.o. female with multifactorial left hip pain.  She does have severe arthritis which is Contrave rating greatly to her overall hip pain especially in the anterior hip.  However she does have a hamstring tear in the posterior left hip that is making ambulation difficult.  She is scheduled to have a hip replacement in about 4 weeks on November 10.  Will go ahead and get her into physical therapy now.  Still think physical therapy should be able to get her in much sooner.  Will go ahead and place a referral order now.  Additionally she has lumbar radiculopathy.  She recently had an epidural steroid injection targeting the left L5 nerve.  Based on her current MRI the left L2 nerve could be a factor as well.  Will go ahead and work on that and order new epidural steroid injection for the left L2 nerve.    PDMP not reviewed this encounter. Orders Placed This Encounter  Procedures   US  LIMITED JOINT SPACE STRUCTURES LOW LEFT(NO LINKED CHARGES)    Reason for Exam (SYMPTOM  OR DIAGNOSIS REQUIRED):   left thigh pain    Preferred imaging location?:   [facility] DIAG/THERA/INC NEEDLE/CATH/PLC EPI/LUMB/SAC W/IMG    Level and technique per radiology - targeting L2    Standing Status:   Future    Expiration Date:   08/21/2024    Reason for Exam (SYMPTOM  OR DIAGNOSIS REQUIRED):   Low back pain    Preferred Imaging Location?:   [facility]   Ambulatory referral to Physical Therapy    Referral Priority:   Routine    Referral Type:   Physical Medicine     Referral Reason:   Specialty Services Required    Requested Specialty:   Physical Therapy    Number of Visits Requested:   1   No orders of the defined types were placed in this encounter.    Discussed warning signs or symptoms. Please see discharge instructions. Patient expresses understanding.   The above documentation has been reviewed and is accurate and complete Artist Lloyd, M.D.

## 2024-07-23 ENCOUNTER — Ambulatory Visit: Payer: Medicare Other

## 2024-07-25 ENCOUNTER — Encounter: Payer: Self-pay | Admitting: Orthopaedic Surgery

## 2024-07-25 ENCOUNTER — Encounter: Payer: Self-pay | Admitting: Internal Medicine

## 2024-07-25 ENCOUNTER — Other Ambulatory Visit: Payer: Self-pay | Admitting: Medical Genetics

## 2024-07-25 DIAGNOSIS — Z006 Encounter for examination for normal comparison and control in clinical research program: Secondary | ICD-10-CM

## 2024-07-26 ENCOUNTER — Encounter: Payer: Self-pay | Admitting: Family Medicine

## 2024-07-28 ENCOUNTER — Telehealth: Payer: Self-pay

## 2024-07-28 DIAGNOSIS — M25552 Pain in left hip: Secondary | ICD-10-CM

## 2024-07-28 NOTE — Telephone Encounter (Signed)
 Does this mean anything to anyone?

## 2024-07-28 NOTE — Telephone Encounter (Signed)
 Copied from CRM #[id number]. Topic: Referral - Request for Referral >> Jul 28, 2024  4:23 PM Zebedee SAUNDERS wrote: Did the patient discuss referral with their provider in the last year? Yes (If No - schedule appointment) (If Yes - send message)  Appointment offered? Yes  Type of order/referral and detailed reason for visit: [facility] Left hip socket, pre op therapy.   [email or web address] Physical therapy in [place], [place] [address] [place], [place], [place] [address]  Ph: [phone] faxL [phone]  If referral order, have you been seen by this specialty before? No (If Yes, this issue or another issue? When? Where?  Can we respond through MyChart? No

## 2024-07-29 NOTE — Telephone Encounter (Signed)
 Should ideally be referred by Ortho since they may have specifications since it is preop

## 2024-07-30 ENCOUNTER — Encounter: Payer: Self-pay | Admitting: Internal Medicine

## 2024-07-30 DIAGNOSIS — Z713 Dietary counseling and surveillance: Secondary | ICD-10-CM

## 2024-07-30 NOTE — Telephone Encounter (Signed)
 Referral sent today

## 2024-07-31 ENCOUNTER — Other Ambulatory Visit

## 2024-08-03 ENCOUNTER — Encounter: Payer: Self-pay | Admitting: Internal Medicine

## 2024-08-03 MED ORDER — OXYCODONE-ACETAMINOPHEN 7.5-325 MG PO TABS: 1.0000 | ORAL_TABLET | ORAL | 0 refills | Status: DC | PRN

## 2024-08-05 ENCOUNTER — Other Ambulatory Visit: Payer: Self-pay | Admitting: Physician Assistant

## 2024-08-05 ENCOUNTER — Telehealth: Payer: Self-pay | Admitting: Orthopaedic Surgery

## 2024-08-05 MED ORDER — ASPIRIN 81 MG PO CHEW: 81.0000 mg | CHEWABLE_TABLET | Freq: Two times a day (BID) | ORAL | 0 refills | Status: AC

## 2024-08-05 MED ORDER — DOCUSATE SODIUM 100 MG PO CAPS
100.0000 mg | ORAL_CAPSULE | Freq: Every day | ORAL | 2 refills | Status: DC | PRN
  Filled 2024-08-27: qty 30, 30d supply, fill #0

## 2024-08-05 MED ORDER — ONDANSETRON HCL 4 MG PO TABS: 4.0000 mg | ORAL_TABLET | Freq: Three times a day (TID) | ORAL | 0 refills | Status: DC | PRN

## 2024-08-05 MED ORDER — METHOCARBAMOL 500 MG PO TABS
500.0000 mg | ORAL_TABLET | Freq: Two times a day (BID) | ORAL | 2 refills | Status: DC | PRN
  Filled 2024-08-27: qty 20, 10d supply, fill #0
  Filled 2024-09-02 – 2024-09-09 (×2): qty 20, 10d supply, fill #1
  Filled 2024-09-22: qty 20, 10d supply, fill #2

## 2024-08-05 NOTE — Telephone Encounter (Signed)
 Patient called and said she you to fax over to [facility] ( the medical supply). 2 in one Tolilet seat [id number] [phone],  Shower stool [id number] [facility]# [id number] and the Fax#[phone] CB#[phone]

## 2024-08-08 ENCOUNTER — Other Ambulatory Visit

## 2024-08-08 NOTE — Discharge Instructions (Signed)

## 2024-08-11 ENCOUNTER — Other Ambulatory Visit

## 2024-08-11 ENCOUNTER — Encounter: Payer: Self-pay | Admitting: Radiology

## 2024-08-11 ENCOUNTER — Ambulatory Visit
Admission: RE | Admit: 2024-08-11 | Discharge: 2024-08-11 | Disposition: A | Discharge status: Home / Self Care | Source: Ambulatory Visit | Attending: Family Medicine | Admitting: Family Medicine

## 2024-08-11 ENCOUNTER — Other Ambulatory Visit: Payer: Self-pay | Admitting: Family Medicine

## 2024-08-11 DIAGNOSIS — M5416 Radiculopathy, lumbar region: Secondary | ICD-10-CM

## 2024-08-11 DIAGNOSIS — M79652 Pain in left thigh: Secondary | ICD-10-CM

## 2024-08-11 MED ORDER — METHYLPREDNISOLONE ACETATE 40 MG/ML INJ SUSP (RADIOLOG
80.0000 mg | Freq: Once | INTRAMUSCULAR | Status: AC
  Administered 2024-08-11: 80 mg via EPIDURAL

## 2024-08-11 MED ORDER — IOPAMIDOL (ISOVUE-M 200) INJECTION 41%
1.0000 mL | Freq: Once | INTRAMUSCULAR | Status: AC
  Administered 2024-08-11: 1 mL via EPIDURAL

## 2024-08-12 NOTE — Progress Notes (Signed)
 Surgical Instructions   Your procedure is scheduled on Monday, November 10th, 2025. Report to [facility] Main Entrance A at 7:30 A.M., then check in with the Admitting office. Any questions or running late day of surgery: call [phone]  Questions prior to your surgery date: call [phone], Monday-Friday, 8am-4pm. If you experience any cold or flu symptoms such as cough, fever, chills, shortness of breath, etc. between now and your scheduled surgery, please notify us  at the above number.     Remember:  Do not eat after midnight the night before your surgery  You may drink clear liquids until 7:00 the morning of your surgery.   Clear liquids allowed are: Water, Non-Citrus Juices (without pulp), Carbonated Beverages, Clear Tea (no milk, honey, etc.), Black Coffee Only (NO MILK, CREAM OR POWDERED CREAMER of any kind), and Gatorade.  Patient Instructions  The night before surgery:  No food after midnight. ONLY clear liquids after midnight  The day of surgery (if you do NOT have diabetes):  Drink ONE (1) Pre-Surgery Clear Ensure by 7:00 the morning of surgery. Drink in one sitting. Do not sip.  This drink was given to you during your hospital  pre-op appointment visit.  Nothing else to drink after completing the  Pre-Surgery Clear Ensure.          If you have questions, please contact your surgeon's office.     Take these medicines the morning of surgery with A SIP OF WATER: Gabapentin  (Neurontin ) Levothyroxine  (Synthroid ) Rosuvastatin  (Crestor )   May take these medicines IF NEEDED: Oxycodone -acetaminophen  (Percocet)   Per your cardiologist, Aspirin  can be held for 7 days prior to your procedure.  Your last dose of Aspirin  should be on Sunday, November 2nd.   One week prior to surgery, STOP taking any Aleve, Naproxen, Ibuprofen, Motrin, Advil, Goody's, BC's, all herbal medications, fish oil, and non-prescription vitamins.                     Do NOT Smoke  (Tobacco/Vaping) for 24 hours prior to your procedure.  If you use a CPAP at night, you may bring your mask/headgear for your overnight stay.   You will be asked to remove any contacts, glasses, piercing's, hearing aid's, dentures/partials prior to surgery. Please bring cases for these items if needed.    Patients discharged the day of surgery will not be allowed to drive home, and someone needs to stay with them for 24 hours.  SURGICAL WAITING ROOM VISITATION Patients may have no more than 2 support people in the waiting area - these visitors may rotate.   Pre-op nurse will coordinate an appropriate time for 1 ADULT support person, who may not rotate, to accompany patient in pre-op.  Children under the age of 27 must have an adult with them who is not the patient and must remain in the main waiting area with an adult.  If the patient needs to stay at the hospital during part of their recovery, the visitor guidelines for inpatient rooms apply.  Please refer to the [facility] website for the visitor guidelines for any additional information.   If you received a COVID test during your pre-op visit  it is requested that you wear a mask when out in public, stay away from anyone that may not be feeling well and notify your surgeon if you develop symptoms. If you have been in contact with anyone that has tested positive in the last 10 days please notify you surgeon.  Pre-operative 4 CHG Bathing Instructions   You can play a key role in reducing the risk of infection after surgery. Your skin needs to be as free of germs as possible. You can reduce the number of germs on your skin by washing with CHG (chlorhexidine gluconate) soap before surgery. CHG is an antiseptic soap that kills germs and continues to kill germs even after washing.   DO NOT use if you have an allergy to chlorhexidine/CHG or antibacterial soaps. If your skin becomes reddened or irritated, stop using the CHG and notify one  of our RNs at [phone].   Please shower with the CHG soap starting 4 days before surgery using the following schedule:     Please keep in mind the following:  DO NOT shave, including legs and underarms, starting the day of your first shower.   You may shave your face at any point before/day of surgery.  Place clean sheets on your bed the day you start using CHG soap. Use a clean washcloth (not used since being washed) for each shower. DO NOT sleep with pets once you start using the CHG.   CHG Shower Instructions:  Wash your face and private area with normal soap. If you choose to wash your hair, wash first with your normal shampoo.  After you use shampoo/soap, rinse your hair and body thoroughly to remove shampoo/soap residue.  Turn the water OFF and apply  bottle of CHG soap to a CLEAN washcloth.  Apply CHG soap ONLY FROM YOUR NECK DOWN TO YOUR TOES (washing for 3-5 minutes)  DO NOT use CHG soap on face, private areas, open wounds, or sores.  Pay special attention to the area where your surgery is being performed.  If you are having back surgery, having someone wash your back for you may be helpful. Wait 2 minutes after CHG soap is applied, then you may rinse off the CHG soap.  Pat dry with a clean towel  Put on clean clothes/pajamas   If you choose to wear lotion, please use ONLY the CHG-compatible lotions that are listed below.  Additional instructions for the day of surgery:  If you choose, you may shower the morning of surgery with an antibacterial soap.  DO NOT APPLY any lotions, deodorants, cologne, or perfumes.   Do not bring valuables to the hospital. [facility] is not responsible for any belongings/valuables. Do not wear nail polish, gel polish, artificial nails, or any other type of covering on natural nails (fingers and toes) Do not wear jewelry or makeup Put on clean/comfortable clothes.  Please brush your teeth.  Ask your nurse before applying any prescription  medications to the skin.     CHG Compatible Lotions   Aveeno Moisturizing lotion  Cetaphil Moisturizing Cream  Cetaphil Moisturizing Lotion  Clairol Herbal Essence Moisturizing Lotion, Dry Skin  Clairol Herbal Essence Moisturizing Lotion, Extra Dry Skin  Clairol Herbal Essence Moisturizing Lotion, Normal Skin  Curel Age Defying Therapeutic Moisturizing Lotion with Alpha Hydroxy  Curel Extreme Care Body Lotion  Curel Soothing Hands Moisturizing Hand Lotion  Curel Therapeutic Moisturizing Cream, Fragrance-Free  Curel Therapeutic Moisturizing Lotion, Fragrance-Free  Curel Therapeutic Moisturizing Lotion, Original Formula  Eucerin Daily Replenishing Lotion  Eucerin Dry Skin Therapy Plus Alpha Hydroxy Crme  Eucerin Dry Skin Therapy Plus Alpha Hydroxy Lotion  Eucerin Original Crme  Eucerin Original Lotion  Eucerin Plus Crme Eucerin Plus Lotion  Eucerin TriLipid Replenishing Lotion  Keri Anti-Bacterial Hand Lotion  Keri Deep Conditioning Original Lotion Dry  Skin Formula Softly Scented  Keri Deep Conditioning Original Lotion, Fragrance Free Sensitive Skin Formula  Keri Lotion Fast Absorbing Fragrance Free Sensitive Skin Formula  Keri Lotion Fast Absorbing Softly Scented Dry Skin Formula  Keri Original Lotion  Keri Skin Renewal Lotion Keri Silky Smooth Lotion  Keri Silky Smooth Sensitive Skin Lotion  Nivea Body Creamy Conditioning Oil  Nivea Body Extra Enriched Lotion  Nivea Body Original Lotion  Nivea Body Sheer Moisturizing Lotion Nivea Crme  Nivea Skin Firming Lotion  NutraDerm 30 Skin Lotion  NutraDerm Skin Lotion  NutraDerm Therapeutic Skin Cream  NutraDerm Therapeutic Skin Lotion  ProShield Protective Hand Cream  Provon moisturizing lotion  Please read over the following fact sheets that you were given.

## 2024-08-13 ENCOUNTER — Other Ambulatory Visit: Payer: Self-pay

## 2024-08-13 ENCOUNTER — Encounter (HOSPITAL_COMMUNITY): Payer: Self-pay | Admitting: Urology

## 2024-08-13 ENCOUNTER — Encounter: Payer: Self-pay | Admitting: Internal Medicine

## 2024-08-13 ENCOUNTER — Encounter (HOSPITAL_COMMUNITY)
Admission: RE | Admit: 2024-08-13 | Discharge: 2024-08-13 | Disposition: A | Discharge status: Home / Self Care | Source: Ambulatory Visit | Attending: Orthopaedic Surgery | Admitting: Orthopaedic Surgery

## 2024-08-13 VITALS — BP 157/87 | HR 75 | Temp 98.7°F | Resp 16 | Ht 62.0 in | Wt 103.5 lb

## 2024-08-13 DIAGNOSIS — Z87891 Personal history of nicotine dependence: Secondary | ICD-10-CM | POA: Diagnosis not present

## 2024-08-13 DIAGNOSIS — Z01812 Encounter for preprocedural laboratory examination: Secondary | ICD-10-CM | POA: Insufficient documentation

## 2024-08-13 DIAGNOSIS — Z01818 Encounter for other preprocedural examination: Secondary | ICD-10-CM

## 2024-08-13 DIAGNOSIS — L89152 Pressure ulcer of sacral region, stage 2: Secondary | ICD-10-CM | POA: Diagnosis not present

## 2024-08-13 DIAGNOSIS — E785 Hyperlipidemia, unspecified: Secondary | ICD-10-CM | POA: Insufficient documentation

## 2024-08-13 DIAGNOSIS — I10 Essential (primary) hypertension: Secondary | ICD-10-CM | POA: Insufficient documentation

## 2024-08-13 DIAGNOSIS — G2581 Restless legs syndrome: Secondary | ICD-10-CM | POA: Diagnosis not present

## 2024-08-13 DIAGNOSIS — L89151 Pressure ulcer of sacral region, stage 1: Secondary | ICD-10-CM | POA: Insufficient documentation

## 2024-08-13 DIAGNOSIS — Z7982 Long term (current) use of aspirin: Secondary | ICD-10-CM | POA: Diagnosis not present

## 2024-08-13 DIAGNOSIS — I2510 Atherosclerotic heart disease of native coronary artery without angina pectoris: Secondary | ICD-10-CM | POA: Diagnosis not present

## 2024-08-13 DIAGNOSIS — I252 Old myocardial infarction: Secondary | ICD-10-CM | POA: Insufficient documentation

## 2024-08-13 DIAGNOSIS — Z955 Presence of coronary angioplasty implant and graft: Secondary | ICD-10-CM | POA: Insufficient documentation

## 2024-08-13 DIAGNOSIS — M1612 Unilateral primary osteoarthritis, left hip: Secondary | ICD-10-CM

## 2024-08-13 DIAGNOSIS — E039 Hypothyroidism, unspecified: Secondary | ICD-10-CM | POA: Insufficient documentation

## 2024-08-13 DIAGNOSIS — Z79891 Long term (current) use of opiate analgesic: Secondary | ICD-10-CM | POA: Insufficient documentation

## 2024-08-13 DIAGNOSIS — Z79631 Long term (current) use of antimetabolite agent: Secondary | ICD-10-CM | POA: Diagnosis not present

## 2024-08-13 DIAGNOSIS — M160 Bilateral primary osteoarthritis of hip: Secondary | ICD-10-CM | POA: Insufficient documentation

## 2024-08-13 HISTORY — DX: Restless legs syndrome: G25.81

## 2024-08-13 LAB — BASIC METABOLIC PANEL WITH GFR
Anion gap: 12 (ref 5–15)
BUN: 23 mg/dL (ref 8–23)
CO2: 25 mmol/L (ref 22–32)
Calcium: 9.7 mg/dL (ref 8.9–10.3)
Chloride: 98 mmol/L (ref 98–111)
Creatinine, Ser: 0.51 mg/dL (ref 0.44–1.00)
GFR, Estimated: >60 mL/min (ref >60)
Glucose, Bld: 101 mg/dL — ABNORMAL HIGH (ref 70–99)
Potassium: 3.4 mmol/L — ABNORMAL LOW (ref 3.5–5.1)
Sodium: 135 mmol/L (ref 135–145)

## 2024-08-13 LAB — CBC
HCT: 43.9 % (ref 36.0–46.0)
Hemoglobin: 15.3 g/dL — ABNORMAL HIGH (ref 12.0–15.0)
MCH: 30.8 pg (ref 26.0–34.0)
MCHC: 34.9 g/dL (ref 30.0–36.0)
MCV: 88.3 fL (ref 80.0–100.0)
Platelets: 422 K/uL — ABNORMAL HIGH (ref 150–400)
RBC: 4.97 MIL/uL (ref 3.87–5.11)
RDW: 13.1 % (ref 11.5–15.5)
WBC: 9.6 K/uL (ref 4.0–10.5)
nRBC: 0 % (ref 0.0–0.2)

## 2024-08-13 LAB — TYPE AND SCREEN
ABO/RH(D): A POS
Antibody Screen: NEGATIVE

## 2024-08-13 LAB — SURGICAL PCR SCREEN
MRSA, PCR: NEGATIVE
Staphylococcus aureus: NEGATIVE

## 2024-08-13 NOTE — Progress Notes (Signed)
 PCP - [facility] Cardiologist - Mihai Croitoru  PPM/ICD - denies   Chest x-ray - N/A EKG - 06/10/24 Stress Test - 01/06/21 ECHO - 01/04/21 Cardiac Cath - 2014  Sleep Study - denies   Fasting Blood Sugar - N/A  Last dose of GLP1 agonist-  N/A   Blood Thinner Instructions: denies Aspirin  Instructions: pt reports she has been off of ASA for a week now.   ERAS Protcol - ERAS + ensure   COVID TEST- N/A  Pt repots bed sore to sacrum. Sore noted to sacrum with small open area, pink surrounding. No drainage noted. Pt made appointment with PCP for tomorrow to assess sore and will call Dr. Benjiman office notify of this.   Anesthesia review: yes- heart history  Patient denies shortness of breath, fever, cough and chest pain at PAT appointment   All instructions explained to the patient, with a verbal understanding of the material. Patient agrees to go over the instructions while at home for a better understanding.  The opportunity to ask questions was provided.

## 2024-08-13 NOTE — Progress Notes (Signed)
 Subjective:    Patient ID: Crystal Barnes, female    DOB: 11/16/1950, 73 y.o.   MRN: [id number]      HPI [name] is here for No chief complaint on file.    Protein supplement Hydrocolloid or foam dressing Stage II-IV ulcers-electrical stimulation Air pad    Medications and allergies reviewed with patient and updated if appropriate.  Current Outpatient Medications on File Prior to Visit  Medication Sig Dispense Refill   docusate sodium (COLACE) 100 MG capsule Take 1 capsule (100 mg total) by mouth daily as needed. 30 capsule 2   methocarbamol  (ROBAXIN ) 500 MG tablet Take 1 tablet (500 mg total) by mouth 2 (two) times daily as needed. To be taken after surgery 20 tablet 2   ondansetron (ZOFRAN) 4 MG tablet Take 1 tablet (4 mg total) by mouth every 8 (eight) hours as needed for nausea or vomiting. 40 tablet 0   aspirin  (ASPIRIN  81) 81 MG chewable tablet Chew 1 tablet (81 mg total) by mouth 2 (two) times daily. To be taken after surgery to prevent blood clots 84 tablet 0   clonazePAM  (KLONOPIN ) 0.5 MG tablet TAKE 1 TO 2 TABLETS BY MOUTH EVERY NIGHT AT BEDTIME (Patient taking differently: Take 1 mg by mouth at bedtime.) 60 tablet 3   Evolocumab  (REPATHA  SURECLICK) 140 MG/ML SOAJ Inject 140 mg into the skin every 14 (fourteen) days. 6 mL 1   ferrous sulfate 325 (65 FE) MG EC tablet Take 325 mg by mouth daily.     gabapentin  (NEURONTIN ) 300 MG capsule TAKE 1 CAPSULE BY MOUTH 3 TIMES A DAY 270 capsule 0   hydrochlorothiazide  (HYDRODIURIL ) 25 MG tablet TAKE 1 TABLET BY MOUTH DAILY 90 tablet 3   levothyroxine  (SYNTHROID ) 88 MCG tablet TAKE 1 TABLET BY MOUTH DAILY BEFORE BREAKFAST 90 tablet 3   MAGNESIUM PO Take 500 mg by mouth daily with supper.     Multiple Minerals-Vitamins (CALCIUM  & VIT D3 BONE HEALTH PO) Take 1,200 mg by mouth daily.     Multiple Vitamin (MULTIVITAMIN WITH MINERALS) TABS tablet Take 1 tablet by mouth daily.     nitroGLYCERIN  (NITROSTAT ) 0.4 MG SL tablet Place 1  tablet (0.4 mg total) under the tongue every 5 (five) minutes as needed for chest pain. (Patient not taking: Reported on 08/12/2024) 25 tablet 3   oxyCODONE -acetaminophen  (PERCOCET) 7.5-325 MG tablet Take 1 tablet by mouth every 4 (four) hours as needed for severe pain (pain score 7-10) (chronic hip pain). 60 tablet 0   Polyethyl Glycol-Propyl Glycol (SYSTANE OP) Place 1 drop into both eyes daily as needed (Dry eyes).      PSYLLIUM PO Take 15 mLs by mouth at bedtime.     rOPINIRole  (REQUIP ) 1 MG tablet TAKE 3 TO 4 TABLETS BY MOUTH AT BEDTIME 360 tablet 2   rosuvastatin  (CRESTOR ) 5 MG tablet TAKE 1 TABLET BY MOUTH DAILY 90 tablet 3   No current facility-administered medications on file prior to visit.    Review of Systems     Objective:  There were no vitals filed for this visit. BP Readings from Last 3 Encounters:  08/13/24 (!) 157/87  08/11/24 (!) 168/89  07/21/24 130/82   Wt Readings from Last 3 Encounters:  08/13/24 103 lb 8 oz (46.9 kg)  06/10/24 105 lb 1.6 oz (47.7 kg)  06/03/24 106 lb (48.1 kg)   There is no height or weight on file to calculate BMI.    Physical Exam  Assessment & Plan:    See Problem List for Assessment and Plan of chronic medical problems.

## 2024-08-14 ENCOUNTER — Encounter (HOSPITAL_COMMUNITY): Payer: Self-pay

## 2024-08-14 ENCOUNTER — Ambulatory Visit: Admitting: Internal Medicine

## 2024-08-14 VITALS — BP 140/62 | HR 77 | Temp 98.1°F | Ht 62.0 in | Wt 102.0 lb

## 2024-08-14 DIAGNOSIS — G8929 Other chronic pain: Secondary | ICD-10-CM | POA: Diagnosis not present

## 2024-08-14 DIAGNOSIS — M25552 Pain in left hip: Secondary | ICD-10-CM

## 2024-08-14 DIAGNOSIS — L98429 Non-pressure chronic ulcer of back with unspecified severity: Secondary | ICD-10-CM | POA: Diagnosis not present

## 2024-08-14 DIAGNOSIS — L89151 Pressure ulcer of sacral region, stage 1: Secondary | ICD-10-CM

## 2024-08-14 DIAGNOSIS — I10 Essential (primary) hypertension: Secondary | ICD-10-CM

## 2024-08-14 MED ORDER — OXYCODONE-ACETAMINOPHEN 7.5-325 MG PO TABS: 1.0000 | ORAL_TABLET | ORAL | 0 refills | Status: DC | PRN

## 2024-08-14 NOTE — Assessment & Plan Note (Signed)
 Acute Stage I and stage II pressure ulcers central upper buttock region-present for approximately 2 weeks She has been sitting more because of hip pain and hamstring pain and will be having hip replacement in a few days Continue use of air pad Can continue to use Vaseline or Medihoney Continue multivitamin and good protein intake-stressed good nutrition Discussed the only thing that will help this heal is to avoid pressure on it-does not really matter what she puts on it Okay to put a sacral bandage

## 2024-08-14 NOTE — Assessment & Plan Note (Signed)
 Chronic Blood pressure slightly elevated and has been recently which I believe is secondary to significant pain from her severe left hip arthritis Continue hydrochlorothiazide  25 mg daily If BP remains high after surgery will consider adjusting medication

## 2024-08-14 NOTE — Assessment & Plan Note (Signed)
 Chronic Severe osteoarthritis-awaiting hip replacement-to be done in 4 days Continue Percocet 7.5-325 mg-1 tab every 4 hours as needed for severe pain Continue methocarbamol  as needed for muscle spasms After surgery we can decrease her Percocet slowly as she starts to do physical therapy and hopefully her pain will be much improved

## 2024-08-14 NOTE — Patient Instructions (Addendum)
      You can apply a hydrocolloid pad.  You can apply vaseline or medihoney.     Medications changes include :   None

## 2024-08-14 NOTE — Progress Notes (Signed)
 Anesthesia Chart Review:  Case: [id number] Date/Time: 08/18/24 0945   Procedure: ARTHROPLASTY, HIP, TOTAL, ANTERIOR APPROACH (Left: Hip) - 3-C   Anesthesia type: Spinal   Diagnosis: Primary osteoarthritis of left hip [M16.12]   Pre-op diagnosis: left hip osteoarthritis   [facility]: [facility] ROOM 09 / [facility]   Surgeons: Jerri Kay HERO, MD       DISCUSSION: Patient is a 73 year old female scheduled for the above procedure.   History includes former smoker, HTN, HLD, CAD (NSTEMI 06/01/2013, s/p DES mRCA, 60% mLAD treated medically), hypothyroidism, RLS.   She had cardiology follow-up and preoperative evaluation on 06/10/2024 by Vannie Mora, NP. Denied CV symptoms. HTN controlled by home readings. In regards to preoperative recommendations, she wrote, According to the Revised Cardiac Risk Index (RCRI), her Perioperative Risk of Major Cardiac Event is (%): 6.6. Her Functional Capacity in METs is: 5.62 according to the Duke Activity Status Index (DASI). Per AHA/ACC guidelines, she is deemed acceptable risk for the planned procedure without additional cardiovascular testing. Will route to surgical team so they are aware.  May hold aspirin  7 days prior to planned procedure.   Patient had primary care visit on 08/14/2024 for developing pressure sacral ulcer. Lesions described as stage 1 and stage 2 on left side of central upper buttock. She has had difficulty relieving pressure from the area due to her inability to lay down or stand for extended periods from her hip pain. She has been using an air pad to try to relieve pressure and has tried to increase her protein intake. Dr. Geofm noted surgery plans for Monday 08/18/2024. Advised avoiding pressure on the areas, and can use Vaseline, Medihoney, and/or hydrocolloid pad. Hopefully THA will help with her mobility. Continue Percocet and methocarbamol  as needed. Will send communication to Dr. Jerri to review. Patient had also been asked to notify Dr. Benjiman office.  (UPDATE 08/15/2024 9:22 AM: Received confirmation that Dr. Jerri is aware. Ms. Wingert had already been in touch with him.)  ASA is on hold for surgery.   Anesthesia team to evaluate on the day of surgery. (UPDATE:    VS: BP (!) 157/87   Pulse 75   Temp 37.1 C   Resp 16   Ht 5' 2 (1.575 m)   Wt 46.9 kg   LMP  (LMP Unknown)   SpO2 98%   BMI 18.93 kg/m    PROVIDERS: Geofm Glade PARAS, MD is PCP  Croitoru, Jerel, MD is cardiologist    LABS: Labs reviewed: Acceptable for surgery. (all labs ordered are listed, but only abnormal results are displayed)  Labs Reviewed  CBC - Abnormal; Notable for the following components:      Result Value   Hemoglobin 15.3 (*)    Platelets 422 (*)    All other components within normal limits  BASIC METABOLIC PANEL WITH GFR - Abnormal; Notable for the following components:   Potassium 3.4 (*)    Glucose, Bld 101 (*)    All other components within normal limits  SURGICAL PCR SCREEN  TYPE AND SCREEN     IMAGES: MRI Left Hip 06/18/2024: IMPRESSION: 1. Advanced left hip osteoarthritis with diffuse chondral thinning, subchondral edema and a moderate-sized joint effusion. Associated degenerative labral tearing. These findings have progressed from radiographs dating back to 06/03/2021. 2. Mild right hip degenerative changes. 3. High-grade tear of the left common hamstring tendon. 4. Multilevel lower lumbar spondylosis.   MRI L-spine 06/16/2024: IMPRESSION: 1. Small left foraminal to extraforaminal disc  protrusion at L2-3, potentially affecting the exiting left L2 nerve root. 2. Moderate bilateral L5 foraminal stenosis related to disc bulge, reactive endplate spurring, and facet hypertrophy. 3. Otherwise relatively mild spondylosis elsewhere within the lumbar spine for patient age as above. No other significant stenosis or overt neural impingement.    EKG: 06/10/2024: Normal sinus rhythm at 66 bpm   CV: ETT 01/06/2021: Exercise stress test:  Clinically and electrically negative for ischemia Excellent exercise tolerance Normal exercise stress test  Echo 01/04/2021: IMPRESSIONS   1. Left ventricular ejection fraction by 3D volume is 68 %. The left  ventricle has normal function. The left ventricle has no regional wall  motion abnormalities. Left ventricular diastolic parameters were normal.  The average left ventricular global  longitudinal strain is -22.1 %. The global longitudinal strain is normal.   2. Right ventricular systolic function is normal. The right ventricular  size is normal. There is normal pulmonary artery systolic pressure. The  estimated right ventricular systolic pressure is 21.7 mmHg.   3. The mitral valve is normal in structure. Trivial mitral valve  regurgitation. No evidence of mitral stenosis.   4. The aortic valve is tricuspid. There is mild calcification of the  aortic valve. Aortic valve regurgitation is not visualized. No aortic  stenosis is present.   5. The inferior vena cava is normal in size with greater than 50%  respiratory variability, suggesting right atrial pressure of 3 mmHg.  - Conclusion(s)/Recommendation(s): Normal biventricular function without evidence of hemodynamically significant valvular heart disease.  - Comparison TTE in setting of acute NSTEMI Echocardiogram June 02, 2013: EF 65-70 percent, hypokinesis of the inferior lateral wall   Past Medical History:  Diagnosis Date   Arthritis    CAD (coronary artery disease)    Cataract    bilateral-removed   Hyperlipidemia    Hypertension    Hypothyroidism    Myocardial infarction (HCC)    Restless leg syndrome    Thyroid  disease     Past Surgical History:  Procedure Laterality Date   ABDOMINAL HYSTERECTOMY  2008   CATARACT EXTRACTION Bilateral    COLONOSCOPY     CORONARY STENT PLACEMENT  2014   ENDOSCOPIC MUCOSAL RESECTION N/A 11/24/2019   Procedure: ENDOSCOPIC MUCOSAL RESECTION;  Surgeon: Wilhelmenia Aloha Raddle.,  MD;  [facility] ENDOSCOPY;  Service: Gastroenterology;  Laterality: N/A;   ENDOSCOPIC MUCOSAL RESECTION  08/04/2020   Procedure: ENDOSCOPIC MUCOSAL RESECTION;  Surgeon: Wilhelmenia Aloha Raddle., MD;  Location: [name] ENDOSCOPY;  Service: Gastroenterology;;   ENID SIGMOIDOSCOPY N/A 11/24/2019   Procedure: ENID MORIN;  Surgeon: Wilhelmenia Aloha Raddle., MD;  Location: [facility] ENDOSCOPY;  Service: Gastroenterology;  Laterality: N/A;   FLEXIBLE SIGMOIDOSCOPY N/A 08/04/2020   Procedure: FLEXIBLE SIGMOIDOSCOPY;  Surgeon: Wilhelmenia Aloha Raddle., MD;  Location: [name] ENDOSCOPY;  Service: Gastroenterology;  Laterality: N/A;   HEMOSTASIS CLIP PLACEMENT  11/24/2019   Procedure: HEMOSTASIS CLIP PLACEMENT;  Surgeon: Wilhelmenia Aloha Raddle., MD;  Location: [facility] ENDOSCOPY;  Service: Gastroenterology;;   HEMOSTASIS CLIP PLACEMENT  08/04/2020   Procedure: HEMOSTASIS CLIP PLACEMENT;  Surgeon: Wilhelmenia Aloha Raddle., MD;  Location: [name] ENDOSCOPY;  Service: Gastroenterology;;   LASIK Bilateral    POLYPECTOMY     SUBMUCOSAL LIFTING INJECTION  11/24/2019   Procedure: SUBMUCOSAL LIFTING INJECTION;  Surgeon: Wilhelmenia Aloha Raddle., MD;  Location: [facility] ENDOSCOPY;  Service: Gastroenterology;;   SUBMUCOSAL LIFTING INJECTION  08/04/2020   Procedure: SUBMUCOSAL LIFTING INJECTION;  Surgeon: Wilhelmenia Aloha Raddle., MD;  Location: [name] ENDOSCOPY;  Service: Gastroenterology;;   TONSILLECTOMY  MEDICATIONS:  aspirin  (ASPIRIN  81) 81 MG chewable tablet   clonazePAM  (KLONOPIN ) 0.5 MG tablet   docusate sodium (COLACE) 100 MG capsule   Evolocumab  (REPATHA  SURECLICK) 140 MG/ML SOAJ   ferrous sulfate 325 (65 FE) MG EC tablet   gabapentin  (NEURONTIN ) 300 MG capsule   hydrochlorothiazide  (HYDRODIURIL ) 25 MG tablet   levothyroxine  (SYNTHROID ) 88 MCG tablet   MAGNESIUM PO   methocarbamol  (ROBAXIN ) 500 MG tablet   Multiple Minerals-Vitamins (CALCIUM  & VIT D3 BONE HEALTH PO)   Multiple Vitamin (MULTIVITAMIN WITH MINERALS) TABS  tablet   nitroGLYCERIN  (NITROSTAT ) 0.4 MG SL tablet   ondansetron (ZOFRAN) 4 MG tablet   oxyCODONE -acetaminophen  (PERCOCET) 7.5-325 MG tablet   Polyethyl Glycol-Propyl Glycol (SYSTANE OP)   PSYLLIUM PO   rOPINIRole  (REQUIP ) 1 MG tablet   rosuvastatin  (CRESTOR ) 5 MG tablet   No current facility-administered medications for this encounter.    Isaiah Ruder, PA-C Surgical Short Stay/Anesthesiology [facility] Phone [phone] [facility] Phone [phone] 08/14/2024 3:35 PM

## 2024-08-14 NOTE — Anesthesia Preprocedure Evaluation (Addendum)
 Anesthesia Evaluation  Patient identified by MRN, date of birth, ID band Patient awake    Reviewed: Allergy & Precautions, NPO status , Patient's Chart, lab work & pertinent test results  Airway Mallampati: II  TM Distance: >3 FB Neck ROM: Full    Dental  (+) Teeth Intact, Caps   Pulmonary former smoker   breath sounds clear to auscultation       Cardiovascular hypertension, + CAD, + Past MI and + Cardiac Stents   Rhythm:Regular Rate:Normal  Echo (2022):   1. Left ventricular ejection fraction by 3D volume is 68 %. The left  ventricle has normal function. The left ventricle has no regional wall  motion abnormalities. Left ventricular diastolic parameters were normal.  The average left ventricular global  longitudinal strain is -22.1 %. The global longitudinal strain is normal.   2. Right ventricular systolic function is normal. The right ventricular  size is normal. There is normal pulmonary artery systolic pressure. The  estimated right ventricular systolic pressure is 21.7 mmHg.   3. The mitral valve is normal in structure. Trivial mitral valve  regurgitation. No evidence of mitral stenosis.   4. The aortic valve is tricuspid. There is mild calcification of the  aortic valve. Aortic valve regurgitation is not visualized. No aortic  stenosis is present.   5. The inferior vena cava is normal in size with greater than 50%  respiratory variability, suggesting right atrial pressure of 3 mmHg.     Neuro/Psych negative neurological ROS  negative psych ROS   GI/Hepatic negative GI ROS, Neg liver ROS,,,  Endo/Other  Hypothyroidism    Renal/GU negative Renal ROS     Musculoskeletal  (+) Arthritis ,    Abdominal   Peds  Hematology   Anesthesia Other Findings   Reproductive/Obstetrics                              Anesthesia Physical Anesthesia Plan  ASA: 3  Anesthesia Plan: Spinal    Post-op Pain Management: Tylenol  PO (pre-op)*   Induction: Intravenous  PONV Risk Score and Plan: 3 and Ondansetron, Propofol  infusion and Midazolam  Airway Management Planned: Natural Airway and Nasal Cannula  Additional Equipment: None  Intra-op Plan:   Post-operative Plan:   Informed Consent: I have reviewed the patients History and Physical, chart, labs and discussed the procedure including the risks, benefits and alternatives for the proposed anesthesia with the patient or authorized representative who has indicated his/her understanding and acceptance.     Dental advisory given  Plan Discussed with: CRNA  Anesthesia Plan Comments: (PAT note written 08/14/2024 by Allison Zelenak, PA-C.  Lab Results      Component                Value               Date                      WBC                      9.6                 08/13/2024                HGB                      15.3 (H)  08/13/2024                HCT                      43.9                08/13/2024                MCV                      88.3                08/13/2024                PLT                      422 (H)             08/13/2024             )         Anesthesia Quick Evaluation

## 2024-08-15 NOTE — Progress Notes (Signed)
 Left voicemail for patient with time change for surgery. New arrival time 0930, ERAS 0900.

## 2024-08-18 ENCOUNTER — Observation Stay (HOSPITAL_COMMUNITY)

## 2024-08-18 ENCOUNTER — Encounter (HOSPITAL_COMMUNITY)
Admission: RE | Disposition: A | Discharge status: Home / Self Care | Payer: Self-pay | Source: Home / Self Care | Attending: Orthopaedic Surgery

## 2024-08-18 ENCOUNTER — Ambulatory Visit (HOSPITAL_COMMUNITY)

## 2024-08-18 ENCOUNTER — Encounter: Payer: Self-pay | Admitting: Internal Medicine

## 2024-08-18 ENCOUNTER — Observation Stay (HOSPITAL_COMMUNITY)
Admission: RE | Admit: 2024-08-18 | Discharge: 2024-08-19 | Disposition: A | Discharge status: Home / Self Care | Attending: Orthopaedic Surgery | Admitting: Orthopaedic Surgery

## 2024-08-18 ENCOUNTER — Ambulatory Visit (HOSPITAL_COMMUNITY): Payer: Self-pay | Admitting: Vascular Surgery

## 2024-08-18 ENCOUNTER — Ambulatory Visit (HOSPITAL_COMMUNITY): Admitting: Anesthesiology

## 2024-08-18 ENCOUNTER — Other Ambulatory Visit: Payer: Self-pay | Admitting: Physician Assistant

## 2024-08-18 DIAGNOSIS — I10 Essential (primary) hypertension: Secondary | ICD-10-CM | POA: Diagnosis not present

## 2024-08-18 DIAGNOSIS — M1612 Unilateral primary osteoarthritis, left hip: Principal | ICD-10-CM | POA: Diagnosis present

## 2024-08-18 DIAGNOSIS — Z87891 Personal history of nicotine dependence: Secondary | ICD-10-CM | POA: Insufficient documentation

## 2024-08-18 DIAGNOSIS — I2510 Atherosclerotic heart disease of native coronary artery without angina pectoris: Secondary | ICD-10-CM | POA: Diagnosis not present

## 2024-08-18 DIAGNOSIS — Z96642 Presence of left artificial hip joint: Secondary | ICD-10-CM | POA: Diagnosis not present

## 2024-08-18 DIAGNOSIS — E039 Hypothyroidism, unspecified: Secondary | ICD-10-CM | POA: Insufficient documentation

## 2024-08-18 DIAGNOSIS — Z79899 Other long term (current) drug therapy: Secondary | ICD-10-CM | POA: Diagnosis not present

## 2024-08-18 DIAGNOSIS — Z7982 Long term (current) use of aspirin: Secondary | ICD-10-CM | POA: Diagnosis not present

## 2024-08-18 HISTORY — PX: TOTAL HIP ARTHROPLASTY: SHX124

## 2024-08-18 LAB — ABO/RH: ABO/RH(D): A POS

## 2024-08-18 SURGERY — ARTHROPLASTY, HIP, TOTAL, ANTERIOR APPROACH
Anesthesia: Spinal | Site: Hip | Laterality: Left

## 2024-08-18 MED ORDER — METOCLOPRAMIDE HCL 5 MG PO TABS: 5.0000 mg | ORAL_TABLET | Freq: Three times a day (TID) | ORAL | Status: DC | PRN

## 2024-08-18 MED ORDER — FENTANYL CITRATE (PF) 100 MCG/2ML IJ SOLN
INTRAMUSCULAR | Status: AC
  Filled 2024-08-18: qty 2

## 2024-08-18 MED ORDER — MIDAZOLAM HCL 2 MG/2ML IJ SOLN
INTRAMUSCULAR | Status: AC
  Filled 2024-08-18: qty 2

## 2024-08-18 MED ORDER — OXYCODONE HCL 5 MG/5ML PO SOLN: 5.0000 mg | Freq: Once | ORAL | Status: DC | PRN

## 2024-08-18 MED ORDER — OXYCODONE HCL 5 MG PO TABS: 5.0000 mg | ORAL_TABLET | ORAL | Status: DC | PRN

## 2024-08-18 MED ORDER — CEFAZOLIN SODIUM-DEXTROSE 2-4 GM/100ML-% IV SOLN
INTRAVENOUS | Status: AC
  Filled 2024-08-18: qty 100

## 2024-08-18 MED ORDER — BUPIVACAINE-MELOXICAM ER 400-12 MG/14ML IJ SOLN
INTRAMUSCULAR | Status: AC
  Filled 2024-08-18: qty 1

## 2024-08-18 MED ORDER — ONDANSETRON HCL 4 MG/2ML IJ SOLN
INTRAMUSCULAR | Status: DC | PRN
Start: 2024-08-18 — End: 2024-08-18
  Administered 2024-08-18: 4 mg via INTRAVENOUS

## 2024-08-18 MED ORDER — CHLORHEXIDINE GLUCONATE 0.12 % MT SOLN: 15.0000 mL | Freq: Once | OROMUCOSAL | Status: AC

## 2024-08-18 MED ORDER — PRONTOSAN WOUND IRRIGATION OPTIME
TOPICAL | Status: DC | PRN
  Administered 2024-08-18: 500 mL via TOPICAL

## 2024-08-18 MED ORDER — PROPOFOL 10 MG/ML IV BOLUS
INTRAVENOUS | Status: DC | PRN
Start: 2024-08-18 — End: 2024-08-18
  Administered 2024-08-18: 40 mg via INTRAVENOUS
  Administered 2024-08-18: 75 ug/kg/min via INTRAVENOUS

## 2024-08-18 MED ORDER — ALBUMIN HUMAN 5 % IV SOLN: INTRAVENOUS | Status: DC | PRN

## 2024-08-18 MED ORDER — LIDOCAINE 2% (20 MG/ML) 5 ML SYRINGE
INTRAMUSCULAR | Status: DC | PRN
  Administered 2024-08-18: 60 mg via INTRAVENOUS

## 2024-08-18 MED ORDER — TRANEXAMIC ACID 1000 MG/10ML IV SOLN
INTRAVENOUS | Status: DC | PRN
  Administered 2024-08-18: 2000 mg via TOPICAL

## 2024-08-18 MED ORDER — METOCLOPRAMIDE HCL 5 MG/ML IJ SOLN: 5.0000 mg | Freq: Three times a day (TID) | INTRAMUSCULAR | Status: DC | PRN

## 2024-08-18 MED ORDER — ORAL CARE MOUTH RINSE: 15.0000 mL | Freq: Once | OROMUCOSAL | Status: AC

## 2024-08-18 MED ORDER — METHOCARBAMOL 1000 MG/10ML IJ SOLN: 500.0000 mg | Freq: Four times a day (QID) | INTRAMUSCULAR | Status: DC | PRN

## 2024-08-18 MED ORDER — HYDROMORPHONE HCL 1 MG/ML IJ SOLN
0.5000 mg | INTRAMUSCULAR | Status: DC | PRN
  Administered 2024-08-18 – 2024-08-19 (×3): 1 mg via INTRAVENOUS
  Filled 2024-08-18 (×3): qty 1

## 2024-08-18 MED ORDER — PANTOPRAZOLE SODIUM 40 MG PO TBEC
40.0000 mg | DELAYED_RELEASE_TABLET | Freq: Every day | ORAL | Status: DC
  Administered 2024-08-18 – 2024-08-19 (×2): 40 mg via ORAL
  Filled 2024-08-18 (×2): qty 1

## 2024-08-18 MED ORDER — CEFAZOLIN SODIUM-DEXTROSE 2-4 GM/100ML-% IV SOLN
2.0000 g | INTRAVENOUS | Status: AC
  Administered 2024-08-18: 2 g via INTRAVENOUS

## 2024-08-18 MED ORDER — SORBITOL 70 % SOLN: 30.0000 mL | Freq: Every day | Status: DC | PRN

## 2024-08-18 MED ORDER — DEXAMETHASONE SOD PHOSPHATE PF 10 MG/ML IJ SOLN
10.0000 mg | Freq: Once | INTRAMUSCULAR | Status: AC
  Administered 2024-08-19: 10 mg via INTRAVENOUS

## 2024-08-18 MED ORDER — LIDOCAINE 2% (20 MG/ML) 5 ML SYRINGE
INTRAMUSCULAR | Status: AC
  Filled 2024-08-18: qty 5

## 2024-08-18 MED ORDER — MEPERIDINE HCL 25 MG/ML IJ SOLN: 6.2500 mg | INTRAMUSCULAR | Status: DC | PRN

## 2024-08-18 MED ORDER — ACETAMINOPHEN 500 MG PO TABS
1000.0000 mg | ORAL_TABLET | Freq: Four times a day (QID) | ORAL | Status: AC
  Administered 2024-08-18 – 2024-08-19 (×4): 1000 mg via ORAL
  Filled 2024-08-18 (×4): qty 2

## 2024-08-18 MED ORDER — CHLORHEXIDINE GLUCONATE 0.12 % MT SOLN
OROMUCOSAL | Status: AC
  Administered 2024-08-18: 15 mL via OROMUCOSAL
  Filled 2024-08-18: qty 15

## 2024-08-18 MED ORDER — MENTHOL 3 MG MT LOZG: 1.0000 | LOZENGE | OROMUCOSAL | Status: DC | PRN

## 2024-08-18 MED ORDER — HYDROMORPHONE HCL 1 MG/ML IJ SOLN
INTRAMUSCULAR | Status: AC
  Filled 2024-08-18: qty 1

## 2024-08-18 MED ORDER — DIPHENHYDRAMINE HCL 12.5 MG/5ML PO ELIX: 25.0000 mg | ORAL_SOLUTION | ORAL | Status: DC | PRN

## 2024-08-18 MED ORDER — OXYCODONE HCL 5 MG PO TABS: 5.0000 mg | ORAL_TABLET | Freq: Once | ORAL | Status: DC | PRN

## 2024-08-18 MED ORDER — PHENOL 1.4 % MT LIQD: 1.0000 | OROMUCOSAL | Status: DC | PRN

## 2024-08-18 MED ORDER — ALUM & MAG HYDROXIDE-SIMETH 200-200-20 MG/5ML PO SUSP: 30.0000 mL | ORAL | Status: DC | PRN

## 2024-08-18 MED ORDER — ACETAMINOPHEN 10 MG/ML IV SOLN: 1000.0000 mg | Freq: Once | INTRAVENOUS | Status: DC | PRN

## 2024-08-18 MED ORDER — DEXAMETHASONE SOD PHOSPHATE PF 10 MG/ML IJ SOLN
INTRAMUSCULAR | Status: DC | PRN
  Administered 2024-08-18: 10 mg via INTRAVENOUS

## 2024-08-18 MED ORDER — ONDANSETRON HCL 4 MG/2ML IJ SOLN
INTRAMUSCULAR | Status: AC
  Filled 2024-08-18: qty 2

## 2024-08-18 MED ORDER — ONDANSETRON HCL 4 MG/2ML IJ SOLN: 4.0000 mg | Freq: Four times a day (QID) | INTRAMUSCULAR | Status: DC | PRN

## 2024-08-18 MED ORDER — SODIUM CHLORIDE 0.9 % IV SOLN: INTRAVENOUS | Status: DC

## 2024-08-18 MED ORDER — TRANEXAMIC ACID 1000 MG/10ML IV SOLN
2000.0000 mg | INTRAVENOUS | Status: DC
  Filled 2024-08-18: qty 20

## 2024-08-18 MED ORDER — BUPIVACAINE IN DEXTROSE 0.75-8.25 % IT SOLN
INTRATHECAL | Status: DC | PRN
  Administered 2024-08-18: 1.7 mL via INTRATHECAL

## 2024-08-18 MED ORDER — ASPIRIN 81 MG PO CHEW
81.0000 mg | CHEWABLE_TABLET | Freq: Two times a day (BID) | ORAL | Status: DC
  Administered 2024-08-18 – 2024-08-19 (×2): 81 mg via ORAL
  Filled 2024-08-18 (×2): qty 1

## 2024-08-18 MED ORDER — FENTANYL CITRATE (PF) 250 MCG/5ML IJ SOLN
INTRAMUSCULAR | Status: DC | PRN
Start: 2024-08-18 — End: 2024-08-18
  Administered 2024-08-18: 50 ug via INTRAVENOUS

## 2024-08-18 MED ORDER — HYDROMORPHONE HCL 1 MG/ML IJ SOLN
0.2500 mg | INTRAMUSCULAR | Status: DC | PRN
  Administered 2024-08-18 (×2): 0.5 mg via INTRAVENOUS

## 2024-08-18 MED ORDER — LEVOTHYROXINE SODIUM 88 MCG PO TABS
88.0000 ug | ORAL_TABLET | Freq: Every day | ORAL | Status: DC
  Administered 2024-08-19: 88 ug via ORAL
  Filled 2024-08-18: qty 1

## 2024-08-18 MED ORDER — DOCUSATE SODIUM 100 MG PO CAPS
100.0000 mg | ORAL_CAPSULE | Freq: Two times a day (BID) | ORAL | Status: DC
  Administered 2024-08-18 – 2024-08-19 (×2): 100 mg via ORAL
  Filled 2024-08-18 (×2): qty 1

## 2024-08-18 MED ORDER — MAGNESIUM CITRATE PO SOLN: 1.0000 | Freq: Once | ORAL | Status: DC | PRN

## 2024-08-18 MED ORDER — TRANEXAMIC ACID-NACL 1000-0.7 MG/100ML-% IV SOLN
1000.0000 mg | INTRAVENOUS | Status: AC
  Administered 2024-08-18: 1000 mg via INTRAVENOUS

## 2024-08-18 MED ORDER — MIDAZOLAM HCL (PF) 2 MG/2ML IJ SOLN
INTRAMUSCULAR | Status: DC | PRN
Start: 2024-08-18 — End: 2024-08-18
  Administered 2024-08-18: 1 mg via INTRAVENOUS

## 2024-08-18 MED ORDER — BUPIVACAINE-MELOXICAM ER 400-12 MG/14ML IJ SOLN
INTRAMUSCULAR | Status: DC | PRN
  Administered 2024-08-18: 400 mg

## 2024-08-18 MED ORDER — CEFAZOLIN SODIUM-DEXTROSE 2-4 GM/100ML-% IV SOLN
2.0000 g | Freq: Four times a day (QID) | INTRAVENOUS | Status: AC
  Administered 2024-08-18 (×2): 2 g via INTRAVENOUS
  Filled 2024-08-18 (×2): qty 100

## 2024-08-18 MED ORDER — EPHEDRINE SULFATE-NACL 50-0.9 MG/10ML-% IV SOSY
PREFILLED_SYRINGE | INTRAVENOUS | Status: DC | PRN
Start: 2024-08-18 — End: 2024-08-18
  Administered 2024-08-18: 10 mg via INTRAVENOUS
  Administered 2024-08-18: 5 mg via INTRAVENOUS

## 2024-08-18 MED ORDER — PHENYLEPHRINE 80 MCG/ML (10ML) SYRINGE FOR IV PUSH (FOR BLOOD PRESSURE SUPPORT)
PREFILLED_SYRINGE | INTRAVENOUS | Status: DC | PRN
Start: 2024-08-18 — End: 2024-08-18
  Administered 2024-08-18: 40 ug via INTRAVENOUS
  Administered 2024-08-18 (×4): 80 ug via INTRAVENOUS

## 2024-08-18 MED ORDER — TRANEXAMIC ACID-NACL 1000-0.7 MG/100ML-% IV SOLN
1000.0000 mg | Freq: Once | INTRAVENOUS | Status: AC
  Administered 2024-08-18: 1000 mg via INTRAVENOUS
  Filled 2024-08-18: qty 100

## 2024-08-18 MED ORDER — ACETAMINOPHEN 325 MG PO TABS: 325.0000 mg | ORAL_TABLET | Freq: Four times a day (QID) | ORAL | Status: DC | PRN

## 2024-08-18 MED ORDER — DROPERIDOL 2.5 MG/ML IJ SOLN: 0.6250 mg | Freq: Once | INTRAMUSCULAR | Status: DC | PRN

## 2024-08-18 MED ORDER — OXYCODONE HCL 5 MG PO TABS
10.0000 mg | ORAL_TABLET | ORAL | Status: DC | PRN
  Administered 2024-08-18 – 2024-08-19 (×4): 15 mg via ORAL
  Filled 2024-08-18 (×5): qty 3

## 2024-08-18 MED ORDER — HYDROCHLOROTHIAZIDE 25 MG PO TABS
25.0000 mg | ORAL_TABLET | Freq: Every day | ORAL | Status: DC
  Administered 2024-08-18 – 2024-08-19 (×2): 25 mg via ORAL
  Filled 2024-08-18 (×2): qty 1

## 2024-08-18 MED ORDER — LACTATED RINGERS IV SOLN: INTRAVENOUS | Status: DC

## 2024-08-18 MED ORDER — VANCOMYCIN HCL 1 G IV SOLR
INTRAVENOUS | Status: DC | PRN
  Administered 2024-08-18: 1000 mg via TOPICAL

## 2024-08-18 MED ORDER — ONDANSETRON HCL 4 MG PO TABS: 4.0000 mg | ORAL_TABLET | Freq: Four times a day (QID) | ORAL | Status: DC | PRN

## 2024-08-18 MED ORDER — OXYCODONE-ACETAMINOPHEN 7.5-325 MG PO TABS: ORAL_TABLET | ORAL | 0 refills | Status: DC

## 2024-08-18 MED ORDER — POVIDONE-IODINE 10 % EX SWAB
2.0000 | Freq: Once | CUTANEOUS | Status: AC
  Administered 2024-08-18: 2 via TOPICAL

## 2024-08-18 MED ORDER — TRANEXAMIC ACID-NACL 1000-0.7 MG/100ML-% IV SOLN
INTRAVENOUS | Status: AC
  Filled 2024-08-18: qty 100

## 2024-08-18 MED ORDER — METHOCARBAMOL 500 MG PO TABS
500.0000 mg | ORAL_TABLET | Freq: Four times a day (QID) | ORAL | Status: DC | PRN
Start: 2024-08-18 — End: 2024-08-19
  Administered 2024-08-18 – 2024-08-19 (×2): 500 mg via ORAL
  Filled 2024-08-18 (×3): qty 1

## 2024-08-18 MED ORDER — ACETAMINOPHEN 10 MG/ML IV SOLN
INTRAVENOUS | Status: AC
  Filled 2024-08-18: qty 100

## 2024-08-18 MED ORDER — POLYETHYLENE GLYCOL 3350 17 G PO PACK
17.0000 g | PACK | Freq: Every day | ORAL | Status: DC
  Administered 2024-08-18: 17 g via ORAL
  Filled 2024-08-18 (×2): qty 1

## 2024-08-18 SURGICAL SUPPLY — 49 items
BAG COUNTER SPONGE SURGICOUNT (BAG) ×1 IMPLANT
BAG DECANTER FOR FLEXI CONT (MISCELLANEOUS) ×1 IMPLANT
BALL HIP CERAMIC 32MM PLUS 9 IMPLANT
BLADE SAG 18X100X1.27 (BLADE) ×1 IMPLANT
COVER PERINEAL POST (MISCELLANEOUS) ×1 IMPLANT
COVER SURGICAL LIGHT HANDLE (MISCELLANEOUS) ×1 IMPLANT
CUP ACETAB EMPH CMTLS 46 3H (Cup) IMPLANT
DERMABOND ADVANCED .7 DNX12 (GAUZE/BANDAGES/DRESSINGS) IMPLANT
DRAPE C-ARM 42X72 X-RAY (DRAPES) ×1 IMPLANT
DRAPE POUCH INSTRU U-SHP 10X18 (DRAPES) ×1 IMPLANT
DRAPE STERI IOBAN 125X83 (DRAPES) ×1 IMPLANT
DRAPE U-SHAPE 47X51 STRL (DRAPES) ×2 IMPLANT
DRESSING AQUACEL AG SP 3.5X10 (GAUZE/BANDAGES/DRESSINGS) IMPLANT
DRSG AQUACEL AG ADV 3.5X10 (GAUZE/BANDAGES/DRESSINGS) ×1 IMPLANT
DURAPREP 26ML APPLICATOR (WOUND CARE) ×2 IMPLANT
ELECTRODE BLDE 4.0 EZ CLN MEGD (MISCELLANEOUS) ×1 IMPLANT
ELECTRODE REM PT RTRN 9FT ADLT (ELECTROSURGICAL) ×1 IMPLANT
GLOVE BIOGEL PI IND STRL 7.0 (GLOVE) ×2 IMPLANT
GLOVE BIOGEL PI IND STRL 7.5 (GLOVE) ×1 IMPLANT
GLOVE ECLIPSE 7.0 STRL STRAW (GLOVE) ×5 IMPLANT
GLOVE INDICATOR 7.0 STRL GRN (GLOVE) ×1 IMPLANT
GLOVE SURG SYN 7.5 PF PI (GLOVE) ×5 IMPLANT
GOWN STRL REUS W/ TWL LRG LVL3 (GOWN DISPOSABLE) IMPLANT
GOWN STRL REUS W/ TWL XL LVL3 (GOWN DISPOSABLE) ×1 IMPLANT
GOWN STRL SURGICAL XL XLNG (GOWN DISPOSABLE) ×1 IMPLANT
GOWN TOGA ZIPPER T7+ PEEL AWAY (MISCELLANEOUS) ×1 IMPLANT
HOOD PEEL AWAY T7 (MISCELLANEOUS) ×1 IMPLANT
IV 0.9% NACL 1000 ML (IV SOLUTION) ×1 IMPLANT
KIT BASIN OR (CUSTOM PROCEDURE TRAY) ×1 IMPLANT
LINER ACET EMPH NTRL 32X46 +4 (Liner) IMPLANT
MARKER SKIN DUAL TIP RULER LAB (MISCELLANEOUS) ×1 IMPLANT
NDL SPNL 18GX3.5 QUINCKE PK (NEEDLE) ×1 IMPLANT
NEEDLE SPNL 18GX3.5 QUINCKE PK (NEEDLE) ×1 IMPLANT
PACK TOTAL JOINT (CUSTOM PROCEDURE TRAY) ×1 IMPLANT
PACK UNIVERSAL I (CUSTOM PROCEDURE TRAY) ×1 IMPLANT
SCREW 6.5MMX30MM (Screw) IMPLANT
SET HNDPC FAN SPRY TIP SCT (DISPOSABLE) ×1 IMPLANT
SOLUTION PRONTOSAN WOUND 350ML (IRRIGATION / IRRIGATOR) ×1 IMPLANT
STEM FEM SZ3 STD ACTIS (Stem) IMPLANT
SUT ETHIBOND 2 V 37 (SUTURE) ×1 IMPLANT
SUT STRATAFIX PDS+ 0 24IN (SUTURE) IMPLANT
SUT VIC AB 0 CT1 27XBRD ANBCTR (SUTURE) ×1 IMPLANT
SUT VIC AB 1 CTX36XBRD ANBCTR (SUTURE) ×1 IMPLANT
SUT VIC AB 2-0 CT1 TAPERPNT 27 (SUTURE) ×2 IMPLANT
SYR 30ML LL (SYRINGE) ×2 IMPLANT
TOWEL GREEN STERILE (TOWEL DISPOSABLE) ×1 IMPLANT
TRAY FOLEY W/BAG SLVR 16FR ST (SET/KITS/TRAYS/PACK) IMPLANT
TUBE SUCT ARGYLE STRL (TUBING) ×1 IMPLANT
YANKAUER SUCT BULB TIP NO VENT (SUCTIONS) ×1 IMPLANT

## 2024-08-18 NOTE — H&P (Signed)
 PREOPERATIVE H&P  Chief Complaint: left hip osteoarthritis  HPI: Crystal Barnes is a 73 y.o. female who presents for surgical treatment of left hip osteoarthritis.  She denies any changes in medical history.  Past Surgical History:  Procedure Laterality Date   ABDOMINAL HYSTERECTOMY  2008   CATARACT EXTRACTION Bilateral    COLONOSCOPY     CORONARY STENT PLACEMENT  2014   ENDOSCOPIC MUCOSAL RESECTION N/A 11/24/2019   Procedure: ENDOSCOPIC MUCOSAL RESECTION;  Surgeon: Wilhelmenia Aloha Raddle., MD;  Location: [facility] ENDOSCOPY;  Service: Gastroenterology;  Laterality: N/A;   ENDOSCOPIC MUCOSAL RESECTION  08/04/2020   Procedure: ENDOSCOPIC MUCOSAL RESECTION;  Surgeon: Wilhelmenia Aloha Raddle., MD;  Location: [name]OSCOPY;  Service: Gastroenterology;;   ENID SIGMOIDOSCOPY N/A 11/24/2019   Procedure: ENID MORIN;  Surgeon: Wilhelmenia Aloha Raddle., MD;  Location: [facility] ENDOSCOPY;  Service: Gastroenterology;  Laterality: N/A;   FLEXIBLE SIGMOIDOSCOPY N/A 08/04/2020   Procedure: FLEXIBLE SIGMOIDOSCOPY;  Surgeon: Wilhelmenia Aloha Raddle., MD;  Location: [name] ENDOSCOPY;  Service: Gastroenterology;  Laterality: N/A;   HEMOSTASIS CLIP PLACEMENT  11/24/2019   Procedure: HEMOSTASIS CLIP PLACEMENT;  Surgeon: Wilhelmenia Aloha Raddle., MD;  Location: [facility] ENDOSCOPY;  Service: Gastroenterology;;   HEMOSTASIS CLIP PLACEMENT  08/04/2020   Procedure: HEMOSTASIS CLIP PLACEMENT;  Surgeon: Wilhelmenia Aloha Raddle., MD;  Location: [name];  Service: Gastroenterology;;   LASIK Bilateral    POLYPECTOMY     SUBMUCOSAL LIFTING INJECTION  11/24/2019   Procedure: SUBMUCOSAL LIFTING INJECTION;  Surgeon: Wilhelmenia Aloha Raddle., MD;  Location: [facility] ENDOSCOPY;  Service: Gastroenterology;;   SUBMUCOSAL LIFTING INJECTION  08/04/2020   Procedure: SUBMUCOSAL LIFTING INJECTION;  Surgeon: Wilhelmenia Aloha Raddle., MD;  Location: [name]OS[facility];  Service: Gastroenterology;;   TONSILLECTOMY     Social History    Socioeconomic History   Marital status: Single    Spouse name: Not on file   Number of children: Not on file   Years of education: Not on file   Highest education level: Doctorate  Occupational History   Occupation: Retired     Comment: about a year and half ago  Tobacco Use   Smoking status: Former    Types: Cigarettes   Smokeless tobacco: Never  Vaping Use   Vaping status: Never Used  Substance and Sexual Activity   Alcohol use: Not Currently   Drug use: Never   Sexual activity: Not Currently  Other Topics Concern   Not on file  Social History Narrative   Not on file   Social Drivers of Health   Financial Resource Strain: Low Risk  (11/28/2023)   Overall Financial Resource Strain (CARDIA)    Difficulty of Paying Living Expenses: Not hard at all  Food Insecurity: No Food Insecurity (11/28/2023)   Hunger Vital Sign    Worried About Running Out of Food in the Last Year: Never true    Ran Out of Food in the Last Year: Never true  Transportation Needs: No Transportation Needs (11/28/2023)   PRAPARE - Administrator, Civil Service (Medical): No    Lack of Transportation (Non-Medical): No  Physical Activity: Sufficiently Active (11/28/2023)   Exercise Vital Sign    Days of Exercise per Week: 6 days    Minutes of Exercise per Session: 90 min  Stress: No Stress Concern Present (11/28/2023)   [employer] of Occupational Health - Occupational Stress Questionnaire    Feeling of Stress : Not at all  Social Connections: Moderately Integrated (11/28/2023)   Social Connection and Isolation Panel  Frequency of Communication with Friends and Family: More than three times a week    Frequency of Social Gatherings with Friends and Family: Twice a week    Attends Religious Services: 1 to 4 times per year    Active Member of [employer] or Organizations: No    Attends Engineer, Structural: More than 4 times per year    Marital Status: Never married   Family History   Problem Relation Age of Onset   Colon polyps Mother    Esophageal cancer Father    Colon cancer Neg Hx    Rectal cancer Neg Hx    Stomach cancer Neg Hx    Inflammatory bowel disease Neg Hx    Liver disease Neg Hx    Pancreatic cancer Neg Hx    Allergies  Allergen Reactions   Benadryl [Diphenhydramine]     RLS   Cymbalta  [Duloxetine  Hcl]     Worsening of leg cramps   Prior to Admission medications   Medication Sig Start Date End Date Taking? Authorizing Provider  aspirin  (ASPIRIN  81) 81 MG chewable tablet Chew 1 tablet (81 mg total) by mouth 2 (two) times daily. To be taken after surgery to prevent blood clots 08/05/24  Yes Jule Shuck L, PA-C  clonazePAM  (KLONOPIN ) 0.5 MG tablet TAKE 1 TO 2 TABLETS BY MOUTH EVERY NIGHT AT BEDTIME Patient taking differently: Take 1 mg by mouth at bedtime. 06/30/24  Yes Burns, Glade PARAS, MD  Evolocumab  (REPATHA  SURECLICK) 140 MG/ML SOAJ Inject 140 mg into the skin every 14 (fourteen) days. 05/07/24  Yes Croitoru, Mihai, MD  ferrous sulfate 325 (65 FE) MG EC tablet Take 325 mg by mouth daily.   Yes [provider]  gabapentin  (NEURONTIN ) 300 MG capsule TAKE 1 CAPSULE BY MOUTH 3 TIMES A DAY 06/25/24  Yes Burns, Glade PARAS, MD  hydrochlorothiazide  (HYDRODIURIL ) 25 MG tablet TAKE 1 TABLET BY MOUTH DAILY 06/30/24  Yes Burns, Glade PARAS, MD  levothyroxine  (SYNTHROID ) 88 MCG tablet TAKE 1 TABLET BY MOUTH DAILY BEFORE BREAKFAST 12/12/23  Yes Burns, Glade PARAS, MD  MAGNESIUM PO Take 500 mg by mouth daily with supper.   Yes [provider]  Multiple Minerals-Vitamins (CALCIUM  & VIT D3 BONE HEALTH PO) Take 1,200 mg by mouth daily.   Yes [provider]  Multiple Vitamin (MULTIVITAMIN WITH MINERALS) TABS tablet Take 1 tablet by mouth daily.   Yes [provider]  oxyCODONE -acetaminophen  (PERCOCET) 7.5-325 MG tablet Take 1 tablet by mouth every 4 (four) hours as needed for severe pain (pain score 7-10) (chronic hip pain). 08/14/24  Yes Burns,  Glade PARAS, MD  oxyCODONE -acetaminophen  (PERCOCET) 7.5-325 MG tablet Take 1-2 tabs po every 6-8 hours prn pain. To be taken after surgery 08/18/24   Jule Shuck CROME, PA-C  Polyethyl Glycol-Propyl Glycol (SYSTANE OP) Place 1 drop into both eyes daily as needed (Dry eyes).    Yes [provider]  PSYLLIUM PO Take 15 mLs by mouth at bedtime.   Yes [provider]  rOPINIRole  (REQUIP ) 1 MG tablet TAKE 3 TO 4 TABLETS BY MOUTH AT BEDTIME 02/12/24  Yes Burns, Glade PARAS, MD  rosuvastatin  (CRESTOR ) 5 MG tablet TAKE 1 TABLET BY MOUTH DAILY 04/17/24  Yes Croitoru, Mihai, MD  docusate sodium (COLACE) 100 MG capsule Take 1 capsule (100 mg total) by mouth daily as needed. 08/05/24 08/05/25  Jule Shuck CROME, PA-C  methocarbamol  (ROBAXIN ) 500 MG tablet Take 1 tablet (500 mg total) by mouth 2 (two) times daily  as needed. To be taken after surgery 08/05/24   Jule Ronal CROME, PA-C  nitroGLYCERIN  (NITROSTAT ) 0.4 MG SL tablet Place 1 tablet (0.4 mg total) under the tongue every 5 (five) minutes as needed for chest pain. 03/25/24 03/25/25  Duke, Jon Garre, PA  ondansetron (ZOFRAN) 4 MG tablet Take 1 tablet (4 mg total) by mouth every 8 (eight) hours as needed for nausea or vomiting. 08/05/24   Jule Ronal CROME, PA-C     Positive ROS: All other systems have been reviewed and were otherwise negative with the exception of those mentioned in the HPI and as above.  Physical Exam: General: Alert, no acute distress Cardiovascular: No pedal edema Respiratory: No cyanosis, no use of accessory musculature GI: abdomen soft Skin: No lesions in the area of chief complaint Neurologic: Sensation intact distally Psychiatric: Patient is competent for consent with normal mood and affect Lymphatic: no lymphedema  MUSCULOSKELETAL: exam stable  Assessment: left hip osteoarthritis  Plan: Plan for Procedure(s): ARTHROPLASTY, HIP, TOTAL, ANTERIOR APPROACH  The risks benefits and alternatives were discussed with  the patient including but not limited to the risks of nonoperative treatment, versus surgical intervention including infection, bleeding, nerve injury,  blood clots, cardiopulmonary complications, morbidity, mortality, among others, and they were willing to proceed.   Ozell Cummins, MD 08/18/2024 11:02 AM

## 2024-08-18 NOTE — Anesthesia Procedure Notes (Signed)
 Spinal  Start time: 08/18/2024 12:13 PM End time: 08/18/2024 12:15 PM Reason for block: surgical anesthesia Staffing Performed: anesthesiologist  Anesthesiologist: Tilford Franky BIRCH, MD Performed by: Tilford Franky BIRCH, MD Authorized by: Tilford Franky BIRCH, MD   Preanesthetic Checklist Completed: patient identified, IV checked, site marked, risks and benefits discussed, surgical consent, monitors and equipment checked, pre-op evaluation and timeout performed Spinal Block Patient position: sitting Prep: DuraPrep and site prepped and draped Location: L3-4 Injection technique: single-shot Needle Needle type: Pencan  Needle gauge: 24 G Needle length: 10 cm Needle insertion depth: 10 cm Assessment Events: CSF return Additional Notes Patient tolerated well. No immediate complications.  Functioning IV was confirmed and monitors were applied. Sterile prep and drape, including hand hygiene and sterile gloves were used. The patient was positioned and the back was prepped. The skin was anesthetized with lidocaine . Free flow of clear CSF was obtained prior to injecting local anesthetic into the CSF. The spinal needle aspirated freely following injection. The needle was carefully withdrawn. The patient tolerated the procedure well.

## 2024-08-18 NOTE — Progress Notes (Signed)
 PT Cancellation Note  Patient Details Name: Crystal Barnes MRN: [id number] DOB: 1950/11/21   Cancelled Treatment:    Reason Eval/Treat Not Completed: Pain limiting ability to participate. Per nursing pt with significant post op pain. Will see in AM.   Rodgers ORN [facility] 08/18/2024, 6:12 PM Rodgers Opal PT Acute Rehabilitation Services Office [phone]

## 2024-08-18 NOTE — Op Note (Signed)
 ARTHROPLASTY, HIP, TOTAL, ANTERIOR APPROACH  Procedure Note Crystal Barnes   [id number]  Pre-op Diagnosis: left hip osteoarthritis     Post-op Diagnosis: chronic left femoral neck fracture, severe osteoarthritis  Operative Findings Chronic subcapital femoral neck fracture   Operative Procedures  1. Total hip replacement; Left hip; uncemented [id number]   Surgeon: Kay Cummins, M.D.  Assist: Ronal Morna Grave, PA-C   Anesthesia: spinal  Prosthesis: Depuy Acetabulum: Emphasys 46 mm Femur: Actis 3 std Head: 32 mm size: +9 Liner: +4 neutral Bearing Type: ceramic/poly  Total Hip Arthroplasty (Anterior Approach) Op Note:  After informed consent was obtained and the operative extremity marked in the holding area, the patient was brought back to the operating room and placed supine on the HANA table. Next, the operative extremity was prepped and draped in normal sterile fashion. Surgical timeout occurred verifying patient identification, surgical site, surgical procedure and administration of antibiotics.  Preoperative fluoroscopy showed a chronic subcapital femoral neck fracture A 8 cm longitudinal incision was made starting from 2 fingerbreadths lateral and inferior to the ASIS towards the lateral aspect of the patella.  A Hueter approach to the hip was performed, using the interval between tensor fascia lata and sartorius.  Dissection was carried bluntly down onto the anterior hip capsule. The lateral femoral circumflex vessels were identified and coagulated. A capsulotomy was performed and the capsular flaps tagged for later repair.  The neck osteotomy was performed below the level of the fracture approximately 1 fingerbreadth above the lesser trochanter.  A napkin ring of femoral neck was removed.  Then the femoral head was removed which showed severe degenerative changes, the acetabular rim was cleared of soft tissue and osteophytes and attention was turned to reaming the acetabulum.   Sequential reaming was performed under fluoroscopic guidance down to the floor of the cotyloid fossa. We reamed to a size 45 mm, and then impacted the acetabular shell. A 30 mm cancellous screw was placed to secure the shell.  A +4 neutral liner was then placed after irrigation and attention turned to the femur.  After placing the femoral hook, the leg was taken to externally rotated, extended and adducted position taking care to perform soft tissue releases to allow for adequate mobilization of the femur. Soft tissue was cleared from the shoulder of the greater trochanter and the hook elevator used to improve exposure of the proximal femur.  Lateral bone from the shoulder was rasped away for relief.  Sequential broaching performed up to a size 3.  Standard trial neck and +1 head were placed. The leg was brought back up to neutral and the construct reduced.  The position and sizing of components, offset and leg lengths were checked using fluoroscopy.  Based on fluoroscopic findings, we chose to retrial with standard neck and +9 head ball.  Stability of the construct was checked in 45 degrees of hip extension and 90 degrees of external rotation without any subluxation, shuck or impingement of prosthesis. We dislocated the prosthesis, dropped the leg back into position, removed trial components, and irrigated copiously. The final stem and head were chosen then placed, the leg brought back up, the system reduced and fluoroscopy used to verify positioning.  Antibiotic irrigation was placed in the surgical wound.   We irrigated, obtained hemostasis and closed the capsule using #2 ethibond suture.  A topical mixture of 0.25% bupivacaine and meloxicam  was placed deep to the fascia.  One gram of vancomycin powder was placed in the surgical bed.  One gram of topical tranexamic acid was injected into the joint.  The fascia was closed with #1 stratafix, the deep fat layer was closed with 0 vicryl, the subcutaneous layers  closed with 2.0 Vicryl Plus and the skin closed with 2.0 nylon and dermabond. A sterile dressing was applied. The patient was awakened in the operating room and taken to recovery in stable condition.  All sponge, needle, and instrument counts were correct at the end of the case.   Morna Grave, my PA, was a medical necessity for opening, closing, limb positioning, retracting, exposing, and overall facilitation and timely completion of the surgery.  Position: supine  Complications: see description of procedure.  Time Out: performed   Drains/Packing: none  Estimated blood loss: see anesthesia record  Returned to Recovery Room: in good condition.   Antibiotics: yes   Mechanical VTE (DVT) Prophylaxis: sequential compression devices, TED thigh-high  Chemical VTE (DVT) Prophylaxis: aspirin    Fluid Replacement: see anesthesia record  Specimens Removed: 1 to pathology   Sponge and Instrument Count Correct? yes   PACU: portable radiograph - low AP   Plan/RTC: Return in 2 weeks for suture removal. Weight Bearing/Load Lower Extremity: full  Hip precautions: none Suture Removal: 2 weeks   N. Ozell Cummins, MD Crystal Barnes 1:29 PM   Implant Name Type Inv. Item Serial No. Manufacturer Lot No. LRB No. Used Action  CUP ACETAB EMPH CMTLS 46 3H - [id number] Cup CUP ACETAB EMPH CMTLS 46 3H  [employer] [id number] Left 1 Implanted  SCREW 6.5MMX30MM - [id number] Screw SCREW 6.5MMX30MM  [employer] [id number] Left 1 Implanted  LINER ACET EMPH NTRL 32X46 +4 - [id number] Liner LINER ACET EMPH NTRL 32X46 +4  [employer] [id number] Left 1 Implanted  BALL HIP CERAMIC PLUS 9 - [id number]  BALL HIP CERAMIC PLUS 9  [employer] [id number] Left 1 Implanted  STEM FEM SZ3 STD ACTIS - [id number] Stem STEM FEM SZ3 STD ACTIS  [employer] [id number] Left 1 Implanted

## 2024-08-18 NOTE — Discharge Instructions (Signed)

## 2024-08-18 NOTE — Transfer of Care (Signed)
 Immediate Anesthesia Transfer of Care Note  Patient: Crystal Barnes  Procedure(s) Performed: ARTHROPLASTY, HIP, TOTAL, ANTERIOR APPROACH (Left: Hip)  Patient Location: PACU  Anesthesia Type:MAC and Spinal  Level of Consciousness: awake, alert , and oriented  Airway & Oxygen Therapy: Patient Spontanous Breathing and Patient connected to face mask oxygen  Post-op Assessment: Report given to RN and Post -op Vital signs reviewed and stable  Post vital signs: Reviewed and stable  Last Vitals:  Vitals Value Taken Time  BP 107/52 08/18/24 13:53  Temp 36.4 C 08/18/24 13:54  Pulse 62 08/18/24 13:56  Resp 14 08/18/24 13:56  SpO2 98 % 08/18/24 13:56  Vitals shown include unfiled device data.  Last Pain:  Vitals:   08/18/24 1023  TempSrc:   PainSc: 7       Patients Stated Pain Goal: 2 (08/18/24 1023)  Complications: No notable events documented.

## 2024-08-18 NOTE — Plan of Care (Signed)

## 2024-08-19 ENCOUNTER — Other Ambulatory Visit: Payer: Self-pay | Admitting: Physician Assistant

## 2024-08-19 ENCOUNTER — Encounter (HOSPITAL_COMMUNITY): Payer: Self-pay | Admitting: Orthopaedic Surgery

## 2024-08-19 DIAGNOSIS — M1612 Unilateral primary osteoarthritis, left hip: Secondary | ICD-10-CM | POA: Diagnosis not present

## 2024-08-19 MED ORDER — DOXYCYCLINE HYCLATE 100 MG PO TABS: 100.0000 mg | ORAL_TABLET | Freq: Two times a day (BID) | ORAL | 0 refills | Status: DC

## 2024-08-19 MED ORDER — ROPINIROLE HCL 0.5 MG PO TABS: 1.0000 mg | ORAL_TABLET | Freq: Every day | ORAL | Status: DC

## 2024-08-19 NOTE — Evaluation (Signed)
 Physical Therapy Evaluation Patient Details Name: Crystal Barnes MRN: [id number] DOB: Nov 25, 1950 Today's Date: 08/19/2024  History of Present Illness  Pt is 73 year old presented to [facility] on  08/18/24 for lt THR. Preoperative fluoroscopy showed a chronic subcapital femoral neck fracture. PMH - HTN, MI, CAD, stents, arthritis.  Clinical Impression  Pt is presenting at Mod I to supervision for all functional mobility activities including bed mobility, sit to stand, gait and stairs with RW and occasional verbal cues for sequencing to help with pain modulation/safety. Pt has 24/7 assist from family on discharge. Recommending to follow physician recommendation for physical therapy once discharged from acute care hospital setting. Pt is cleared from a physical therapy stand point to return home with family once medially cleared for discharge.         If plan is discharge home, recommend the following: Help with stairs or ramp for entrance;Assistance with cooking/housework     Equipment Recommendations BSC/3in1     Functional Status Assessment Patient has had a recent decline in their functional status and demonstrates the ability to make significant improvements in function in a reasonable and predictable amount of time.     Precautions / Restrictions Precautions Precautions: None Recall of Precautions/Restrictions: Intact Restrictions Weight Bearing Restrictions Per Provider Order: Yes LLE Weight Bearing Per Provider Order: Weight bearing as tolerated      Mobility  Bed Mobility Overal bed mobility: Modified Independent             General bed mobility comments: uses UE to assist with LLE to EOB, no use of rails    Transfers Overall transfer level: Needs assistance Equipment used: Rolling walker (2 wheels) Transfers: Sit to/from Stand Sit to Stand: Supervision, Modified independent (Device/Increase time)           General transfer comment: Mod I to supervision. Good  hand placement on standing; requires verbal cues to reach back prior to sitting. Reports mild dizziness on standing that improves in short period of time.    Ambulation/Gait Ambulation/Gait assistance: Modified independent (Device/Increase time) Gait Distance (Feet): 500 Feet Assistive device: Rolling walker (2 wheels) Gait Pattern/deviations: Step-through pattern, Antalgic, Decreased stance time - left, Decreased step length - left, Decreased stride length Gait velocity: decreased Gait velocity interpretation: 1.31 - 2.62 ft/sec, indicative of limited community ambulator   General Gait Details: pt initially stepping on L forefoot; improved significantly with cues for flat foot then heel toe gait pattern with decreased pain in WB with improved gait mechanics. Pt encouraged to increased hip flexion/knee flexion with gait. Very mild antalgic gait at end of session  Stairs Stairs: Yes Stairs assistance: Supervision Stair Management: One rail Right, Step to pattern, Forwards Number of Stairs: 3 General stair comments: verbal cues for sequencing, supervision for safety    Balance Overall balance assessment: Mild deficits observed, not formally tested       Pertinent Vitals/Pain Pain Assessment Pain Assessment: Faces Faces Pain Scale: Hurts even more Pain Location: L hip Pain Descriptors / Indicators: Aching, Constant Pain Intervention(s): Premedicated before session, Monitored during session    Home Living Family/patient expects to be discharged to:: Private residence Living Arrangements: Alone Available Help at Discharge: Family;Available 24 hours/day Type of Home: House Home Access: Stairs to enter Entrance Stairs-Rails: Doctor, General Practice of Steps: 3   Home Layout: One level Home Equipment: Agricultural Consultant (2 wheels);Crutches;Toilet riser;Shower seat      Prior Function Prior Level of Function : Independent/Modified Independent;Driving  Mobility Comments: Prior to hip fracture pt was ind. After hip fracture for past 3 months pt has been utilizing RW and crutches for mobility. ADLs Comments: Ind to Mod I with all ADLs and IADLs.     Extremity/Trunk Assessment   Upper Extremity Assessment Upper Extremity Assessment: Overall WFL for tasks assessed    Lower Extremity Assessment Lower Extremity Assessment: LLE deficits/detail;Overall WFL for tasks assessed LLE Deficits / Details: recent THA    Cervical / Trunk Assessment Cervical / Trunk Assessment: Normal  Communication   Communication Communication: No apparent difficulties    Cognition Arousal: Alert Behavior During Therapy: WFL for tasks assessed/performed   PT - Cognitive impairments: No apparent impairments     Following commands: Intact       Cueing Cueing Techniques: Verbal cues     General Comments General comments (skin integrity, edema, etc.): Incision covered, no excessive drainage noted. No signs/symptoms of cardiac/repsiratory distress.        Assessment/Plan    PT Assessment Patient needs continued PT services  PT Problem List Decreased strength;Decreased range of motion;Decreased balance;Decreased mobility;Pain       PT Treatment Interventions DME instruction;Balance training;Gait training;Stair training;Functional mobility training;Therapeutic activities;Therapeutic exercise;Patient/family education;Neuromuscular re-education    PT Goals (Current goals can be found in the Care Plan section)  Acute Rehab PT Goals Patient Stated Goal: Get back to independence. PT Goal Formulation: With patient Time For Goal Achievement: 09/02/24 Potential to Achieve Goals: Good    Frequency 7X/week        AM-PAC PT 6 Clicks Mobility  Outcome Measure Help needed turning from your back to your side while in a flat bed without using bedrails?: None Help needed moving from lying on your back to sitting on the side of a flat bed without  using bedrails?: None Help needed moving to and from a bed to a chair (including a wheelchair)?: A Little Help needed standing up from a chair using your arms (e.g., wheelchair or bedside chair)?: A Little Help needed to walk in hospital room?: A Little Help needed climbing 3-5 steps with a railing? : A Little 6 Click Score: 20    End of Session Equipment Utilized During Treatment: Gait belt Activity Tolerance: Patient tolerated treatment well Patient left: in bed;with call bell/phone within reach;with bed alarm set Nurse Communication: Mobility status PT Visit Diagnosis: Other abnormalities of gait and mobility (R26.89);Pain Pain - Right/Left: Left Pain - part of body: Hip    Time: 9055-8977 PT Time Calculation (min) (ACUTE ONLY): 38 min   Charges:   PT Evaluation $PT Eval Low Complexity: 1 Low PT Treatments $Gait Training: 8-22 mins $Therapeutic Activity: 8-22 mins PT General Charges $$ ACUTE PT VISIT: 1 Visit        Dorothyann Maier, DPT, CLT  Acute Rehabilitation Services Office: [phone] (Secure chat preferred)   Dorothyann VEAR Maier 08/19/2024, 10:28 AM

## 2024-08-19 NOTE — Progress Notes (Signed)
 Pt discharged home with sister in stable condition. DC instructions given. Scripts sent to pharmacy of choice. DME delivered to bedside. No immediate questions or concerns at this time. Will DC from unit via wheelchair

## 2024-08-19 NOTE — Discharge Summary (Signed)
 Patient ID: Crystal Barnes MRN: [id number] DOB/AGE: 04-30-1951 73 y.o.  Admit date: 08/18/2024 Discharge date: 08/19/2024  Admission Diagnoses:  Principal Problem:   Primary osteoarthritis of left hip Active Problems:   Status post total replacement of left hip   Discharge Diagnoses:  Same  Past Medical History:  Diagnosis Date   Arthritis    CAD (coronary artery disease)    Cataract    bilateral-removed   Hyperlipidemia    Hypertension    Hypothyroidism    Myocardial infarction (HCC) 06/01/2013   NSTEMI, s/p DES mRCA, 60% mLAD treated medically   Restless leg syndrome    Thyroid  disease     Surgeries: Procedure(s): ARTHROPLASTY, HIP, TOTAL, ANTERIOR APPROACH on 08/18/2024   Consultants:   Discharged Condition: Improved  Hospital Course: Crystal Barnes is an 73 y.o. female who was admitted 08/18/2024 for operative treatment ofPrimary osteoarthritis of left hip. Patient has severe unremitting pain that affects sleep, daily activities, and work/hobbies. After pre-op clearance the patient was taken to the operating room on 08/18/2024 and underwent  Procedure(s): ARTHROPLASTY, HIP, TOTAL, ANTERIOR APPROACH.    Patient was given perioperative antibiotics:  Anti-infectives (From admission, onward)    Start     Dose/Rate Route Frequency Ordered Stop   08/18/24 1830  ceFAZolin (ANCEF) IVPB 2g/100 mL premix        2 g 200 mL/hr over 30 Minutes Intravenous Every 6 hours 08/18/24 1741 08/19/24 0029   08/18/24 1253  vancomycin (VANCOCIN) powder  Status:  Discontinued          As needed 08/18/24 1253 08/18/24 1350   08/18/24 1045  ceFAZolin (ANCEF) IVPB 2g/100 mL premix        2 g 200 mL/hr over 30 Minutes Intravenous On call to O.R. 08/18/24 0936 08/18/24 1250   08/18/24 0945  ceFAZolin (ANCEF) 2-4 GM/100ML-% IVPB       Note to Pharmacy: Gleason, Ginger E: cabinet override      08/18/24 0945 08/18/24 1236        Patient was given sequential compression devices,  early ambulation, and chemoprophylaxis to prevent DVT.  Inpatient Morphine Milligram Equivalents Per Day 11/10 - 11/11   Values displayed are in units of MME/Day    Order Start / End Date Yesterday Today    oxyCODONE  (Oxy IR/ROXICODONE ) immediate release tablet 5 mg 11/10 - 11/10 0 of Unknown --    oxyCODONE  (ROXICODONE ) 5 MG/5ML solution 5 mg 11/10 - 11/10 0 of Unknown --      Group total: 0 of Unknown     HYDROmorphone (DILAUDID) injection 0.25-0.5 mg 11/10 - 11/10 20 of 40-80 --    meperidine (DEMEROL) injection 6.25-12.5 mg 11/10 - 11/10 0 of 7.5-15 --    oxyCODONE  (Oxy IR/ROXICODONE ) immediate release tablet 5-10 mg 11/10 - No end date 0 of 15-30 0 of 45-90    oxyCODONE  (Oxy IR/ROXICODONE ) immediate release tablet 10-15 mg 11/10 - No end date 45 of 30-45 22.5 of 90-135    HYDROmorphone (DILAUDID) injection 0.5-1 mg 11/10 - No end date 20 of 20-40 20 of 60-120    fentaNYL citrate (PF) (SUBLIMAZE) injection 11/10 - 11/10 *15 of 15 --    Daily Totals  * 100 of Unknown (at least 127.5-225) 42.5 of 195-345  *One-Step medication  Calculation Errors     Order Type Date Details   oxyCODONE  (Oxy IR/ROXICODONE ) immediate release tablet 5 mg Ordered Dose -- Insufficient frequency information   oxyCODONE  (ROXICODONE ) 5 MG/5ML solution 5  mg Ordered Dose -- Insufficient frequency information            Patient benefited maximally from hospital stay and there were no complications.    Recent vital signs: Patient Vitals for the past 24 hrs:  BP Temp Temp src Pulse Resp SpO2 Height Weight  08/19/24 0555 (!) 157/86 -- -- -- -- -- -- --  08/19/24 0531 (!) 168/88 98.5 F (36.9 C) Oral 87 18 100 % -- --  08/18/24 2147 113/63 98.4 F (36.9 C) Oral 78 18 97 % -- --  08/18/24 1731 (!) 158/73 -- -- 83 17 97 % -- --  08/18/24 1700 129/84 98.5 F (36.9 C) -- 78 13 95 % -- --  08/18/24 1645 132/73 -- -- 74 11 95 % -- --  08/18/24 1630 126/66 -- -- 73 14 94 % -- --  08/18/24 1615 126/71 -- -- 71 13  94 % -- --  08/18/24 1600 122/72 -- -- 69 16 97 % -- --  08/18/24 1545 98/69 -- -- 74 18 99 % -- --  08/18/24 1530 (!) 153/71 -- -- 71 15 99 % -- --  08/18/24 1515 (!) 142/68 -- -- 70 17 96 % -- --  08/18/24 1500 (!) 143/72 -- -- 63 14 100 % -- --  08/18/24 1445 130/67 -- -- (!) 58 13 100 % -- --  08/18/24 1430 (!) 117/49 -- -- (!) 56 12 100 % -- --  08/18/24 1415 134/65 -- -- (!) 57 (!) 8 100 % -- --  08/18/24 1400 (!) 101/54 -- -- 60 17 98 % -- --  08/18/24 1354 (!) 107/52 (!) 97.5 F (36.4 C) -- 68 13 95 % -- --  08/18/24 0942 139/65 98.2 F (36.8 C) Oral 83 20 99 % 5' 2 (1.575 m) 47.6 kg     Recent laboratory studies: No results for input(s): WBC, HGB, HCT, PLT, NA, K, CL, CO2, BUN, CREATININE, GLUCOSE, INR, CALCIUM  in the last 72 hours.  Invalid input(s): PT, 2   Discharge Medications:   Allergies as of 08/19/2024       Reactions   Benadryl [diphenhydramine]    RLS   Cymbalta  [duloxetine  Hcl]    Worsening of leg cramps        Medication List     TAKE these medications    aspirin  81 MG chewable tablet Commonly known as: Aspirin  81 Chew 1 tablet (81 mg total) by mouth 2 (two) times daily. To be taken after surgery to prevent blood clots   CALCIUM  & VIT D3 BONE HEALTH PO Take 1,200 mg by mouth daily.   clonazePAM  0.5 MG tablet Commonly known as: KLONOPIN  TAKE 1 TO 2 TABLETS BY MOUTH EVERY NIGHT AT BEDTIME What changed: how much to take   docusate sodium 100 MG capsule Commonly known as: Colace Take 1 capsule (100 mg total) by mouth daily as needed.   doxycycline 100 MG tablet Commonly known as: VIBRA-TABS Take 1 tablet (100 mg total) by mouth 2 (two) times daily.   ferrous sulfate 325 (65 FE) MG EC tablet Take 325 mg by mouth daily.   gabapentin  300 MG capsule Commonly known as: NEURONTIN  TAKE 1 CAPSULE BY MOUTH 3 TIMES A DAY   hydrochlorothiazide  25 MG tablet Commonly known as: HYDRODIURIL  TAKE 1 TABLET BY MOUTH  DAILY   levothyroxine  88 MCG tablet Commonly known as: SYNTHROID  TAKE 1 TABLET BY MOUTH DAILY BEFORE BREAKFAST   MAGNESIUM PO Take 500 mg by mouth daily  with supper.   methocarbamol  500 MG tablet Commonly known as: ROBAXIN  Take 1 tablet (500 mg total) by mouth 2 (two) times daily as needed. To be taken after surgery   multivitamin with minerals Tabs tablet Take 1 tablet by mouth daily.   nitroGLYCERIN  0.4 MG SL tablet Commonly known as: Nitrostat  Place 1 tablet (0.4 mg total) under the tongue every 5 (five) minutes as needed for chest pain.   ondansetron 4 MG tablet Commonly known as: Zofran Take 1 tablet (4 mg total) by mouth every 8 (eight) hours as needed for nausea or vomiting.   oxyCODONE -acetaminophen  7.5-325 MG tablet Commonly known as: Percocet Take 1-2 tabs po every 6-8 hours prn pain. To be taken after surgery What changed: Another medication with the same name was removed. Continue taking this medication, and follow the directions you see here.   PSYLLIUM PO Take 15 mLs by mouth at bedtime.   Repatha  SureClick 140 MG/ML Soaj Generic drug: Evolocumab  Inject 140 mg into the skin every 14 (fourteen) days.   rOPINIRole  1 MG tablet Commonly known as: REQUIP  TAKE 3 TO 4 TABLETS BY MOUTH AT BEDTIME   rosuvastatin  5 MG tablet Commonly known as: CRESTOR  TAKE 1 TABLET BY MOUTH DAILY   SYSTANE OP Place 1 drop into both eyes daily as needed (Dry eyes).               Durable Medical Equipment  (From admission, onward)           Start     Ordered   08/18/24 1742  DME Walker rolling  Once       Question:  Patient needs a walker to treat with the following condition  Answer:  History of hip replacement   08/18/24 1741   08/18/24 1742  DME 3 n 1  Once        08/18/24 1741   08/18/24 1742  DME Bedside commode  Once       Question:  Patient needs a bedside commode to treat with the following condition  Answer:  History of hip replacement   08/18/24 1741             Diagnostic Studies: DG Pelvis Portable Result Date: 08/18/2024 CLINICAL DATA:  Pain, postop. EXAM: PORTABLE PELVIS 1-2 VIEWS COMPARISON:  None Available. FINDINGS: Left hip arthroplasty in expected alignment. No periprosthetic lucency or fracture. Recent postsurgical change includes air and edema in the soft tissues. IMPRESSION: Left hip arthroplasty without immediate postoperative complication. Electronically Signed   By: Andrea Gasman M.D.   On: 08/18/2024 16:21   DG HIP UNILAT WITH PELVIS 1V LEFT Result Date: 08/18/2024 CLINICAL DATA:  Elective surgery EXAM: DG HIP (WITH OR WITHOUT PELVIS) 1V*L* COMPARISON:  None Available. FINDINGS: Five fluoroscopic spot views of the pelvis and left hip obtained in the operating room. Sequential images during hip arthroplasty. Fluoroscopy time 28 seconds. Dose 1.84 mGy. IMPRESSION: Intraoperative fluoroscopy during left hip arthroplasty. Electronically Signed   By: Andrea Gasman M.D.   On: 08/18/2024 16:21   DG C-Arm 1-60 Min-No Report Result Date: 08/18/2024 Fluoroscopy was utilized by the requesting physician.  No radiographic interpretation.   DG C-Arm 1-60 Min-No Report Result Date: 08/18/2024 Fluoroscopy was utilized by the requesting physician.  No radiographic interpretation.   DG Epidural/Nerve Root Result Date: 08/11/2024 CLINICAL DATA:  73 year old female with a left-sided L2 radiculopathy. She presents for selective nerve root block and transforaminal epidural steroid injection. EXAM: EPIDURAL/NERVE ROOT FLUOROSCOPY TIME:  Radiation exposure  index: 4.0 mGy, air kerma PROCEDURE: The procedure, risks, benefits, and alternatives were explained to the patient. Questions regarding the procedure were encouraged and answered. The patient understands and consents to the procedure. LEFT L2 NERVE ROOT BLOCK AND TRANSFORAMINAL EPIDURAL: A posterior oblique approach was taken to the intervertebral foramen on the left at L2 using a curved  22 gauge spinal needle. Injection of Omnipaque 180 outlined the transforaminal epidural space and showed good epidural spread. No vascular opacification is seen. Eighty mg of Depo-Medrol  mixed with 2 mL 1% lidocaine  were instilled. The procedure was well-tolerated, and the patient was discharged thirty minutes following the injection in good condition. COMPLICATIONS: None IMPRESSION: Technically successful injection consisting of a left L2 nerve root block and transforaminal epidural. Electronically Signed   By: Wilkie Lent M.D.   On: 08/11/2024 11:37    Disposition: Discharge disposition: 01-Home or Self Care          Follow-up Information     Jule Ronal CROME, PA-C. Schedule an appointment as soon as possible for a visit in 2 week(s).   Specialty: Orthopedic Surgery Contact information: [address] [place] [address] [phone]                  Signed: Ronal CROME Jule 08/19/2024, 8:09 AM

## 2024-08-19 NOTE — Progress Notes (Signed)
 Subjective: 1 Day Post-Op Procedure(s) (LRB): ARTHROPLASTY, HIP, TOTAL, ANTERIOR APPROACH (Left) Patient reports pain as moderate.    Objective: Vital signs in last 24 hours: Temp:  [97.5 F (36.4 C)-98.5 F (36.9 C)] 98.5 F (36.9 C) (11/11 0531) Pulse Rate:  [56-87] 87 (11/11 0531) Resp:  [8-20] 18 (11/11 0531) BP: (98-168)/(49-88) 157/86 (11/11 0555) SpO2:  [94 %-100 %] 100 % (11/11 0531) Weight:  [47.6 kg] 47.6 kg (11/10 0942)  Intake/Output from previous day: 11/10 0701 - 11/11 0700 In: 1660.3 [I.V.:1036.9; IV Piggyback:623.4] Out: 1600 [Urine:1500; Blood:100] Intake/Output this shift: No intake/output data recorded.  No results for input(s): HGB in the last 72 hours. No results for input(s): WBC, RBC, HCT, PLT in the last 72 hours. No results for input(s): NA, K, CL, CO2, BUN, CREATININE, GLUCOSE, CALCIUM  in the last 72 hours. No results for input(s): LABPT, INR in the last 72 hours.  Neurologically intact Neurovascular intact Sensation intact distally Intact pulses distally Dorsiflexion/Plantar flexion intact Incision: dressing C/D/I No cellulitis present Compartment soft   Assessment/Plan: 1 Day Post-Op Procedure(s) (LRB): ARTHROPLASTY, HIP, TOTAL, ANTERIOR APPROACH (Left) Advance diet Up with therapy Discharge home with home health once cleared by PT and able to urinate WBAT LLE Abx sent to pharmacy today.  Patient made aware      Ronal LITTIE Grave 08/19/2024, 8:08 AM

## 2024-08-19 NOTE — Plan of Care (Signed)
   Problem: Activity: Goal: Risk for activity intolerance will decrease Outcome: Progressing   Problem: Coping: Goal: Level of anxiety will decrease Outcome: Progressing   Problem: Pain Managment: Goal: General experience of comfort will improve and/or be controlled Outcome: Progressing   Problem: Skin Integrity: Goal: Risk for impaired skin integrity will decrease Outcome: Progressing

## 2024-08-19 NOTE — Care Management Obs Status (Signed)
 MEDICARE OBSERVATION STATUS NOTIFICATION   Patient Details  Name: Crystal Barnes MRN: [id number] Date of Birth: 06/02/1951   Medicare Observation Status Notification Given:  Yes    Bridget Cordella Simmonds, LCSW 08/19/2024, 10:57 AM

## 2024-08-20 ENCOUNTER — Encounter: Payer: Self-pay | Admitting: Internal Medicine

## 2024-08-20 ENCOUNTER — Telehealth: Payer: Self-pay | Admitting: Orthopaedic Surgery

## 2024-08-20 ENCOUNTER — Other Ambulatory Visit (HOSPITAL_COMMUNITY): Payer: Self-pay

## 2024-08-20 NOTE — Anesthesia Postprocedure Evaluation (Signed)
 Anesthesia Post Note  Patient: Crystal Barnes  Procedure(s) Performed: ARTHROPLASTY, HIP, TOTAL, ANTERIOR APPROACH (Left: Hip)     Patient location during evaluation: PACU Anesthesia Type: Spinal Level of consciousness: oriented and awake and alert Pain management: pain level controlled Vital Signs Assessment: post-procedure vital signs reviewed and stable Respiratory status: spontaneous breathing, respiratory function stable and patient connected to nasal cannula oxygen Cardiovascular status: blood pressure returned to baseline and stable Postop Assessment: no headache, no backache and no apparent nausea or vomiting Anesthetic complications: no   No notable events documented.              Linn Clavin D Sheehan Stacey

## 2024-08-20 NOTE — Progress Notes (Signed)
 Subjective:    Patient ID: Crystal Barnes, female    DOB: 14-Feb-1951, 73 y.o.   MRN: [id number]      HPI Crystal Barnes is here for  Chief Complaint  Patient presents with   Ingrown fingernail    Right pinky finger    S/p right replacement surgery three days ago.  It was found that she did have a fracture they were not aware of.  This did explain some of the increased pain she was experiencing.  Doing well since surgery.  Still taking her pain medication which is helping.   Ingrown infected fingernail - started 10 days ago.  She forgot to show me when she was here last.  It is swollen, red and has pus under the skin.  Her orthopedic did prescribe her an antibiotic, but she was not able to get it from the pharmacy-they did not have the medication in stock.  She did have some leftover amoxicillin and has been taking that and has seen some decreased erythema.  It is a little painful, but not that bad.  She is also on the pain medication from her recent surgery.  She has not had any fevers or chills     Medications and allergies reviewed with patient and updated if appropriate.  Current Outpatient Medications on File Prior to Visit  Medication Sig Dispense Refill   aspirin  (ASPIRIN  81) 81 MG chewable tablet Chew 1 tablet (81 mg total) by mouth 2 (two) times daily. To be taken after surgery to prevent blood clots 84 tablet 0   clonazePAM  (KLONOPIN ) 0.5 MG tablet Take 1-2 tablets (0.5-1 mg total) by mouth at bedtime. 60 tablet 3   docusate sodium (COLACE) 100 MG capsule Take 1 capsule (100 mg total) by mouth daily as needed. 30 capsule 2   doxycycline (VIBRA-TABS) 100 MG tablet Take 1 tablet (100 mg total) by mouth 2 (two) times daily. 28 tablet 0   Evolocumab  (REPATHA  SURECLICK) 140 MG/ML SOAJ Inject 140 mg into the skin every 14 (fourteen) days. 6 mL 1   ferrous sulfate 325 (65 FE) MG EC tablet Take 325 mg by mouth daily.     gabapentin  (NEURONTIN ) 300 MG capsule TAKE 1 CAPSULE BY MOUTH 3  TIMES A DAY 270 capsule 0   hydrochlorothiazide  (HYDRODIURIL ) 25 MG tablet TAKE 1 TABLET BY MOUTH DAILY 90 tablet 3   levothyroxine  (SYNTHROID ) 88 MCG tablet TAKE 1 TABLET BY MOUTH DAILY BEFORE BREAKFAST 90 tablet 3   MAGNESIUM PO Take 500 mg by mouth daily with supper.     methocarbamol  (ROBAXIN ) 500 MG tablet Take 1 tablet (500 mg total) by mouth 2 (two) times daily as needed. To be taken after surgery 20 tablet 2   Multiple Minerals-Vitamins (CALCIUM  & VIT D3 BONE HEALTH PO) Take 1,200 mg by mouth daily.     Multiple Vitamin (MULTIVITAMIN WITH MINERALS) TABS tablet Take 1 tablet by mouth daily.     nitroGLYCERIN  (NITROSTAT ) 0.4 MG SL tablet Place 1 tablet (0.4 mg total) under the tongue every 5 (five) minutes as needed for chest pain. 25 tablet 3   ondansetron (ZOFRAN) 4 MG tablet Take 1 tablet (4 mg total) by mouth every 8 (eight) hours as needed for nausea or vomiting. 40 tablet 0   oxyCODONE -acetaminophen  (PERCOCET) 7.5-325 MG tablet Take 1-2 tabs po every 6-8 hours prn pain. To be taken after surgery 40 tablet 0   Polyethyl Glycol-Propyl Glycol (SYSTANE OP) Place 1 drop into both eyes daily  as needed (Dry eyes).      PSYLLIUM PO Take 15 mLs by mouth at bedtime.     rOPINIRole  (REQUIP ) 1 MG tablet TAKE 3 TO 4 TABLETS BY MOUTH AT BEDTIME 360 tablet 2   rosuvastatin  (CRESTOR ) 5 MG tablet TAKE 1 TABLET BY MOUTH DAILY 90 tablet 3   No current facility-administered medications on file prior to visit.    Review of Systems     Objective:   Vitals:   08/21/24 0947  BP: 136/76  Pulse: 84  Temp: 98.1 F (36.7 C)  SpO2: 98%   BP Readings from Last 3 Encounters:  08/21/24 136/76  08/19/24 (!) 168/87  08/14/24 (!) 140/62   Wt Readings from Last 3 Encounters:  08/18/24 105 lb (47.6 kg)  08/14/24 102 lb (46.3 kg)  08/13/24 103 lb 8 oz (46.9 kg)   Body mass index is 19.2 kg/m.    Physical Exam Constitutional:      General: She is not in acute distress.    Appearance: Normal  appearance. She is not ill-appearing.  HENT:     Head: Normocephalic and atraumatic.  Musculoskeletal:     Right lower leg: No edema.     Left lower leg: No edema.     Comments: Left hip with bandage from recent hip replacement-no surrounding erythema outside of the bandage  Skin:    General: Skin is warm and dry.     Comments: Right pinky finger on the medial aspect of nail and around cuticle and medial aspect there is significant swelling, erythema and fluctuance.  Area tender to touch.  Neurological:     Mental Status: She is alert.       I & D  Date/Time: 08/21/2024 1:27 PM  Performed by: Geofm Glade PARAS, MD Authorized by: Geofm Glade PARAS, MD   Consent:    Consent obtained:  Verbal   Consent given by:  Patient   Risks, benefits, and alternatives were discussed: yes     Risks discussed:  Bleeding and pain   Alternatives discussed:  Alternative treatment Location:    Type:  Abscess   Location:  Upper extremity   Upper extremity location:  Finger   Finger location:  R small finger Pre-procedure details:    Skin preparation:  Alcohol Sedation:    Sedation type:  None Anesthesia:    Anesthesia method:  Topical application   Topical anesthesia: pain ease. Procedure type:    Complexity:  Simple Procedure details:    Needle aspiration: no     Incision types:  Single straight   Incision depth:  Dermal   Scalpel blade:  15   Wound management:  Probed and deloculated   Drainage:  Bloody and purulent   Drainage amount:  Moderate   Wound treatment:  Wound left open   Packing materials:  None Post-procedure details:    Procedure completion:  Tolerated well, no immediate complications       Assessment & Plan:    See Problem List for Assessment and Plan of chronic medical problems.

## 2024-08-20 NOTE — Telephone Encounter (Signed)
 Pt called saying that she is at home and doing well. She needs the physical terapist to come to her house for 2 weeks. Call back number is [phone].

## 2024-08-20 NOTE — Telephone Encounter (Signed)
 Messaged Artavia with [facility].

## 2024-08-21 ENCOUNTER — Ambulatory Visit: Admitting: Internal Medicine

## 2024-08-21 VITALS — BP 136/76 | HR 84 | Temp 98.1°F | Ht 62.0 in

## 2024-08-21 DIAGNOSIS — I10 Essential (primary) hypertension: Secondary | ICD-10-CM

## 2024-08-21 DIAGNOSIS — L03011 Cellulitis of right finger: Secondary | ICD-10-CM | POA: Insufficient documentation

## 2024-08-21 NOTE — Assessment & Plan Note (Signed)
 Acute Has had symptoms for 10 days-forgot to show me the last time she was here Has gotten worse and is very swollen and has a large pus collection Currently taking amoxicillin she had at home-was prescribed doxycycline by her orthopedist, but the pharmacy did not have the prescription Continue amoxicillin until you are able to get doxycycline and then complete doxycycline-if for some reason you not able to get doxycycline let me know I&D today Soak in warm Epsom salts Monitor closely and call with any concerns

## 2024-08-21 NOTE — Assessment & Plan Note (Signed)
 Chronic Blood pressure slightly elevated-could be related to pain, recent surgery Continue hydrochlorothiazide  25 mg daily Will reevaluate at her next appointment

## 2024-08-21 NOTE — Patient Instructions (Addendum)
      Do warm epsom salt soaks.    Medications changes include :   take the antibiotic-amoxicillin until you are able to get the doxycycline and then complete doxycycline course.  Monitor area closely-if erythema, swelling do not improve and completely resolve let me know.  Area may continue to drain which is okay and actually in good to have more pus come out.     Return if symptoms worsen or fail to improve.

## 2024-08-22 ENCOUNTER — Other Ambulatory Visit (HOSPITAL_COMMUNITY): Payer: Self-pay

## 2024-08-22 MED ORDER — DOXYCYCLINE HYCLATE 100 MG PO TABS
100.0000 mg | ORAL_TABLET | Freq: Two times a day (BID) | ORAL | 0 refills | Status: DC
  Filled 2024-08-27: qty 28, 14d supply, fill #0

## 2024-08-22 MED ORDER — DOXYCYCLINE HYCLATE 100 MG PO TABS: 100.0000 mg | ORAL_TABLET | Freq: Two times a day (BID) | ORAL | 0 refills | Status: DC

## 2024-08-26 MED ORDER — OXYCODONE-ACETAMINOPHEN 7.5-325 MG PO TABS: ORAL_TABLET | ORAL | 0 refills | Status: DC

## 2024-08-26 NOTE — Addendum Note (Signed)
 Addended by: GEOFM GLADE PARAS on: 08/26/2024 09:22 PM   Modules accepted: Orders

## 2024-08-27 ENCOUNTER — Other Ambulatory Visit: Payer: Self-pay

## 2024-08-27 ENCOUNTER — Other Ambulatory Visit (HOSPITAL_COMMUNITY): Payer: Self-pay

## 2024-08-27 MED ORDER — CELECOXIB 200 MG PO CAPS
200.0000 mg | ORAL_CAPSULE | Freq: Two times a day (BID) | ORAL | 3 refills | Status: DC
  Filled 2024-08-27: qty 60, 30d supply, fill #0

## 2024-08-27 MED FILL — Clonazepam Tab 0.5 MG: ORAL | 30 days supply | Qty: 60 | Fill #0 | Status: CN

## 2024-08-27 MED FILL — Levothyroxine Sodium Tab 88 MCG: ORAL | 90 days supply | Qty: 90 | Fill #0 | Status: AC

## 2024-08-28 ENCOUNTER — Encounter: Payer: Self-pay | Admitting: Orthopaedic Surgery

## 2024-08-28 ENCOUNTER — Ambulatory Visit: Admitting: Orthopaedic Surgery

## 2024-08-28 DIAGNOSIS — Z96642 Presence of left artificial hip joint: Secondary | ICD-10-CM

## 2024-08-28 NOTE — Progress Notes (Signed)
 Post-Op Visit Note   Patient: Crystal Barnes           Date of Birth: 1951/08/28           MRN: [id number] Visit Date: 08/28/2024 PCP: Geofm Glade PARAS, MD   Assessment & Plan:  Chief Complaint:  Chief Complaint  Patient presents with   Left Hip - Follow-up    Left THA 08/18/2024   Visit Diagnoses:  1. Status post total replacement of left hip     Plan: History of Present Illness Crystal Barnes is a 73 year old female who presents for follow-up after left hip replacement surgery.  She underwent left total hip replacement surgery 10 days ago due to a stress fracture of the femoral neck. Post-operatively, she experiences pain described as a severe muscle spasm. She manages her symptoms with ice, anti-inflammatory medications, and opiate painkillers. She inquires about resuming activities such as water walking and driving. She expresses concern about her dogs' jumping behavior during her recovery.  Procedure: Suture removal Description: Sutures were removed from the incision site. Steri-Strips were applied across the incision.  Assessment and Plan Postoperative management of left hip replacement for femoral neck fracture Day ten post-surgery. X-ray confirms successful hip replacement. - Removed sutures, applied SteriStrips. - Allow showering. - Advised six-week wait for water walking. - Discussed driving; opiate use noted, but she may drive if comfortable. - Advised six-week wait before bringing dogs home.  Postoperative pain following left hip replacement Pain described as aching, similar to severe muscle cramp. Expected to improve. - Use ice and anti-inflammatories for pain management.  Follow-Up Instructions: Return in about 4 weeks (around 09/25/2024) for with lindsey.   Orders:  No orders of the defined types were placed in this encounter.  No orders of the defined types were placed in this encounter.   Imaging: No results found.  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) 08/14/2024   Chronic hip pain, left 08/14/2024   Preop examination 06/03/2024   Primary osteoarthritis of left hip 05/13/2024   Mid back pain on right side 11/29/2023   Iron deficiency 01/07/2023   Hyperkalemia 05/15/2022   History of rectal polypectomy 06/06/2020   Serrated adenoma of colon 06/06/2020   Hx of adenomatous colonic polyps 06/06/2020   Myocardial infarction (HCC)    Arthralgia of both hands 06/26/2019   CAD (coronary artery disease) 02/10/2019   HTN (hypertension) 02/10/2019   Hypothyroidism (acquired) 02/10/2019   Hyperlipidemia, mixed 02/10/2019   Osteopenia 02/10/2019   RLS (restless legs syndrome) 02/10/2019   Chronic pain-pelvic floor dysfunction 02/10/2019   Past Medical History:  Diagnosis Date   Arthritis    CAD (coronary artery disease)    Cataract    bilateral-removed   Hyperlipidemia    Hypertension    Hypothyroidism    Myocardial infarction (HCC) 06/01/2013   NSTEMI, s/p DES mRCA, 60% mLAD treated medically   Restless leg syndrome    Thyroid  disease     Family History  Problem Relation Age of Onset   Colon polyps Mother    Esophageal cancer Father    Colon cancer Neg Hx    Rectal cancer Neg Hx    Stomach cancer Neg Hx    Inflammatory bowel disease Neg Hx    Liver disease Neg Hx  Pancreatic cancer Neg Hx     Past Surgical History:  Procedure Laterality Date   ABDOMINAL HYSTERECTOMY  2008   CATARACT EXTRACTION Bilateral    COLONOSCOPY     CORONARY STENT PLACEMENT  2014   ENDOSCOPIC MUCOSAL RESECTION N/A 11/24/2019   Procedure: ENDOSCOPIC MUCOSAL RESECTION;  Surgeon: Wilhelmenia Aloha Raddle., MD;  Location: [facility] ENDOSCOPY;  Service: Gastroenterology;  Laterality: N/A;   ENDOSCOPIC MUCOSAL RESECTION  08/04/2020   Procedure: ENDOSCOPIC MUCOSAL RESECTION;   Surgeon: Wilhelmenia Aloha Raddle., MD;  Location: [name] ENDOSCOPY;  Service: Gastroenterology;;   ENID SIGMOIDOSCOPY N/A 11/24/2019   Procedure: ENID MORIN;  Surgeon: Wilhelmenia Aloha Raddle., MD;  Location: [facility] ENDOSCOPY;  Service: Gastroenterology;  Laterality: N/A;   FLEXIBLE SIGMOIDOSCOPY N/A 08/04/2020   Procedure: FLEXIBLE SIGMOIDOSCOPY;  Surgeon: Wilhelmenia Aloha Raddle., MD;  Location: [name] ENDOSCOPY;  Service: Gastroenterology;  Laterality: N/A;   HEMOSTASIS CLIP PLACEMENT  11/24/2019   Procedure: HEMOSTASIS CLIP PLACEMENT;  Surgeon: Wilhelmenia Aloha Raddle., MD;  Location: [facility] ENDOSCOPY;  Service: Gastroenterology;;   HEMOSTASIS CLIP PLACEMENT  08/04/2020   Procedure: HEMOSTASIS CLIP PLACEMENT;  Surgeon: Wilhelmenia Aloha Raddle., MD;  Location: [name] ENDOSCOPY;  Service: Gastroenterology;;   LASIK Bilateral    POLYPECTOMY     SUBMUCOSAL LIFTING INJECTION  11/24/2019   Procedure: SUBMUCOSAL LIFTING INJECTION;  Surgeon: Wilhelmenia Aloha Raddle., MD;  Location: [facility] ENDOSCOPY;  Service: Gastroenterology;;   SUBMUCOSAL LIFTING INJECTION  08/04/2020   Procedure: SUBMUCOSAL LIFTING INJECTION;  Surgeon: Wilhelmenia Aloha Raddle., MD;  Location: [name] ENDOS[facility];  Service: Gastroenterology;;   TONSILLECTOMY     TOTAL HIP ARTHROPLASTY Left 08/18/2024   Procedure: ARTHROPLASTY, HIP, TOTAL, ANTERIOR APPROACH;  Surgeon: Jerri Kay HERO, MD;  Location: [facility] OR;  Service: Orthopedics;  Laterality: Left;  3-C   Social History   Occupational History   Occupation: Retired     Comment: about a year and half ago  Tobacco Use   Smoking status: Former    Types: Cigarettes   Smokeless tobacco: Never  Vaping Use   Vaping status: Never Used  Substance and Sexual Activity   Alcohol use: Not Currently   Drug use: Never   Sexual activity: Not Currently

## 2024-08-29 ENCOUNTER — Other Ambulatory Visit: Payer: Self-pay

## 2024-08-29 ENCOUNTER — Other Ambulatory Visit (HOSPITAL_COMMUNITY): Payer: Self-pay

## 2024-08-29 ENCOUNTER — Encounter: Admitting: Physician Assistant

## 2024-08-29 DIAGNOSIS — G8929 Other chronic pain: Secondary | ICD-10-CM

## 2024-08-29 DIAGNOSIS — Z96642 Presence of left artificial hip joint: Secondary | ICD-10-CM

## 2024-08-29 MED FILL — Clonazepam Tab 0.5 MG: ORAL | 30 days supply | Qty: 60 | Fill #0 | Status: AC

## 2024-08-29 MED FILL — Clonazepam Tab 0.5 MG: ORAL | 30 days supply | Qty: 60 | Fill #0 | Status: CN

## 2024-08-29 NOTE — Telephone Encounter (Signed)
 Please order dexa scan.  Thanks.

## 2024-08-31 ENCOUNTER — Encounter: Payer: Self-pay | Admitting: Internal Medicine

## 2024-09-02 ENCOUNTER — Telehealth: Payer: Self-pay | Admitting: Orthopaedic Surgery

## 2024-09-02 ENCOUNTER — Other Ambulatory Visit: Payer: Self-pay

## 2024-09-02 ENCOUNTER — Encounter: Payer: Self-pay | Admitting: Internal Medicine

## 2024-09-02 DIAGNOSIS — Z96642 Presence of left artificial hip joint: Secondary | ICD-10-CM

## 2024-09-02 MED ORDER — HYDROCODONE-ACETAMINOPHEN 5-325 MG PO TABS: 1.0000 | ORAL_TABLET | ORAL | 0 refills | Status: DC | PRN

## 2024-09-02 NOTE — Telephone Encounter (Signed)
 Called patient. No answer. LMOM that outpatient PT and dexa scan have been ordered.

## 2024-09-02 NOTE — Telephone Encounter (Signed)
 Pt called about are ferral for outpt physical therapy. Explained to pt that inpatient has not called yet that she has been discharged and they will call call to inform Dr Jerri she she has been discharged to outpatient. Also asking about referral for dexa scan. Please call pt about this matter at [phone].

## 2024-09-08 ENCOUNTER — Other Ambulatory Visit: Payer: Self-pay

## 2024-09-08 ENCOUNTER — Encounter: Payer: Self-pay | Admitting: Orthopaedic Surgery

## 2024-09-08 DIAGNOSIS — M79672 Pain in left foot: Secondary | ICD-10-CM

## 2024-09-08 NOTE — Telephone Encounter (Signed)
 Please send her to PT.  Thanks.

## 2024-09-09 ENCOUNTER — Ambulatory Visit: Attending: Orthopaedic Surgery | Admitting: Physical Therapy

## 2024-09-09 ENCOUNTER — Other Ambulatory Visit (HOSPITAL_COMMUNITY): Payer: Self-pay

## 2024-09-09 ENCOUNTER — Encounter: Payer: Self-pay | Admitting: Physical Therapy

## 2024-09-09 ENCOUNTER — Other Ambulatory Visit: Payer: Self-pay

## 2024-09-09 DIAGNOSIS — R252 Cramp and spasm: Secondary | ICD-10-CM | POA: Insufficient documentation

## 2024-09-09 DIAGNOSIS — Z471 Aftercare following joint replacement surgery: Secondary | ICD-10-CM | POA: Diagnosis not present

## 2024-09-09 DIAGNOSIS — Z96642 Presence of left artificial hip joint: Secondary | ICD-10-CM | POA: Diagnosis not present

## 2024-09-09 DIAGNOSIS — M79672 Pain in left foot: Secondary | ICD-10-CM | POA: Insufficient documentation

## 2024-09-09 DIAGNOSIS — R262 Difficulty in walking, not elsewhere classified: Secondary | ICD-10-CM | POA: Insufficient documentation

## 2024-09-09 DIAGNOSIS — M6281 Muscle weakness (generalized): Secondary | ICD-10-CM | POA: Insufficient documentation

## 2024-09-09 DIAGNOSIS — M25552 Pain in left hip: Secondary | ICD-10-CM | POA: Insufficient documentation

## 2024-09-09 NOTE — Therapy (Signed)
 OUTPATIENT PHYSICAL THERAPY LOWER EXTREMITY EVALUATION   Patient Name: Crystal Barnes MRN: [id number] DOB:1951/08/05, 73 y.o., female Today's Date: 09/09/2024  END OF SESSION:  PT End of Session - 09/09/24 1108     Visit Number 1    Date for Recertification  11/04/24    Authorization Type [employer] no auth needed    Progress Note Due on Visit 10    PT Start Time 1101    PT Stop Time 1144    PT Time Calculation (min) 43 min    Activity Tolerance Patient tolerated treatment well          Past Medical History:  Diagnosis Date   Arthritis    CAD (coronary artery disease)    Cataract    bilateral-removed   Hyperlipidemia    Hypertension    Hypothyroidism    Myocardial infarction (HCC) 06/01/2013   NSTEMI, s/p DES mRCA, 60% mLAD treated medically   Restless leg syndrome    Thyroid  disease    Past Surgical History:  Procedure Laterality Date   ABDOMINAL HYSTERECTOMY  2008   CATARACT EXTRACTION Bilateral    COLONOSCOPY     CORONARY STENT PLACEMENT  2014   ENDOSCOPIC MUCOSAL RESECTION N/A 11/24/2019   Procedure: ENDOSCOPIC MUCOSAL RESECTION;  Surgeon: Wilhelmenia Aloha Raddle., MD;  Location: [facility] ENDOSCOPY;  Service: Gastroenterology;  Laterality: N/A;   ENDOSCOPIC MUCOSAL RESECTION  08/04/2020   Procedure: ENDOSCOPIC MUCOSAL RESECTION;  Surgeon: Wilhelmenia Aloha Raddle., MD;  Location: [name] ENDOSCOPY;  Service: Gastroenterology;;   ENID SIGMOIDOSCOPY N/A 11/24/2019   Procedure: ENID MORIN;  Surgeon: Wilhelmenia Aloha Raddle., MD;  Location: [facility] ENDOSCOPY;  Service: Gastroenterology;  Laterality: N/A;   FLEXIBLE SIGMOIDOSCOPY N/A 08/04/2020   Procedure: FLEXIBLE SIGMOIDOSCOPY;  Surgeon: Wilhelmenia Aloha Raddle., MD;  Location: [name] ENDOSCOPY;  Service: Gastroenterology;  Laterality: N/A;   HEMOSTASIS CLIP PLACEMENT  11/24/2019   Procedure: HEMOSTASIS CLIP PLACEMENT;  Surgeon: Wilhelmenia Aloha Raddle., MD;  Location: [facility] ENDOSCOPY;  Service: Gastroenterology;;    HEMOSTASIS CLIP PLACEMENT  08/04/2020   Procedure: HEMOSTASIS CLIP PLACEMENT;  Surgeon: Wilhelmenia Aloha Raddle., MD;  Location: [name]OSCOPY;  Service: Gastroenterology;;   LASIK Bilateral    POLYPECTOMY     SUBMUCOSAL LIFTING INJECTION  11/24/2019   Procedure: SUBMUCOSAL LIFTING INJECTION;  Surgeon: Wilhelmenia Aloha Raddle., MD;  Location: [facility] ENDOSCOPY;  Service: Gastroenterology;;   SUBMUCOSAL LIFTING INJECTION  08/04/2020   Procedure: SUBMUCOSAL LIFTING INJECTION;  Surgeon: Wilhelmenia Aloha Raddle., MD;  Location: [name]OS[facility];  Service: Gastroenterology;;   TONSILLECTOMY     TOTAL HIP ARTHROPLASTY Left 08/18/2024   Procedure: ARTHROPLASTY, HIP, TOTAL, ANTERIOR APPROACH;  Surgeon: Jerri Kay HERO, MD;  Location: [facility] OR;  Service: Orthopedics;  Laterality: Left;  3-C   Patient Active Problem List   Diagnosis Date Noted   Paronychia of right little finger 08/21/2024   Status post total replacement of left hip 08/18/2024   Stage 2 skin ulcer of sacral region (HCC) 08/14/2024   Chronic hip pain, left 08/14/2024   Preop examination 06/03/2024   Primary osteoarthritis of left hip 05/13/2024   Mid back pain on right side 11/29/2023   Iron deficiency 01/07/2023   Hyperkalemia 05/15/2022   History of rectal polypectomy 06/06/2020   Serrated adenoma of colon 06/06/2020   Hx of adenomatous colonic polyps 06/06/2020   Myocardial infarction [facility])    Arthralgia of both hands 06/26/2019   CAD (coronary artery disease) 02/10/2019   HTN (hypertension) 02/10/2019   Hypothyroidism (acquired) 02/10/2019   Hyperlipidemia,  mixed 02/10/2019   Osteopenia 02/10/2019   RLS (restless legs syndrome) 02/10/2019   Chronic pain-pelvic floor dysfunction 02/10/2019    PCP: Geofm Hastings MD  REFERRING PROVIDER: Jerri Moody MD  REFERRING DIAG: [phone] s/p total replacement left hip; M79.672 pain in left foot  THERAPY DIAG:  weakness Pain in left foot  Pain in left hip  Difficulty in walking, not elsewhere  classified  Rationale for Evaluation and Treatment: Rehabilitation  ONSET DATE: 08/18/24  SUBJECTIVE:   SUBJECTIVE STATEMENT: Born pigeon-toed.  Started hip pain in recent years.  Had cortisone injection but no relief.  During [facility] on Nov 10th surgery discovered I had a stress fracture. Been doing standing at the counter ex's.  Yesterday I had a terrible time with left foot pain and a charley horse in thigh.  Woke up this morning and it's fine.   Left THR anterior approach Aug 18, 2024 No assistive device.    PERTINENT HISTORY: Restless legs left > right long duration (going to see a specialist in July) Cardiac stent 10 years ago Has seen [facility] for pelvic floor PT helped a great deal PAIN:   Are you having pain? Yes NPRS scale: 5/10 Pain location: anterior pelvis and incision Pain orientation: Left  PAIN TYPE: aching Pain description: intermittent  Aggravating factors: too much activity Relieving factors: rest   PRECAUTIONS: None, no swimming for 3 months   WEIGHT BEARING RESTRICTIONS: No  FALLS:  Has patient fallen in last 6 months? 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   PATIENT SURVEYS:  LEFS  Extreme difficulty/unable (0), Quite a bit of difficulty (1), Moderate difficulty (2), Little difficulty (3), No difficulty (4) Survey date:  12/2  Any of your usual work, housework or school activities 0  2. Usual hobbies, recreational or sporting activities 0  3. Getting into/out of the bath 0  4. Walking between rooms 4  5. Putting on  socks/shoes 3  6. Squatting  0  7. Lifting an object, like a bag of groceries from the floor 3  8. Performing light activities around your home 3  9. Performing heavy activities around your home 0  10. Getting into/out of a car 2  11. Walking 2 blocks 1  12. Walking 1 mile 0  13. Going up/down 10 stairs (1 flight) 0  14. Standing for 1 hour 0  15.  sitting for 1 hour 0  16. Running on even ground 0  17. Running on uneven ground 0  18. Making sharp turns while running fast 0  19. Hopping  0  20. Rolling over in bed 1  Score total:  17/80     COGNITION: Overall cognitive status: Within functional limits for tasks assessed      EDEMA:  Mild edema lateral to incision. Large bandaid covering middle of incision but top of bottom of incision appear to be healing well.  No redness   LOWER EXTREMITY ROM: left hip flexion 90 degrees, extension 5 degrees   LOWER EXTREMITY MMT:   MMT Right eval Left eval  Hip flexion 5 4-  Hip extension 5 4-  Hip abduction 5 4-  Hip adduction    Hip internal rotation    Hip external rotation    Knee flexion 5 4  Knee extension 5 4  Ankle dorsiflexion    Ankle plantarflexion    Ankle inversion    Ankle eversion     (Blank rows = not tested)  LOWER EXTREMITY SPECIAL TESTS:  15 sec SLS on left  FUNCTIONAL TESTS:  5x STS 9.16 sec no hands assist TUG 8.17 3 MWT 600 feet no device Up and down 4 steps reciprocally with 2 railings light support  GAIT: Comments: with fatigue after 1.5 minutes of walking decreased stance time noted on left and mild pelvic drop                                                                                                                                TREATMENT DATE: 09/09/24 Evaluation Discussion of expected course of recovery Discussed gradual increase of walking by small increments  Review of HEP given by home PT: Standing counter ex's: heel raises, seated yellow band clams, hip abduction, hip  extension, standing hip circles  PATIENT EDUCATION:  Education details: Educated patient on anatomy and physiology of current symptoms, prognosis, plan of care as well as initial self care strategies to promote recovery Person educated: Patient Education method: Explanation Education comprehension: verbalized understanding  HOME EXERCISE PROGRAM: To be progressed  ASSESSMENT:  CLINICAL IMPRESSION: Patient is a 73  y.o. female who was seen today for physical therapy evaluation and treatment for s/p left anterior approach THR on 11/10.  She also has a referral for left foot pain which was present yesterday but has resolved today.  She is ambulating without an assistive device.  With short distances she is ambulating well although she fatigues quickly and within 1.5 minutes decreased stance time on left and pelvic drop noted.  She lacks the strength in gluteals needed for standing and walking in the community and to be able to work in her yard.  She is limited in performance of household chores and she has not attempted to walk her 2 dogs.    OBJECTIVE IMPAIRMENTS: decreased activity tolerance, decreased mobility, difficulty walking, decreased ROM, decreased strength, impaired perceived functional ability, and pain.   ACTIVITY LIMITATIONS: carrying, lifting, bending, standing, squatting, bed mobility, hygiene/grooming, and locomotion level  PARTICIPATION LIMITATIONS: meal prep, cleaning, shopping, community activity, and yard work  PERSONAL FACTORS: 1 comorbidity: Restless legs, cardiac history are also affecting patient's functional outcome.   REHAB POTENTIAL: Good  CLINICAL DECISION MAKING: Stable/uncomplicated  EVALUATION COMPLEXITY: Low   GOALS: Goals reviewed with patient? Yes  SHORT TERM GOALS: Target date: 10/07/2024   The patient will demonstrate knowledge of basic self care strategies and exercises to promote healing  Baseline: Goal status: INITIAL  2.  The patient will  have improved gait stamina and speed needed to ambulate  1200 feet in 6 minutes  Baseline:  Goal status: INITIAL  3.  The patient will be able to turn/roll in bed with minimal to no difficulty Baseline:  Goal status: INITIAL  4. The patient will report a 50% improvement in pain levels with functional activities  INITIAL     LONG TERM GOALS: Target date: 11/04/2024    The patient will be independent in a safe self progression of a home exercise program to promote further recovery of function  Baseline:  Goal status: INITIAL  2.  The patient will have improved hip strength to at least 4+/5 needed for standing, walking longer distances and descending stairs at home and in the community  Baseline:  Goal status: INITIAL  3.  The patient will be able to walk her dogs medium distances Baseline:  Goal status: INITIAL  4.  The patient will be able to stoop or squat in order to pick up objects from the ground (needed for eventual return to gardening) Baseline:  Goal status: INITIAL  5.  LEFS improved to   35 /80 indicating improved function with less pain Baseline:  Goal status: INITIAL    PLAN:  PT FREQUENCY: 1-2x/week  PT DURATION: 8 weeks  PLANNED INTERVENTIONS: 97164- PT Re-evaluation, 97750- Physical Performance Testing, 97110-Therapeutic exercises, 97530- Therapeutic activity, 97112- Neuromuscular re-education, 97535- Self Care, [id number]- Manual therapy, [phone]- Gait training, Patient/Family education, Stair training, Taping, Cryotherapy, and Moist heat  PLAN FOR NEXT SESSION: hip strengthening particular gluteals, progress HEP; Nu-Step; proprioceptive ex's particularly with single leg weight bearing  Glade Pesa, PT 09/09/24 7:44 PM Phone: [phone] Fax: [phone]

## 2024-09-10 MED ORDER — HYDROCODONE-ACETAMINOPHEN 5-325 MG PO TABS: 1.0000 | ORAL_TABLET | ORAL | 0 refills | Status: DC | PRN

## 2024-09-10 NOTE — Addendum Note (Signed)
 Addended by: GEOFM GLADE PARAS on: 09/10/2024 09:10 PM   Modules accepted: Orders

## 2024-09-16 ENCOUNTER — Encounter: Payer: Self-pay | Admitting: Physical Therapy

## 2024-09-16 ENCOUNTER — Ambulatory Visit

## 2024-09-16 ENCOUNTER — Ambulatory Visit: Admitting: Physical Therapy

## 2024-09-16 DIAGNOSIS — Z471 Aftercare following joint replacement surgery: Secondary | ICD-10-CM | POA: Diagnosis not present

## 2024-09-16 DIAGNOSIS — M79672 Pain in left foot: Secondary | ICD-10-CM | POA: Diagnosis not present

## 2024-09-16 DIAGNOSIS — R262 Difficulty in walking, not elsewhere classified: Secondary | ICD-10-CM

## 2024-09-16 DIAGNOSIS — M25552 Pain in left hip: Secondary | ICD-10-CM

## 2024-09-16 DIAGNOSIS — R252 Cramp and spasm: Secondary | ICD-10-CM

## 2024-09-16 NOTE — Therapy (Signed)
 OUTPATIENT PHYSICAL THERAPY LOWER EXTREMITY TREATMENT   Patient Name: Crystal Barnes MRN: [id number] DOB:01-08-1951, 73 y.o., female Today's Date: 09/16/2024  END OF SESSION:  PT End of Session - 09/16/24 1105     Visit Number 2    Date for Recertification  11/04/24    Authorization Type [employer] no auth needed    Progress Note Due on Visit 10    PT Start Time 1102    PT Stop Time 1142    PT Time Calculation (min) 40 min    Activity Tolerance Patient tolerated treatment well    Behavior During Therapy WFL for tasks assessed/performed           Past Medical History:  Diagnosis Date   Arthritis    CAD (coronary artery disease)    Cataract    bilateral-removed   Hyperlipidemia    Hypertension    Hypothyroidism    Myocardial infarction (HCC) 06/01/2013   NSTEMI, s/p DES mRCA, 60% mLAD treated medically   Restless leg syndrome    Thyroid  disease    Past Surgical History:  Procedure Laterality Date   ABDOMINAL HYSTERECTOMY  2008   CATARACT EXTRACTION Bilateral    COLONOSCOPY     CORONARY STENT PLACEMENT  2014   ENDOSCOPIC MUCOSAL RESECTION N/A 11/24/2019   Procedure: ENDOSCOPIC MUCOSAL RESECTION;  Surgeon: Wilhelmenia Aloha Raddle., MD;  Location: [facility] ENDOSCOPY;  Service: Gastroenterology;  Laterality: N/A;   ENDOSCOPIC MUCOSAL RESECTION  08/04/2020   Procedure: ENDOSCOPIC MUCOSAL RESECTION;  Surgeon: Wilhelmenia Aloha Raddle., MD;  Location: [name]COPY;  Service: Gastroenterology;;   ENID SIGMOIDOSCOPY N/A 11/24/2019   Procedure: ENID MORIN;  Surgeon: Wilhelmenia Aloha Raddle., MD;  Location: [facility] ENDOSCOPY;  Service: Gastroenterology;  Laterality: N/A;   FLEXIBLE SIGMOIDOSCOPY N/A 08/04/2020   Procedure: FLEXIBLE SIGMOIDOSCOPY;  Surgeon: Wilhelmenia Aloha Raddle., MD;  Location: [name] ENDOSCOPY;  Service: Gastroenterology;  Laterality: N/A;   HEMOSTASIS CLIP PLACEMENT  11/24/2019   Procedure: HEMOSTASIS CLIP PLACEMENT;  Surgeon: Wilhelmenia Aloha Raddle.,  MD;  Location: [facility] ENDOSCOPY;  Service: Gastroenterology;;   HEMOSTASIS CLIP PLACEMENT  08/04/2020   Procedure: HEMOSTASIS CLIP PLACEMENT;  Surgeon: Wilhelmenia Aloha Raddle., MD;  Location: [name];  Service: Gastroenterology;;   LASIK Bilateral    POLYPECTOMY     SUBMUCOSAL LIFTING INJECTION  11/24/2019   Procedure: SUBMUCOSAL LIFTING INJECTION;  Surgeon: Wilhelmenia Aloha Raddle., MD;  Location: [facility] ENDOSCOPY;  Service: Gastroenterology;;   SUBMUCOSAL LIFTING INJECTION  08/04/2020   Procedure: SUBMUCOSAL LIFTING INJECTION;  Surgeon: Wilhelmenia Aloha Raddle., MD;  Location: [name]OS[facility];  Service: Gastroenterology;;   TONSILLECTOMY     TOTAL HIP ARTHROPLASTY Left 08/18/2024   Procedure: ARTHROPLASTY, HIP, TOTAL, ANTERIOR APPROACH;  Surgeon: Jerri Kay HERO, MD;  Location: [facility] OR;  Service: Orthopedics;  Laterality: Left;  3-C   Patient Active Problem List   Diagnosis Date Noted   Paronychia of right little finger 08/21/2024   Status post total replacement of left hip 08/18/2024   Stage 2 skin ulcer of sacral region (HCC) 08/14/2024   Chronic hip pain, left 08/14/2024   Preop examination 06/03/2024   Primary osteoarthritis of left hip 05/13/2024   Mid back pain on right side 11/29/2023   Iron deficiency 01/07/2023   Hyperkalemia 05/15/2022   History of rectal polypectomy 06/06/2020   Serrated adenoma of colon 06/06/2020   Hx of adenomatous colonic polyps 06/06/2020   Myocardial infarction [facility])    Arthralgia of both hands 06/26/2019   CAD (coronary artery disease) 02/10/2019  HTN (hypertension) 02/10/2019   Hypothyroidism (acquired) 02/10/2019   Hyperlipidemia, mixed 02/10/2019   Osteopenia 02/10/2019   RLS (restless legs syndrome) 02/10/2019   Chronic pain-pelvic floor dysfunction 02/10/2019    PCP: Geofm Hastings MD  REFERRING PROVIDER: Jerri Moody MD  REFERRING DIAG: [phone] s/p total replacement left hip; M79.672 pain in left foot  THERAPY DIAG:  weakness Pain in left  hip  Difficulty in walking, not elsewhere classified  Cramp and spasm  Pain in left foot  Rationale for Evaluation and Treatment: Rehabilitation  ONSET DATE: 08/18/24  SUBJECTIVE:   SUBJECTIVE STATEMENT: I'm doing so much better.  I've been walking my dogs some. A little pain in ant thigh at night.   Left THR anterior approach Aug 18, 2024 No assistive device.    PERTINENT HISTORY: Restless legs left > right long duration (going to see a specialist in July) Cardiac stent 10 years ago Has seen [facility] for pelvic floor PT helped a great deal PAIN:   Are you having pain? Yes NPRS scale: 0/10 Pain location: anterior pelvis and incision Pain orientation: Left  PAIN TYPE: aching Pain description: intermittent  Aggravating factors: too much activity Relieving factors: rest   PRECAUTIONS: None, no swimming for 3 months   WEIGHT BEARING RESTRICTIONS: No  FALLS:  Has patient fallen in last 6 months? 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   PATIENT SURVEYS:  LEFS  Extreme difficulty/unable (0), Quite a bit of difficulty (1), Moderate difficulty (2), Little difficulty (3), No difficulty (4) Survey date:  12/2  Any of your usual work, housework or school activities 0  2. Usual hobbies, recreational or sporting activities 0  3. Getting into/out of the bath 0  4. Walking between rooms 4  5. Putting on socks/shoes 3  6. Squatting  0  7. Lifting an object, like a bag of groceries from the floor 3  8. Performing light activities around your  home 3  9. Performing heavy activities around your home 0  10. Getting into/out of a car 2  11. Walking 2 blocks 1  12. Walking 1 mile 0  13. Going up/down 10 stairs (1 flight) 0  14. Standing for 1 hour 0  15.  sitting for 1 hour 0  16. Running on even ground 0  17. Running on uneven ground 0  18. Making sharp turns while running fast 0  19. Hopping  0  20. Rolling over in bed 1  Score total:  17/80     COGNITION: Overall cognitive status: Within functional limits for tasks assessed      EDEMA:  Mild edema lateral to incision. Large bandaid covering middle of incision but top of bottom of incision appear to be healing well.  No redness   LOWER EXTREMITY ROM: left hip flexion 90 degrees, extension 5 degrees   LOWER EXTREMITY MMT:   MMT Right eval Left eval  Hip flexion 5 4-  Hip extension 5 4-  Hip abduction 5 4-  Hip adduction    Hip internal rotation  Hip external rotation    Knee flexion 5 4  Knee extension 5 4  Ankle dorsiflexion    Ankle plantarflexion    Ankle inversion    Ankle eversion     (Blank rows = not tested)  LOWER EXTREMITY SPECIAL TESTS:  15 sec SLS on left  FUNCTIONAL TESTS:  5x STS 9.16 sec no hands assist TUG 8.17 3 MWT 600 feet no device Up and down 4 steps reciprocally with 2 railings light support  GAIT: Comments: with fatigue after 1.5 minutes of walking decreased stance time noted on left and mild pelvic drop                                                                                                                                TREATMENT DATE:  09/16/24 Nustep L 6 x 6 min with PT present to discuss status Heel raise to mini squat x 5, then single leg x 5 on L. Standing marching x 10 B Standing hip ABD x 15 B cues to go slower Standing HS curl x 15 B Sit to stand x 10, then x 5 Seated marching x 15 B Seated LAQ x 15 Supine heel slides x 15 Supine SAQ x 20 B Supine small range SLR x 15 L SLS on L SLS with 3 way  reach (in air) to fatigue intermittent UE support Rockerboard PF/DF Gastroc stretch - reviewed advised to hold longer    09/09/24 Evaluation Discussion of expected course of recovery Discussed gradual increase of walking by small increments  Review of HEP given by home PT: Standing counter ex's: heel raises, seated yellow band clams, hip abduction, hip extension, standing hip circles  PATIENT EDUCATION:  Education details: Educated patient on anatomy and physiology of current symptoms, prognosis, plan of care as well as initial self care strategies to promote recovery Person educated: Patient Education method: Explanation Education comprehension: verbalized understanding  HOME EXERCISE PROGRAM: TAccess Code: [email or web address] URL: [email or web address] Date: 09/16/2024 Prepared by: Mliss  Exercises - Supine Heel Slides  - 1 x daily - 7 x weekly - 3 sets - 10 reps - Supine Short Arc Quad  - 1 x daily - 7 x weekly - 3 sets - 10 reps - 3-5 sec hold - Small Range Straight Leg Raise  - 1 x daily - 7 x weekly - 2 sets - 10 reps - Seated Long Arc Quad  - 1 x daily - 7 x weekly - 3 sets - 10 reps - 3-5 sec hold - Seated March  - 1 x daily - 7 x weekly - 3 sets - 10 reps - Single Leg Stance  - 1 x daily - 3 x weekly - 1 sets - 10 reps - max  hold - Single Leg 3 Way Reach with Support  - 1 x daily - 3 x weekly - 2 sets - 10 repso be progressed  ASSESSMENT:  CLINICAL IMPRESSION: Patient returns  for first f/u visit. She is very motivated. She needs some cues to slow down and we discussed that her hip is healing and that she shouldn't be too aggressive with exercise. She tolerated all therex without any significant pain. She was challenged with balance activities. HEP from HHPT was reviewed and new HEP was issued today. Patient denies any foot pain, so we will continue to focus on hip rehab.    Eval: Patient is a 73  y.o. female who was seen today for physical therapy evaluation and  treatment for s/p left anterior approach THR on 11/10.  She also has a referral for left foot pain which was present yesterday but has resolved today.  She is ambulating without an assistive device.  With short distances she is ambulating well although she fatigues quickly and within 1.5 minutes decreased stance time on left and pelvic drop noted.  She lacks the strength in gluteals needed for standing and walking in the community and to be able to work in her yard.  She is limited in performance of household chores and she has not attempted to walk her 2 dogs.    OBJECTIVE IMPAIRMENTS: decreased activity tolerance, decreased mobility, difficulty walking, decreased ROM, decreased strength, impaired perceived functional ability, and pain.   ACTIVITY LIMITATIONS: carrying, lifting, bending, standing, squatting, bed mobility, hygiene/grooming, and locomotion level  PARTICIPATION LIMITATIONS: meal prep, cleaning, shopping, community activity, and yard work  PERSONAL FACTORS: 1 comorbidity: Restless legs, cardiac history are also affecting patient's functional outcome.   REHAB POTENTIAL: Good  CLINICAL DECISION MAKING: Stable/uncomplicated  EVALUATION COMPLEXITY: Low   GOALS: Goals reviewed with patient? Yes  SHORT TERM GOALS: Target date: 10/07/2024   The patient will demonstrate knowledge of basic self care strategies and exercises to promote healing  Baseline: Goal status: INITIAL  2.  The patient will have improved gait stamina and speed needed to ambulate 1200 feet in 6 minutes  Baseline:  Goal status: INITIAL  3.  The patient will be able to turn/roll in bed with minimal to no difficulty Baseline:  Goal status: INITIAL  4. The patient will report a 50% improvement in pain levels with functional activities  INITIAL     LONG TERM GOALS: Target date: 11/04/2024    The patient will be independent in a safe self progression of a home exercise program to promote further recovery  of function  Baseline:  Goal status: INITIAL  2.  The patient will have improved hip strength to at least 4+/5 needed for standing, walking longer distances and descending stairs at home and in the community  Baseline:  Goal status: INITIAL  3.  The patient will be able to walk her dogs medium distances Baseline:  Goal status: INITIAL  4.  The patient will be able to stoop or squat in order to pick up objects from the ground (needed for eventual return to gardening) Baseline:  Goal status: INITIAL  5.  LEFS improved to   35 /80 indicating improved function with less pain Baseline:  Goal status: INITIAL    PLAN:  PT FREQUENCY: 1-2x/week  PT DURATION: 8 weeks  PLANNED INTERVENTIONS: 97164- PT Re-evaluation, 97750- Physical Performance Testing, 97110-Therapeutic exercises, 97530- Therapeutic activity, 97112- Neuromuscular re-education, 97535- Self Care, [id number]- Manual therapy, [phone]- Gait training, Patient/Family education, Stair training, Taping, Cryotherapy, and Moist heat  PLAN FOR NEXT SESSION: hip strengthening particular gluteals (add bridges), progress HEP; Nu-Step; proprioceptive ex's particularly with single leg weight bearing  Mliss Cummins, PT  09/16/24  11:54 AM Phone: [phone] Fax: [phone]

## 2024-09-17 MED ORDER — TRAMADOL HCL 50 MG PO TABS: 50.0000 mg | ORAL_TABLET | Freq: Three times a day (TID) | ORAL | 0 refills | Status: DC | PRN

## 2024-09-17 NOTE — Addendum Note (Signed)
 Addended by: GEOFM GLADE PARAS on: 09/17/2024 08:42 PM   Modules accepted: Orders

## 2024-09-19 ENCOUNTER — Encounter: Payer: Self-pay | Admitting: Physical Therapy

## 2024-09-19 ENCOUNTER — Ambulatory Visit: Admitting: Physical Therapy

## 2024-09-19 DIAGNOSIS — Z471 Aftercare following joint replacement surgery: Secondary | ICD-10-CM | POA: Diagnosis not present

## 2024-09-19 DIAGNOSIS — M6281 Muscle weakness (generalized): Secondary | ICD-10-CM

## 2024-09-19 DIAGNOSIS — R262 Difficulty in walking, not elsewhere classified: Secondary | ICD-10-CM

## 2024-09-19 DIAGNOSIS — M79672 Pain in left foot: Secondary | ICD-10-CM

## 2024-09-19 DIAGNOSIS — M25552 Pain in left hip: Secondary | ICD-10-CM

## 2024-09-19 DIAGNOSIS — R252 Cramp and spasm: Secondary | ICD-10-CM

## 2024-09-19 NOTE — Therapy (Signed)
 OUTPATIENT PHYSICAL THERAPY LOWER EXTREMITY TREATMENT   Patient Name: Crystal Barnes MRN: [id number] DOB:08-Aug-1951, 73 y.o., female Today's Date: 09/19/2024  END OF SESSION:  PT End of Session - 09/19/24 0921     Visit Number 3    Date for Recertification  11/04/24    Authorization Type [employer] no auth needed    Progress Note Due on Visit 10    PT Start Time 0920    PT Stop Time 1015    PT Time Calculation (min) 55 min    Activity Tolerance Patient tolerated treatment well    Behavior During Therapy [facility] for tasks assessed/performed            Past Medical History:  Diagnosis Date   Arthritis    CAD (coronary artery disease)    Cataract    bilateral-removed   Hyperlipidemia    Hypertension    Hypothyroidism    Myocardial infarction (HCC) 06/01/2013   NSTEMI, s/p DES mRCA, 60% mLAD treated medically   Restless leg syndrome    Thyroid  disease    Past Surgical History:  Procedure Laterality Date   ABDOMINAL HYSTERECTOMY  2008   CATARACT EXTRACTION Bilateral    COLONOSCOPY     CORONARY STENT PLACEMENT  2014   ENDOSCOPIC MUCOSAL RESECTION N/A 11/24/2019   Procedure: ENDOSCOPIC MUCOSAL RESECTION;  Surgeon: Wilhelmenia Aloha Raddle., MD;  Location: [facility] ENDOSCOPY;  Service: Gastroenterology;  Laterality: N/A;   ENDOSCOPIC MUCOSAL RESECTION  08/04/2020   Procedure: ENDOSCOPIC MUCOSAL RESECTION;  Surgeon: Wilhelmenia Aloha Raddle., MD;  Location: [name] ENDOSCOPY;  Service: Gastroenterology;;   ENID SIGMOIDOSCOPY N/A 11/24/2019   Procedure: ENID MORIN;  Surgeon: Wilhelmenia Aloha Raddle., MD;  Location: [facility] ENDOSCOPY;  Service: Gastroenterology;  Laterality: N/A;   FLEXIBLE SIGMOIDOSCOPY N/A 08/04/2020   Procedure: FLEXIBLE SIGMOIDOSCOPY;  Surgeon: Wilhelmenia Aloha Raddle., MD;  Location: [name] ENDOSCOPY;  Service: Gastroenterology;  Laterality: N/A;   HEMOSTASIS CLIP PLACEMENT  11/24/2019   Procedure: HEMOSTASIS CLIP PLACEMENT;  Surgeon: Wilhelmenia Aloha Raddle., MD;  Location: [facility] ENDOSCOPY;  Service: Gastroenterology;;   HEMOSTASIS CLIP PLACEMENT  08/04/2020   Procedure: HEMOSTASIS CLIP PLACEMENT;  Surgeon: Wilhelmenia Aloha Raddle., MD;  Location: [name] ENDOSCOPY;  Service: Gastroenterology;;   LASIK Bilateral    POLYPECTOMY     SUBMUCOSAL LIFTING INJECTION  11/24/2019   Procedure: SUBMUCOSAL LIFTING INJECTION;  Surgeon: Wilhelmenia Aloha Raddle., MD;  Location: [facility] ENDOSCOPY;  Service: Gastroenterology;;   SUBMUCOSAL LIFTING INJECTION  08/04/2020   Procedure: SUBMUCOSAL LIFTING INJECTION;  Surgeon: Wilhelmenia Aloha Raddle., MD;  Location: [name] ENDOSCOPY;  Service: Gastroenterology;;   TONSILLECTOMY     TOTAL HIP ARTHROPLASTY Left 08/18/2024   Procedure: ARTHROPLASTY, HIP, TOTAL, ANTERIOR APPROACH;  Surgeon: Jerri Kay HERO, MD;  Location: [facility] OR;  Service: Orthopedics;  Laterality: Left;  3-C   Patient Active Problem List   Diagnosis Date Noted   Paronychia of right little finger 08/21/2024   Status post total replacement of left hip 08/18/2024   Stage 2 skin ulcer of sacral region (HCC) 08/14/2024   Chronic hip pain, left 08/14/2024   Preop examination 06/03/2024   Primary osteoarthritis of left hip 05/13/2024   Mid back pain on right side 11/29/2023   Iron deficiency 01/07/2023   Hyperkalemia 05/15/2022   History of rectal polypectomy 06/06/2020   Serrated adenoma of colon 06/06/2020   Hx of adenomatous colonic polyps 06/06/2020   Myocardial infarction [facility])    Arthralgia of both hands 06/26/2019   CAD (coronary artery disease) 02/10/2019  HTN (hypertension) 02/10/2019   Hypothyroidism (acquired) 02/10/2019   Hyperlipidemia, mixed 02/10/2019   Osteopenia 02/10/2019   RLS (restless legs syndrome) 02/10/2019   Chronic pain-pelvic floor dysfunction 02/10/2019    PCP: Geofm Hastings MD  REFERRING PROVIDER: Jerri Moody MD  REFERRING DIAG: [phone] s/p total replacement left hip; M79.672 pain in left foot  THERAPY DIAG:  weakness Pain in  left hip  Difficulty in walking, not elsewhere classified  Cramp and spasm  Pain in left foot  Muscle weakness (generalized)  Rationale for Evaluation and Treatment: Rehabilitation  ONSET DATE: 08/18/24  SUBJECTIVE:   SUBJECTIVE STATEMENT: I am doing some but not all of the HEP.   Left THR anterior approach Aug 18, 2024 No assistive device.    PERTINENT HISTORY: Restless legs left > right long duration (going to see a specialist in July) Cardiac stent 10 years ago Has seen [facility] for pelvic floor PT helped a great deal PAIN:   Are you having pain? Yes NPRS scale: 3/10 outside of Lt ankle Pain location: anterior pelvis and incision Pain orientation: Left  PAIN TYPE: aching Pain description: intermittent  Aggravating factors: too much activity Relieving factors: rest   PRECAUTIONS: None, no swimming for 3 months   WEIGHT BEARING RESTRICTIONS: No  FALLS:  Has patient fallen in last 6 months? 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   PATIENT SURVEYS:  LEFS  Extreme difficulty/unable (0), Quite a bit of difficulty (1), Moderate difficulty (2), Little difficulty (3), No difficulty (4) Survey date:  12/2  Any of your usual work, housework or school activities 0  2. Usual hobbies, recreational or sporting activities 0  3. Getting into/out of the bath 0  4. Walking between rooms 4  5. Putting on socks/shoes 3  6. Squatting  0  7. Lifting an object, like a bag of groceries from the floor 3  8. Performing light activities around  your home 3  9. Performing heavy activities around your home 0  10. Getting into/out of a car 2  11. Walking 2 blocks 1  12. Walking 1 mile 0  13. Going up/down 10 stairs (1 flight) 0  14. Standing for 1 hour 0  15.  sitting for 1 hour 0  16. Running on even ground 0  17. Running on uneven ground 0  18. Making sharp turns while running fast 0  19. Hopping  0  20. Rolling over in bed 1  Score total:  17/80     COGNITION: Overall cognitive status: Within functional limits for tasks assessed      EDEMA:  Mild edema lateral to incision. Large bandaid covering middle of incision but top of bottom of incision appear to be healing well.  No redness   LOWER EXTREMITY ROM: left hip flexion 90 degrees, extension 5 degrees   LOWER EXTREMITY MMT:   MMT Right eval Left eval  Hip flexion 5 4-  Hip extension 5 4-  Hip abduction 5 4-  Hip adduction    Hip internal rotation    Hip external  rotation    Knee flexion 5 4  Knee extension 5 4  Ankle dorsiflexion    Ankle plantarflexion    Ankle inversion    Ankle eversion     (Blank rows = not tested)  LOWER EXTREMITY SPECIAL TESTS:  15 sec SLS on left  FUNCTIONAL TESTS:  5x STS 9.16 sec no hands assist TUG 8.17 3 MWT 600 feet no device Up and down 4 steps reciprocally with 2 railings light support  GAIT: Comments: with fatigue after 1.5 minutes of walking decreased stance time noted on left and mild pelvic drop                                                                                                                                TREATMENT DATE:  09/19/24 NuStep L6 x 6' PT present to discuss status 2lb ankle weight: Lt hip abd and HS curl 2x12 each Bil 2lb ankle weight: alt march 2x20 LAQ 4lb 10x3, reduced to 3lb x10 Sit to stand x12 Left SLS 2x30 Lt SLS with 3-way toe taps Rt leg Supine bridge x20 Supine static bridge with alt LE marching x10 Discussion of exercise principles for strengthening as well as  pairing strength training with protein intake (20g within 30 min of exercise) Benefits of loading for bone density   09/16/24 Nustep L 6 x 6 min with PT present to discuss status Heel raise to mini squat x 5, then single leg x 5 on L. Standing marching x 10 B Standing hip ABD x 15 B cues to go slower Standing HS curl x 15 B Sit to stand x 10, then x 5 Seated marching x 15 B Seated LAQ x 15 Supine heel slides x 15 Supine SAQ x 20 B Supine small range SLR x 15 L SLS on L SLS with 3 way reach (in air) to fatigue intermittent UE support Rockerboard PF/DF Gastroc stretch - reviewed advised to hold longer    09/09/24 Evaluation Discussion of expected course of recovery Discussed gradual increase of walking by small increments  Review of HEP given by home PT: Standing counter ex's: heel raises, seated yellow band clams, hip abduction, hip extension, standing hip circles  PATIENT EDUCATION:  Education details: Educated patient on anatomy and physiology of current symptoms, prognosis, plan of care as well as initial self care strategies to promote recovery Person educated: Patient Education method: Explanation Education comprehension: verbalized understanding  HOME EXERCISE PROGRAM: Access Code: [email or web address] URL: [email or web address] Date: 09/19/2024 Prepared by: Orvil Faron Whitelock  Exercises - Supine Heel Slides  - 1 x daily - 7 x weekly - 1 sets - 10 reps - Small Range Straight Leg Raise  - 1 x daily - 7 x weekly - 2 sets - 10 reps - Supine Bridge  - 1 x daily - 7 x weekly - 1 sets - 10 reps - Airline Pilot with Eccentric Lowering  - 1 x daily - 7 x  weekly - 2 sets - 10 reps - Seated Long Arc Quad  - 1 x daily - 7 x weekly - 3 sets - 10 reps - 3-5 sec hold - Single Leg Stance  - 1 x daily - 3 x weekly - 1 sets - 10 reps - max  hold - Single Leg 3 Way Reach with Support  - 1 x daily - 3 x weekly - 2 sets - 10 reps - Standing March with Counter Support  - 1 x daily -  7 x weekly - 2 sets - 20 reps - Standing Hip Abduction with Counter Support  - 1 x daily - 7 x weekly - 3 sets - 10 reps - Sit to Stand with Arms Crossed  - 1 x daily - 7 x weekly - 2 sets - 10 reps  ASSESSMENT:  CLINICAL IMPRESSION: Patient is very motivated and we discussed that she may back down to 1x/week due to cost concerns and given how motivated the patient is.  We advanced her loading for therex to benefit bone density and strength goals.  We rearranged her HEP by position and encouraged her to pair aspects of her program with meals to approach throughout the day for compliance.  We discussed intake of 20g of protein within 30 min of strength training to support muscle growth.  She asked about wearing her weighted vest with dog walks which will benefit her for the spinal loading as long as she doesn't have pain.  She needed cues today with sit to stand to incorporate hip hinge and to avoid over-thrust of pelvis upon standing but rather to stand tall toward ceiling.  Continue along POC.   Eval: Patient is a 74  y.o. female who was seen today for physical therapy evaluation and treatment for s/p left anterior approach THR on 11/10.  She also has a referral for left foot pain which was present yesterday but has resolved today.  She is ambulating without an assistive device.  With short distances she is ambulating well although she fatigues quickly and within 1.5 minutes decreased stance time on left and pelvic drop noted.  She lacks the strength in gluteals needed for standing and walking in the community and to be able to work in her yard.  She is limited in performance of household chores and she has not attempted to walk her 2 dogs.    OBJECTIVE IMPAIRMENTS: decreased activity tolerance, decreased mobility, difficulty walking, decreased ROM, decreased strength, impaired perceived functional ability, and pain.   ACTIVITY LIMITATIONS: carrying, lifting, bending, standing, squatting, bed  mobility, hygiene/grooming, and locomotion level  PARTICIPATION LIMITATIONS: meal prep, cleaning, shopping, community activity, and yard work  PERSONAL FACTORS: 1 comorbidity: Restless legs, cardiac history are also affecting patient's functional outcome.   REHAB POTENTIAL: Good  CLINICAL DECISION MAKING: Stable/uncomplicated  EVALUATION COMPLEXITY: Low   GOALS: Goals reviewed with patient? Yes  SHORT TERM GOALS: Target date: 10/07/2024   The patient will demonstrate knowledge of basic self care strategies and exercises to promote healing  Baseline: Goal status: INITIAL  2.  The patient will have improved gait stamina and speed needed to ambulate 1200 feet in 6 minutes  Baseline:  Goal status: INITIAL  3.  The patient will be able to turn/roll in bed with minimal to no difficulty Baseline:  Goal status: INITIAL  4. The patient will report a 50% improvement in pain levels with functional activities  INITIAL     LONG TERM GOALS: Target date:  11/04/2024    The patient will be independent in a safe self progression of a home exercise program to promote further recovery of function  Baseline:  Goal status: INITIAL  2.  The patient will have improved hip strength to at least 4+/5 needed for standing, walking longer distances and descending stairs at home and in the community  Baseline:  Goal status: INITIAL  3.  The patient will be able to walk her dogs medium distances Baseline:  Goal status: INITIAL  4.  The patient will be able to stoop or squat in order to pick up objects from the ground (needed for eventual return to gardening) Baseline:  Goal status: INITIAL  5.  LEFS improved to   35 /80 indicating improved function with less pain Baseline:  Goal status: INITIAL    PLAN:  PT FREQUENCY: 1-2x/week  PT DURATION: 8 weeks  PLANNED INTERVENTIONS: 97164- PT Re-evaluation, 97750- Physical Performance Testing, 97110-Therapeutic exercises, 97530- Therapeutic  activity, 97112- Neuromuscular re-education, 97535- Self Care, [id number]- Manual therapy, [phone]- Gait training, Patient/Family education, Stair training, Taping, Cryotherapy, and Moist heat  PLAN FOR NEXT SESSION: hip strengthening particular gluteals (add bridges), progress HEP; Nu-Step; proprioceptive ex's particularly with single leg weight bearing  Renda Pohlman, PT 09/19/2024 10:22 AM  Phone: [phone] Fax: [phone]

## 2024-09-22 ENCOUNTER — Other Ambulatory Visit: Payer: Self-pay

## 2024-09-23 ENCOUNTER — Ambulatory Visit

## 2024-09-24 ENCOUNTER — Other Ambulatory Visit

## 2024-09-24 DIAGNOSIS — Z96642 Presence of left artificial hip joint: Secondary | ICD-10-CM

## 2024-09-24 DIAGNOSIS — M810 Age-related osteoporosis without current pathological fracture: Secondary | ICD-10-CM | POA: Diagnosis not present

## 2024-09-24 DIAGNOSIS — M25552 Pain in left hip: Secondary | ICD-10-CM | POA: Diagnosis not present

## 2024-09-24 DIAGNOSIS — G8929 Other chronic pain: Secondary | ICD-10-CM | POA: Diagnosis not present

## 2024-09-25 ENCOUNTER — Ambulatory Visit: Admitting: Physician Assistant

## 2024-09-25 ENCOUNTER — Encounter: Payer: Self-pay | Admitting: Physical Therapy

## 2024-09-25 ENCOUNTER — Other Ambulatory Visit: Payer: Self-pay

## 2024-09-25 ENCOUNTER — Ambulatory Visit: Admitting: Physical Therapy

## 2024-09-25 ENCOUNTER — Ambulatory Visit: Payer: Self-pay

## 2024-09-25 DIAGNOSIS — M6281 Muscle weakness (generalized): Secondary | ICD-10-CM

## 2024-09-25 DIAGNOSIS — Z96642 Presence of left artificial hip joint: Secondary | ICD-10-CM

## 2024-09-25 DIAGNOSIS — R252 Cramp and spasm: Secondary | ICD-10-CM

## 2024-09-25 DIAGNOSIS — R262 Difficulty in walking, not elsewhere classified: Secondary | ICD-10-CM

## 2024-09-25 DIAGNOSIS — Z471 Aftercare following joint replacement surgery: Secondary | ICD-10-CM | POA: Diagnosis not present

## 2024-09-25 DIAGNOSIS — M810 Age-related osteoporosis without current pathological fracture: Secondary | ICD-10-CM

## 2024-09-25 DIAGNOSIS — M25552 Pain in left hip: Secondary | ICD-10-CM

## 2024-09-25 DIAGNOSIS — G8929 Other chronic pain: Secondary | ICD-10-CM

## 2024-09-25 DIAGNOSIS — M79672 Pain in left foot: Secondary | ICD-10-CM | POA: Diagnosis not present

## 2024-09-25 MED ORDER — AMOXICILLIN 500 MG PO CAPS: ORAL_CAPSULE | ORAL | 2 refills | Status: AC

## 2024-09-25 NOTE — Progress Notes (Signed)
 Post-Op Visit Note   Patient: Crystal Barnes           Date of Birth: 1951/06/18           MRN: [id number] Visit Date: 09/25/2024 PCP: Geofm Glade PARAS, MD   Assessment & Plan:  Chief Complaint:  Chief Complaint  Patient presents with   Left Hip - Follow-up    Left THA 08/18/2024   Visit Diagnoses:  1. Status post total replacement of left hip     Plan: Patient is a pleasant 73 year old female who comes in today 6 weeks status post left total hip replacement 08/18/2024.  She has been doing much better.  She has been in physical therapy and making good progress.  She has been taking baby aspirin  once daily.  She tells me she underwent a DEXA scan yesterday and has been diagnosed with osteoporosis.  Examination of the left hip reveals painless hip flexion and logroll.  She is neurovascularly intact distally.  At this point, she will continue to advance with activity as tolerated.  Dental prophylaxis reinforced.  We have sent a referral to [facility] for osteoporosis clinic.  Follow-up in 6 weeks for recheck of the right hip.  Call with concerns or questions.  Follow-Up Instructions: Return in about 6 weeks (around 11/06/2024).   Orders:  Orders Placed This Encounter  Procedures   XR Pelvis 1-2 Views   Meds ordered this encounter  Medications   amoxicillin  (AMOXIL ) 500 MG capsule    Sig: Take 4 pills one hour prior to dental work    Dispense:  8 capsule    Refill:  2    Imaging: XR Pelvis 1-2 Views Result Date: 09/25/2024 Well-seated prosthesis without complication  DG BONE DENSITY (DXA) Result Date: 09/24/2024 EXAM: DUAL X-RAY ABSORPTIOMETRY (DXA) FOR BONE MINERAL DENSITY 09/24/2024 3:55 pm CLINICAL DATA:  73 year old Female Postmenopausal. Evaluate bone quality History of hip fracture. TECHNIQUE: An axial (e.g., hips, spine) and/or appendicular (e.g., radius) exam was performed, as appropriate, using GE Secretary/administrator at [employer]. Images are  obtained for bone mineral density measurement and are not obtained for diagnostic purposes. [id number] Exclusions: Left hip due to fracture. COMPARISON:  None. New baseline. FINDINGS: Scan quality: Good. LUMBAR SPINE (L1-L4): BMD (in g/cm2): 1.051 T-score: -1.1 Z-score: 0.6 RIGHT FEMORAL NECK: BMD (in g/cm2): 0.714 T-score: -2.3 Z-score: -0.6 RIGHT TOTAL HIP: BMD (in g/cm2): 0.782 T-score: -1.8 Z-score: -0.3 LEFT FOREARM (RADIUS 33%): BMD (in g/cm2): 0.656 T-score: -2.5 Z-score: -0.6 FRAX 10-YEAR PROBABILITY OF FRACTURE: FRAX not reported as the lowest BMD is not in the osteopenia range. IMPRESSION: Osteoporosis based on BMD. Fracture risk is increased. Increased risk is based on low BMD and history of hip fracture. RECOMMENDATIONS: 1. All patients should optimize calcium  and vitamin D  intake. 2. Consider FDA-approved medical therapies in postmenopausal women and men aged 69 years and older, based on the following: - A hip or vertebral (clinical or morphometric) fracture - T-score less than or equal to -2.5 and secondary causes have been excluded. - Low bone mass (T-score between -1.0 and -2.5) and a 10-year probability of a hip fracture greater than or equal to 3% or a 10-year probability of a major osteoporosis-related fracture greater than or equal to 20% based on the [place] -adapted WHO algorithm. - Clinician judgment and/or patient preferences may indicate treatment for people with 10-year fracture probabilities above or below these levels 3. Patients with diagnosis of osteoporosis or at high risk for  fracture should have regular bone mineral density tests. For patients eligible for Medicare, routine testing is allowed once every 2 years. The testing frequency can be increased to one year for patients who have rapidly progressing disease, those who are receiving or discontinuing medical therapy to restore bone mass, or have additional risk factors. Electronically Signed   By: Dina  Arceo M.D.   On: 09/24/2024 18:06     PMFS History: Patient Active Problem List   Diagnosis Date Noted   Paronychia of right little finger 08/21/2024   Status post total replacement of left hip 08/18/2024   Stage 2 skin ulcer of sacral region (HCC) 08/14/2024   Chronic hip pain, left 08/14/2024   Preop examination 06/03/2024   Primary osteoarthritis of left hip 05/13/2024   Mid back pain on right side 11/29/2023   Iron deficiency 01/07/2023   Hyperkalemia 05/15/2022   History of rectal polypectomy 06/06/2020   Serrated adenoma of colon 06/06/2020   Hx of adenomatous colonic polyps 06/06/2020   Myocardial infarction (HCC)    Arthralgia of both hands 06/26/2019   CAD (coronary artery disease) 02/10/2019   HTN (hypertension) 02/10/2019   Hypothyroidism (acquired) 02/10/2019   Hyperlipidemia, mixed 02/10/2019   Osteopenia 02/10/2019   RLS (restless legs syndrome) 02/10/2019   Chronic pain-pelvic floor dysfunction 02/10/2019   Past Medical History:  Diagnosis Date   Arthritis    CAD (coronary artery disease)    Cataract    bilateral-removed   Hyperlipidemia    Hypertension    Hypothyroidism    Myocardial infarction (HCC) 06/01/2013   NSTEMI, s/p DES mRCA, 60% mLAD treated medically   Restless leg syndrome    Thyroid  disease     Family History  Problem Relation Age of Onset   Colon polyps Mother    Esophageal cancer Father    Colon cancer Neg Hx    Rectal cancer Neg Hx    Stomach cancer Neg Hx    Inflammatory bowel disease Neg Hx    Liver disease Neg Hx    Pancreatic cancer Neg Hx     Past Surgical History:  Procedure Laterality Date   ABDOMINAL HYSTERECTOMY  2008   CATARACT EXTRACTION Bilateral    COLONOSCOPY     CORONARY STENT PLACEMENT  2014   ENDOSCOPIC MUCOSAL RESECTION N/A 11/24/2019   Procedure: ENDOSCOPIC MUCOSAL RESECTION;  Surgeon: Wilhelmenia Aloha Raddle., MD;  Location: [facility] ENDOSCOPY;  Service: Gastroenterology;  Laterality: N/A;   ENDOSCOPIC MUCOSAL RESECTION  08/04/2020   Procedure:  ENDOSCOPIC MUCOSAL RESECTION;  Surgeon: Wilhelmenia Aloha Raddle., MD;  Location: [name] ENDOSCOPY;  Service: Gastroenterology;;   ENID SIGMOIDOSCOPY N/A 11/24/2019   Procedure: ENID MORIN;  Surgeon: Wilhelmenia Aloha Raddle., MD;  Location: [facility] ENDOSCOPY;  Service: Gastroenterology;  Laterality: N/A;   FLEXIBLE SIGMOIDOSCOPY N/A 08/04/2020   Procedure: FLEXIBLE SIGMOIDOSCOPY;  Surgeon: Wilhelmenia Aloha Raddle., MD;  Location: [name]COPY;  Service: Gastroenterology;  Laterality: N/A;   HEMOSTASIS CLIP PLACEMENT  11/24/2019   Procedure: HEMOSTASIS CLIP PLACEMENT;  Surgeon: Wilhelmenia Aloha Raddle., MD;  Location: [facility] ENDOSCOPY;  Service: Gastroenterology;;   HEMOSTASIS CLIP PLACEMENT  08/04/2020   Procedure: HEMOSTASIS CLIP PLACEMENT;  Surgeon: Wilhelmenia Aloha Raddle., MD;  Location: [name] ENDOSCOPY;  Service: Gastroenterology;;   LASIK Bilateral    POLYPECTOMY     SUBMUCOSAL LIFTING INJECTION  11/24/2019   Procedure: SUBMUCOSAL LIFTING INJECTION;  Surgeon: Wilhelmenia Aloha Raddle., MD;  Location: [facility] ENDOSCOPY;  Service: Gastroenterology;;   SUBMUCOSAL LIFTING INJECTION  08/04/2020   Procedure: SUBMUCOSAL LIFTING  INJECTION;  Surgeon: Wilhelmenia Aloha Raddle., MD;  Location: [name] ENDOSCOPY;  Service: Gastroenterology;;   TONSILLECTOMY     TOTAL HIP ARTHROPLASTY Left 08/18/2024   Procedure: ARTHROPLASTY, HIP, TOTAL, ANTERIOR APPROACH;  Surgeon: Jerri Kay HERO, MD;  Location: [facility] OR;  Service: Orthopedics;  Laterality: Left;  3-C   Social History   Occupational History   Occupation: Retired     Comment: about a year and half ago  Tobacco Use   Smoking status: Former    Types: Cigarettes   Smokeless tobacco: Never  Vaping Use   Vaping status: Never Used  Substance and Sexual Activity   Alcohol use: Not Currently   Drug use: Never   Sexual activity: Not Currently

## 2024-09-25 NOTE — Progress Notes (Signed)
 Please refer to Crystal Barnes for osteoporosis

## 2024-09-25 NOTE — Therapy (Signed)
 OUTPATIENT PHYSICAL THERAPY LOWER EXTREMITY TREATMENT   Patient Name: Crystal Barnes MRN: [id number] DOB:September 24, 1951, 73 y.o., female Today's Date: 09/25/2024  END OF SESSION:  PT End of Session - 09/25/24 1621     Visit Number 4    Date for Recertification  11/04/24    Authorization Type [employer] no auth needed    Progress Note Due on Visit 10    PT Start Time 1619    PT Stop Time 1703    PT Time Calculation (min) 44 min    Activity Tolerance Patient tolerated treatment well    Behavior During Therapy [facility] for tasks assessed/performed             Past Medical History:  Diagnosis Date   Arthritis    CAD (coronary artery disease)    Cataract    bilateral-removed   Hyperlipidemia    Hypertension    Hypothyroidism    Myocardial infarction (HCC) 06/01/2013   NSTEMI, s/p DES mRCA, 60% mLAD treated medically   Restless leg syndrome    Thyroid  disease    Past Surgical History:  Procedure Laterality Date   ABDOMINAL HYSTERECTOMY  2008   CATARACT EXTRACTION Bilateral    COLONOSCOPY     CORONARY STENT PLACEMENT  2014   ENDOSCOPIC MUCOSAL RESECTION N/A 11/24/2019   Procedure: ENDOSCOPIC MUCOSAL RESECTION;  Surgeon: Wilhelmenia Aloha Raddle., MD;  Location: [facility] ENDOSCOPY;  Service: Gastroenterology;  Laterality: N/A;   ENDOSCOPIC MUCOSAL RESECTION  08/04/2020   Procedure: ENDOSCOPIC MUCOSAL RESECTION;  Surgeon: Wilhelmenia Aloha Raddle., MD;  Location: [name] ENDOSCOPY;  Service: Gastroenterology;;   ENID SIGMOIDOSCOPY N/A 11/24/2019   Procedure: ENID MORIN;  Surgeon: Wilhelmenia Aloha Raddle., MD;  Location: [facility] ENDOSCOPY;  Service: Gastroenterology;  Laterality: N/A;   FLEXIBLE SIGMOIDOSCOPY N/A 08/04/2020   Procedure: FLEXIBLE SIGMOIDOSCOPY;  Surgeon: Wilhelmenia Aloha Raddle., MD;  Location: [name] ENDOSCOPY;  Service: Gastroenterology;  Laterality: N/A;   HEMOSTASIS CLIP PLACEMENT  11/24/2019   Procedure: HEMOSTASIS CLIP PLACEMENT;  Surgeon: Wilhelmenia Aloha Raddle., MD;  Location: [facility] ENDOSCOPY;  Service: Gastroenterology;;   HEMOSTASIS CLIP PLACEMENT  08/04/2020   Procedure: HEMOSTASIS CLIP PLACEMENT;  Surgeon: Wilhelmenia Aloha Raddle., MD;  Location: [name] ENDOSCOPY;  Service: Gastroenterology;;   LASIK Bilateral    POLYPECTOMY     SUBMUCOSAL LIFTING INJECTION  11/24/2019   Procedure: SUBMUCOSAL LIFTING INJECTION;  Surgeon: Wilhelmenia Aloha Raddle., MD;  Location: [facility] ENDOSCOPY;  Service: Gastroenterology;;   SUBMUCOSAL LIFTING INJECTION  08/04/2020   Procedure: SUBMUCOSAL LIFTING INJECTION;  Surgeon: Wilhelmenia Aloha Raddle., MD;  Location: [name] ENDOSCOPY;  Service: Gastroenterology;;   TONSILLECTOMY     TOTAL HIP ARTHROPLASTY Left 08/18/2024   Procedure: ARTHROPLASTY, HIP, TOTAL, ANTERIOR APPROACH;  Surgeon: Jerri Kay HERO, MD;  Location: [facility] OR;  Service: Orthopedics;  Laterality: Left;  3-C   Patient Active Problem List   Diagnosis Date Noted   Paronychia of right little finger 08/21/2024   Status post total replacement of left hip 08/18/2024   Stage 2 skin ulcer of sacral region (HCC) 08/14/2024   Chronic hip pain, left 08/14/2024   Preop examination 06/03/2024   Primary osteoarthritis of left hip 05/13/2024   Mid back pain on right side 11/29/2023   Iron deficiency 01/07/2023   Hyperkalemia 05/15/2022   History of rectal polypectomy 06/06/2020   Serrated adenoma of colon 06/06/2020   Hx of adenomatous colonic polyps 06/06/2020   Myocardial infarction [facility])    Arthralgia of both hands 06/26/2019   CAD (coronary artery disease) 02/10/2019  HTN (hypertension) 02/10/2019   Hypothyroidism (acquired) 02/10/2019   Hyperlipidemia, mixed 02/10/2019   Osteopenia 02/10/2019   RLS (restless legs syndrome) 02/10/2019   Chronic pain-pelvic floor dysfunction 02/10/2019    PCP: Geofm Hastings MD  REFERRING PROVIDER: Jerri Moody MD  REFERRING DIAG: [phone] s/p total replacement left hip; M79.672 pain in left foot  THERAPY DIAG:  weakness Pain in  left hip  Difficulty in walking, not elsewhere classified  Cramp and spasm  Pain in left foot  Muscle weakness (generalized)  Rationale for Evaluation and Treatment: Rehabilitation  ONSET DATE: 08/18/24  SUBJECTIVE:   SUBJECTIVE STATEMENT: I had my DEXA scan and I'm right on the edge of osteoporosis.  Left THR anterior approach Aug 18, 2024 No assistive device.    PERTINENT HISTORY: Restless legs left > right long duration (going to see a specialist in July) Cardiac stent 10 years ago Has seen [facility] for pelvic floor PT helped a great deal PAIN:   Are you having pain? Yes NPRS scale: 3/10 pelvic pain Pain location: anterior pelvis and incision Pain orientation: Left  PAIN TYPE: aching Pain description: intermittent  Aggravating factors: too much activity Relieving factors: rest   PRECAUTIONS: None, no swimming for 3 months   WEIGHT BEARING RESTRICTIONS: No  FALLS:  Has patient fallen in last 6 months? 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   PATIENT SURVEYS:  LEFS  Extreme difficulty/unable (0), Quite a bit of difficulty (1), Moderate difficulty (2), Little difficulty (3), No difficulty (4) Survey date:  12/2  Any of your usual work, housework or school activities 0  2. Usual hobbies, recreational or sporting activities 0  3. Getting into/out of the bath 0  4. Walking between rooms 4  5. Putting on socks/shoes 3  6. Squatting  0  7. Lifting an object, like a bag of groceries from the floor 3  8. Performing light  activities around your home 3  9. Performing heavy activities around your home 0  10. Getting into/out of a car 2  11. Walking 2 blocks 1  12. Walking 1 mile 0  13. Going up/down 10 stairs (1 flight) 0  14. Standing for 1 hour 0  15.  sitting for 1 hour 0  16. Running on even ground 0  17. Running on uneven ground 0  18. Making sharp turns while running fast 0  19. Hopping  0  20. Rolling over in bed 1  Score total:  17/80     COGNITION: Overall cognitive status: Within functional limits for tasks assessed      EDEMA:  Mild edema lateral to incision. Large bandaid covering middle of incision but top of bottom of incision appear to be healing well.  No redness   LOWER EXTREMITY ROM: left hip flexion 90 degrees, extension 5 degrees   LOWER EXTREMITY MMT:   MMT Right eval Left eval  Hip flexion 5 4-  Hip extension 5 4-  Hip abduction 5 4-  Hip adduction    Hip internal rotation    Hip external  rotation    Knee flexion 5 4  Knee extension 5 4  Ankle dorsiflexion    Ankle plantarflexion    Ankle inversion    Ankle eversion     (Blank rows = not tested)  LOWER EXTREMITY SPECIAL TESTS:  15 sec SLS on left  FUNCTIONAL TESTS:  5x STS 9.16 sec no hands assist TUG 8.17 3 MWT 600 feet no device Up and down 4 steps reciprocally with 2 railings light support  GAIT: Comments: with fatigue after 1.5 minutes of walking decreased stance time noted on left and mild pelvic drop                                                                                                                                TREATMENT DATE:  09/25/24 NuStep L6 x 7' PT present to discuss status (add more resistance next time) LAQ 4lb 2x5x5  Bridge x10, then add knee ext alt Rt/Lt x 10 in static bridge Lt SLR 4lb x10 Lt hip abd in Rt SL x15 (no weight) Prone hip extension on elbows with TA indraw x20 each LE Sit to stand with red loop band at knees x10 - use mirror to watch knee  alignment Sidestepping with red loop 6 steps each way x 5 laps Updated HEP - gave blue tied loop for new aspects HEP  09/19/24 NuStep L6 x 6' PT present to discuss status 2lb ankle weight: Lt hip abd and HS curl 2x12 each Bil 2lb ankle weight: alt march 2x20 LAQ 4lb 10x3, reduced to 3lb x10 Sit to stand x12 Left SLS 2x30 Lt SLS with 3-way toe taps Rt leg Supine bridge x20 Supine static bridge with alt LE marching x10 Discussion of exercise principles for strengthening as well as pairing strength training with protein intake (20g within 30 min of exercise) Benefits of loading for bone density   09/16/24 Nustep L 6 x 6 min with PT present to discuss status Heel raise to mini squat x 5, then single leg x 5 on L. Standing marching x 10 B Standing hip ABD x 15 B cues to go slower Standing HS curl x 15 B Sit to stand x 10, then x 5 Seated marching x 15 B Seated LAQ x 15 Supine heel slides x 15 Supine SAQ x 20 B Supine small range SLR x 15 L SLS on L SLS with 3 way reach (in air) to fatigue intermittent UE support Rockerboard PF/DF Gastroc stretch - reviewed advised to hold longer  09/09/24 Evaluation Discussion of expected course of recovery Discussed gradual increase of walking by small increments  Review of HEP given by home PT: Standing counter ex's: heel raises, seated yellow band clams, hip abduction, hip extension, standing hip circles  PATIENT EDUCATION:  Education details: Educated patient on anatomy and physiology of current symptoms, prognosis, plan of care as well as initial self care strategies to promote recovery Person educated: Patient Education method:  Explanation Education comprehension: verbalized understanding  HOME EXERCISE PROGRAM: Access Code: [email or web address] URL: [email or web address] Date: 09/25/2024 Prepared by: Orvil Kim Oki  Exercises - Supine Heel Slides  - 1 x daily - 7 x weekly - 1 sets - 10 reps - Supine Bridge  - 1 x daily - 7  x weekly - 1 sets - 10 reps - Airline Pilot with Eccentric Lowering  - 1 x daily - 7 x weekly - 2 sets - 10 reps - Seated Long Arc Quad  - 1 x daily - 7 x weekly - 3 sets - 10 reps - 3-5 sec hold - Single Leg Stance  - 1 x daily - 3 x weekly - 1 sets - 10 reps - max  hold - Single Leg 3 Way Reach with Support  - 1 x daily - 3 x weekly - 2 sets - 10 reps - Sit to Stand with Resistance Around Legs  - 1 x daily - 7 x weekly - 2 sets - 10 reps - Standing March with Unilateral Counter Support  - 1 x daily - 7 x weekly - 2 sets - 20 reps - Quadruped on Forearms Hip Extension  - 1 x daily - 7 x weekly - 3 sets - 10 reps - Sidelying Hip Abduction  - 1 x daily - 7 x weekly - 3 sets - 10 reps - Active Straight Leg Raise with Quad Set  - 1 x daily - 7 x weekly - 3 sets - 10 reps - Side Stepping with Resistance at Thighs  - 1 x daily - 7 x weekly - 5 sets - 6 reps  ASSESSMENT:  CLINICAL IMPRESSION: Patient had an incident where she twisted on a planted foot and felt her hip slide slightly out of socket.  Xrays confirmed good alignment at MD this week.  Pt has palpable clunking/sliding of bil hips in sockets with sit to stand but with addition of hip abd loop this significantly reduces. Updated HEP to further load hip and activate stabilizers within functional therex today.   Eval: Patient is a 73  y.o. female who was seen today for physical therapy evaluation and treatment for s/p left anterior approach THR on 11/10.  She also has a referral for left foot pain which was present yesterday but has resolved today.  She is ambulating without an assistive device.  With short distances she is ambulating well although she fatigues quickly and within 1.5 minutes decreased stance time on left and pelvic drop noted.  She lacks the strength in gluteals needed for standing and walking in the community and to be able to work in her yard.  She is limited in performance of household chores and she has not attempted to walk  her 2 dogs.    OBJECTIVE IMPAIRMENTS: decreased activity tolerance, decreased mobility, difficulty walking, decreased ROM, decreased strength, impaired perceived functional ability, and pain.   ACTIVITY LIMITATIONS: carrying, lifting, bending, standing, squatting, bed mobility, hygiene/grooming, and locomotion level  PARTICIPATION LIMITATIONS: meal prep, cleaning, shopping, community activity, and yard work  PERSONAL FACTORS: 1 comorbidity: Restless legs, cardiac history are also affecting patient's functional outcome.   REHAB POTENTIAL: Good  CLINICAL DECISION MAKING: Stable/uncomplicated  EVALUATION COMPLEXITY: Low   GOALS: Goals reviewed with patient? Yes  SHORT TERM GOALS: Target date: 10/07/2024   The patient will demonstrate knowledge of basic self care strategies and exercises to promote healing  Baseline: Goal status: INITIAL  2.  The patient will have improved gait stamina  and speed needed to ambulate 1200 feet in 6 minutes  Baseline:  Goal status: INITIAL  3.  The patient will be able to turn/roll in bed with minimal to no difficulty Baseline:  Goal status: INITIAL  4. The patient will report a 50% improvement in pain levels with functional activities  INITIAL     LONG TERM GOALS: Target date: 11/04/2024    The patient will be independent in a safe self progression of a home exercise program to promote further recovery of function  Baseline:  Goal status: INITIAL  2.  The patient will have improved hip strength to at least 4+/5 needed for standing, walking longer distances and descending stairs at home and in the community  Baseline:  Goal status: INITIAL  3.  The patient will be able to walk her dogs medium distances Baseline:  Goal status: INITIAL  4.  The patient will be able to stoop or squat in order to pick up objects from the ground (needed for eventual return to gardening) Baseline:  Goal status: INITIAL  5.  LEFS improved to   35 /80  indicating improved function with less pain Baseline:  Goal status: INITIAL    PLAN:  PT FREQUENCY: 1-2x/week  PT DURATION: 8 weeks  PLANNED INTERVENTIONS: [id number]- PT Re-evaluation, 97750- Physical Performance Testing, 97110-Therapeutic exercises, 97530- Therapeutic activity, 97112- Neuromuscular re-education, 97535- Self Care, [id number]- Manual therapy, [phone]- Gait training, Patient/Family education, Stair training, Taping, Cryotherapy, and Moist heat  PLAN FOR NEXT SESSION: hip strengthening particular gluteals (add bridges), progress HEP; Nu-Step; proprioceptive ex's particularly with single leg weight bearing  Deshon Hsiao, PT 09/25/2024 5:09 PM   Phone: [phone] Fax: [phone]

## 2024-09-26 ENCOUNTER — Ambulatory Visit

## 2024-09-26 VITALS — Ht 62.0 in | Wt 105.0 lb

## 2024-09-26 DIAGNOSIS — Z0000 Encounter for general adult medical examination without abnormal findings: Secondary | ICD-10-CM

## 2024-09-26 NOTE — Progress Notes (Signed)
 "  Chief Complaint  Patient presents with   Medicare Wellness     Subjective:   Crystal Barnes is a 73 y.o. female who presents for a Medicare Annual Wellness Visit.  Visit info / Clinical Intake: Medicare Wellness Visit Type:: Subsequent Annual Wellness Visit Persons participating in visit and providing information:: patient Medicare Wellness Visit Mode:: Telephone If telephone:: video declined Since this visit was completed virtually, some vitals may be partially provided or unavailable. Missing vitals are due to the limitations of the virtual format.: Documented vitals are patient reported If Telephone or Video please confirm:: I connected with patient using audio/video enable telemedicine. I verified patient identity with two identifiers, discussed telehealth limitations, and patient agreed to proceed. Patient Location:: Home Provider Location:: Office Interpreter Needed?: No Pre-visit prep was completed: no AWV questionnaire completed by patient prior to visit?: no Living arrangements:: (!) lives alone Patient's Overall Health Status Rating: (!) fair Typical amount of pain: none Does pain affect daily life?: no Are you currently prescribed opioids?: no  Dietary Habits and Nutritional Risks How many meals a day?: 3 Eats fruit and vegetables daily?: yes Most meals are obtained by: preparing own meals In the last 2 weeks, have you had any of the following?: none Diabetic:: no  Functional Status Activities of Daily Living (to include ambulation/medication): Independent Ambulation: Independent Medication Administration: Independent Home Management (perform basic housework or laundry): Independent Manage your own finances?: yes Primary transportation is: driving Concerns about vision?: no *vision screening is required for WTM* Concerns about hearing?: no  Fall Screening Falls in the past year?: 0 Number of falls in past year: 0 Was there an injury with Fall?: 0 Fall  Risk Category Calculator: 0 Patient Fall Risk Level: Low Fall Risk  Fall Risk Patient at Risk for Falls Due to: No Fall Risks Fall risk Follow up: Falls evaluation completed; Falls prevention discussed  Home and Transportation Safety: All rugs have non-skid backing?: N/A, no rugs All stairs or steps have railings?: yes (outside) Grab bars in the bathtub or shower?: (!) no Have non-skid surface in bathtub or shower?: yes Good home lighting?: yes Regular seat belt use?: yes Hospital stays in the last year:: (!) yes How many hospital stays:: 1 Reason: left hip replacement  Cognitive Assessment Difficulty concentrating, remembering, or making decisions? : no Will 6CIT or Mini Cog be Completed: yes Give patient an address phrase to remember (5 components): [address] [place], [place] [place] (For Healthcare) Does Patient Have a Medical Advance Directive?: Yes Does patient want to make changes to medical advance directive?: Yes (Inpatient - patient requests chaplain consult to change a medical advance directive) Type of Advance Directive: Healthcare Power of [place]; Living will Copy of Healthcare Power of Attorney in Chart?: No - copy requested Copy of Living Will in Chart?: No - copy requested  Reviewed/Updated  Reviewed/Updated: Reviewed All (Medical, Surgical, Family, Medications, Allergies, Care Teams, Patient Goals)    Allergies (verified) Benadryl  [diphenhydramine ] and Cymbalta  [duloxetine  hcl]   Current Medications (verified) Outpatient Encounter Medications as of 09/26/2024  Medication Sig   amoxicillin  (AMOXIL ) 500 MG capsule Take 4 pills one hour prior to dental work   aspirin  (ASPIRIN  81) 81 MG chewable tablet Chew 1 tablet (81 mg total) by mouth 2 (two) times daily. To be taken after surgery to prevent blood clots   celecoxib  (CELEBREX ) 200 MG capsule Take 1 capsule (200 mg total) by mouth 2 (two) times daily.   clonazePAM  (KLONOPIN ) 0.5 MG  tablet Take  1-2 tablets (0.5-1 mg total) by mouth at bedtime.   doxycycline  (VIBRA -TABS) 100 MG tablet Take 1 tablet (100 mg total) by mouth 2 (two) times daily.   Evolocumab  (REPATHA  SURECLICK) 140 MG/ML SOAJ Inject 140 mg into the skin every 14 (fourteen) days.   ferrous sulfate 325 (65 FE) MG EC tablet Take 325 mg by mouth daily.   gabapentin  (NEURONTIN ) 300 MG capsule TAKE 1 CAPSULE BY MOUTH 3 TIMES A DAY   hydrochlorothiazide  (HYDRODIURIL ) 25 MG tablet TAKE 1 TABLET BY MOUTH DAILY   levothyroxine  (SYNTHROID ) 88 MCG tablet TAKE 1 TABLET BY MOUTH DAILY BEFORE BREAKFAST   MAGNESIUM  PO Take 500 mg by mouth daily with supper.   methocarbamol  (ROBAXIN ) 500 MG tablet Take 1 tablet (500 mg total) by mouth 2 (two) times daily as needed.   Multiple Minerals-Vitamins (CALCIUM  & VIT D3 BONE HEALTH PO) Take 1,200 mg by mouth daily.   Multiple Vitamin (MULTIVITAMIN WITH MINERALS) TABS tablet Take 1 tablet by mouth daily.   nitroGLYCERIN  (NITROSTAT ) 0.4 MG SL tablet Place 1 tablet (0.4 mg total) under the tongue every 5 (five) minutes as needed for chest pain.   ondansetron  (ZOFRAN ) 4 MG tablet Take 1 tablet (4 mg total) by mouth every 8 (eight) hours as needed for nausea or vomiting.   Polyethyl Glycol-Propyl Glycol (SYSTANE OP) Place 1 drop into both eyes daily as needed (Dry eyes).    PSYLLIUM PO Take 15 mLs by mouth at bedtime.   rOPINIRole  (REQUIP ) 1 MG tablet TAKE 3 TO 4 TABLETS BY MOUTH AT BEDTIME   rosuvastatin  (CRESTOR ) 5 MG tablet TAKE 1 TABLET BY MOUTH DAILY   traMADol  (ULTRAM ) 50 MG tablet Take 1-2 tablets (50-100 mg total) by mouth every 8 (eight) hours as needed (chronic pelvic pain).   No facility-administered encounter medications on file as of 09/26/2024.    History: Past Medical History:  Diagnosis Date   Arthritis    CAD (coronary artery disease)    Cataract    bilateral-removed   Hyperlipidemia    Hypertension    Hypothyroidism    Myocardial infarction (HCC) 06/01/2013   NSTEMI, s/p  DES mRCA, 60% mLAD treated medically   Restless leg syndrome    Thyroid  disease    Past Surgical History:  Procedure Laterality Date   ABDOMINAL HYSTERECTOMY  2008   CATARACT EXTRACTION Bilateral    COLONOSCOPY     CORONARY STENT PLACEMENT  2014   ENDOSCOPIC MUCOSAL RESECTION N/A 11/24/2019   Procedure: ENDOSCOPIC MUCOSAL RESECTION;  Surgeon: Wilhelmenia Aloha Raddle., MD;  Location: [facility] ENDOSCOPY;  Service: Gastroenterology;  Laterality: N/A;   ENDOSCOPIC MUCOSAL RESECTION  08/04/2020   Procedure: ENDOSCOPIC MUCOSAL RESECTION;  Surgeon: Wilhelmenia Aloha Raddle., MD;  Location: [name] ENDOSCOPY;  Service: Gastroenterology;;   ENID SIGMOIDOSCOPY N/A 11/24/2019   Procedure: ENID MORIN;  Surgeon: Wilhelmenia Aloha Raddle., MD;  Location: [facility] ENDOSCOPY;  Service: Gastroenterology;  Laterality: N/A;   FLEXIBLE SIGMOIDOSCOPY N/A 08/04/2020   Procedure: FLEXIBLE SIGMOIDOSCOPY;  Surgeon: Wilhelmenia Aloha Raddle., MD;  Location: [name] ENDOSCOPY;  Service: Gastroenterology;  Laterality: N/A;   HEMOSTASIS CLIP PLACEMENT  11/24/2019   Procedure: HEMOSTASIS CLIP PLACEMENT;  Surgeon: Wilhelmenia Aloha Raddle., MD;  Location: [facility] ENDOSCOPY;  Service: Gastroenterology;;   HEMOSTASIS CLIP PLACEMENT  08/04/2020   Procedure: HEMOSTASIS CLIP PLACEMENT;  Surgeon: Wilhelmenia Aloha Raddle., MD;  Location: [name] ENDOSCOPY;  Service: Gastroenterology;;   LASIK Bilateral    POLYPECTOMY     SUBMUCOSAL LIFTING INJECTION  11/24/2019  Procedure: SUBMUCOSAL LIFTING INJECTION;  Surgeon: Wilhelmenia Aloha Raddle., MD;  Location: [facility] ENDOSCOPY;  Service: Gastroenterology;;   SUBMUCOSAL LIFTING INJECTION  08/04/2020   Procedure: SUBMUCOSAL LIFTING INJECTION;  Surgeon: Wilhelmenia Aloha Raddle., MD;  Location: [name] ENDOSCOPY;  Service: Gastroenterology;;   TONSILLECTOMY     TOTAL HIP ARTHROPLASTY Left 08/18/2024   Procedure: ARTHROPLASTY, HIP, TOTAL, ANTERIOR APPROACH;  Surgeon: Jerri Kay HERO, MD;  Location: [facility] OR;  Service:  Orthopedics;  Laterality: Left;  3-C   Family History  Problem Relation Age of Onset   Colon polyps Mother    Esophageal cancer Father    Colon cancer Neg Hx    Rectal cancer Neg Hx    Stomach cancer Neg Hx    Inflammatory bowel disease Neg Hx    Liver disease Neg Hx    Pancreatic cancer Neg Hx    Social History   Occupational History   Occupation: Retired     Comment: about a year and half ago  Tobacco Use   Smoking status: Former    Current packs/day: 0.00    Types: Cigarettes    Quit date: 10/09/1992    Years since quitting: 31.9   Smokeless tobacco: Never  Vaping Use   Vaping status: Never Used  Substance and Sexual Activity   Alcohol use: Not Currently   Drug use: Never   Sexual activity: Not Currently    Birth control/protection: Post-menopausal   Tobacco Counseling Counseling given: Yes  SDOH Screenings   Food Insecurity: No Food Insecurity (09/26/2024)  Housing: Unknown (09/26/2024)  Transportation Needs: No Transportation Needs (09/26/2024)  Utilities: Not At Risk (09/26/2024)  Alcohol Screen: Low Risk (08/19/2024)  Depression (PHQ2-9): Low Risk (09/26/2024)  Financial Resource Strain: Low Risk (08/19/2024)  Physical Activity: Sufficiently Active (09/26/2024)  Recent Concern: Physical Activity - Inactive (08/19/2024)  Social Connections: Socially Isolated (09/26/2024)  Stress: No Stress Concern Present (09/26/2024)  Tobacco Use: Medium Risk (09/26/2024)  Health Literacy: Adequate Health Literacy (09/26/2024)   See flowsheets for full screening details  Depression Screen PHQ 2 & 9 Depression Scale- Over the past 2 weeks, how often have you been bothered by any of the following problems? Little interest or pleasure in doing things: 0 Feeling down, depressed, or hopeless (PHQ Adolescent also includes...irritable): 0 PHQ-2 Total Score: 0 Trouble falling or staying asleep, or sleeping too much: 1 (intermittent sleeping - gets 8hrs total) Feeling tired or  having little energy: 0 Poor appetite or overeating (PHQ Adolescent also includes...weight loss): 0 Feeling bad about yourself - or that you are a failure or have let yourself or your family down: 0 Trouble concentrating on things, such as reading the newspaper or watching television (PHQ Adolescent also includes...like school work): 0 Moving or speaking so slowly that other people could have noticed. Or the opposite - being so fidgety or restless that you have been moving around a lot more than usual: 3 (restless legs) Thoughts that you would be better off dead, or of hurting yourself in some way: 0 PHQ-9 Total Score: 4 If you checked off any problems, how difficult have these problems made it for you to do your work, take care of things at home, or get along with other people?: Not difficult at all  Depression Treatment Depression Interventions/Treatment : EYV7-0 Score <4 Follow-up Not Indicated; Medication; Currently on Treatment     Goals Addressed               This Visit's Progress     Patient  Stated (pt-stated)        Patient stated she plans to continue walking/exercising             Objective:    Today's Vitals   09/26/24 0944  Weight: 105 lb (47.6 kg)  Height: 5' 2 (1.575 m)   Body mass index is 19.2 kg/m.  Hearing/Vision screen Hearing Screening - Comments:: Denies hearing difficulties   Vision Screening - Comments:: Denies vision concerns - plans to get another Ophthalmologist  Immunizations and Health Maintenance Health Maintenance  Topic Date Due   Influenza Vaccine  05/09/2024   COVID-19 Vaccine (5 - 2025-26 season) 06/09/2024   Medicare Annual Wellness (AWV)  09/26/2025   Mammogram  02/06/2026   Bone Density Scan  09/24/2026   Colonoscopy  11/09/2026   DTaP/Tdap/Td (2 - Td or Tdap) 07/28/2030   Pneumococcal Vaccine: 50+ Years  Completed   Hepatitis C Screening  Completed   Meningococcal B Vaccine  Aged Out   Zoster Vaccines- Shingrix   Discontinued        Assessment/Plan:  This is a routine wellness examination for Crystal Barnes.  Patient Care Team: Geofm Glade PARAS, MD as PCP - General (Internal Medicine) Francyne Headland, MD as PCP - Cardiology (Cardiology) Camillo Golas, MD as Consulting Physician (Ophthalmology) [facility], Jon Garre, [place] as Physician Assistant (Cardiology)  I have personally reviewed and noted the following in the patients chart:   Medical and social history Use of alcohol, tobacco or illicit drugs  Current medications and supplements including opioid prescriptions. Functional ability and status Nutritional status Physical activity Advanced directives List of other physicians Hospitalizations, surgeries, and ER visits in previous 12 months Vitals Screenings to include cognitive, depression, and falls Referrals and appointments  No orders of the defined types were placed in this encounter.  In addition, I have reviewed and discussed with patient certain preventive protocols, quality metrics, and best practice recommendations. A written personalized care plan for preventive services as well as general preventive health recommendations were provided to patient.   Crystal Barnes, CMA   09/26/2024   Return in 1 year (on 09/26/2025).  After Visit Summary: (MyChart) Due to this being a telephonic visit, the after visit summary with patients personalized plan was offered to patient via MyChart   Nurse Notes: scheduled 6-mth f/u appt w/PCP.   "

## 2024-09-26 NOTE — Patient Instructions (Addendum)
 Crystal Barnes,  Thank you for taking the time for your Medicare Wellness Visit. I appreciate your continued commitment to your health goals. Please review the care plan we discussed, and feel free to reach out if I can assist you further.  Please note that Annual Wellness Visits do not include a physical exam. Some assessments may be limited, especially if the visit was conducted virtually. If needed, we may recommend an in-person follow-up with your provider.  Ongoing Care Seeing your primary care provider every 3 to 6 months helps us  monitor your health and provide consistent, personalized care.   Referrals If a referral was made during today's visit and you haven't received any updates within two weeks, please contact the referred provider directly to check on the status.  Recommended Screenings:  Health Maintenance  Topic Date Due   Flu Shot  05/09/2024   COVID-19 Vaccine (5 - 2025-26 season) 06/09/2024   Medicare Annual Wellness Visit  09/26/2025   Breast Cancer Screening  02/06/2026   Osteoporosis screening with Bone Density Scan  09/24/2026   Colon Cancer Screening  11/09/2026   DTaP/Tdap/Td vaccine (2 - Td or Tdap) 07/28/2030   Pneumococcal Vaccine for age over 49  Completed   Hepatitis C Screening  Completed   Meningitis B Vaccine  Aged Out   Zoster (Shingles) Vaccine  Discontinued       09/26/2024    9:45 AM  Advanced Directives  Does Patient Have a Medical Advance Directive? Yes  Type of Estate Agent of [place];Living will  Does patient want to make changes to medical advance directive? Yes (Inpatient - patient requests chaplain consult to change a medical advance directive)  Copy of Healthcare Power of Attorney in Chart? No - copy requested    Vision: Annual vision screenings are recommended for early detection of glaucoma, cataracts, and diabetic retinopathy. These exams can also reveal signs of chronic conditions such as diabetes and high blood  pressure.  Dental: Annual dental screenings help detect early signs of oral cancer, gum disease, and other conditions linked to overall health, including heart disease and diabetes.

## 2024-09-29 ENCOUNTER — Other Ambulatory Visit (HOSPITAL_COMMUNITY): Payer: Self-pay

## 2024-09-29 MED FILL — Clonazepam Tab 0.5 MG: ORAL | 30 days supply | Qty: 60 | Fill #1 | Status: AC

## 2024-10-06 ENCOUNTER — Ambulatory Visit (INDEPENDENT_AMBULATORY_CARE_PROVIDER_SITE_OTHER): Admitting: Physician Assistant

## 2024-10-06 ENCOUNTER — Encounter: Payer: Self-pay | Admitting: Physician Assistant

## 2024-10-06 DIAGNOSIS — M810 Age-related osteoporosis without current pathological fracture: Secondary | ICD-10-CM | POA: Diagnosis not present

## 2024-10-06 MED ORDER — ROMOSOZUMAB-AQQG 105 MG/1.17ML ~~LOC~~ SOSY: 210.0000 mg | PREFILLED_SYRINGE | Freq: Once | SUBCUTANEOUS | Status: AC

## 2024-10-06 NOTE — Progress Notes (Signed)
 "  Office Visit Note   Patient: Crystal Barnes           Date of Birth: Jul 30, 1951           MRN: [id number] Visit Date: 10/06/2024              Requested by: Jerri Kay HERO, MD [address] [place],  [place] [address] PCP: Geofm Glade PARAS, MD   Assessment & Plan: Visit Diagnoses:  1. Age-related osteoporosis without current pathological fracture     Plan: Patient is a pleasant 73 year old woman who is referred for evaluation of osteoporosis.  She recently had hip replacement surgery and was noted to have less than optimal bone quality.  She also had a fracture in the hip when they went forward to do the surgery.  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  She has never taken medication for osteoporosis before.  She has a history of a stent placed over 10 years ago has never had problems with her heart or heart attack.  She is on Repatha  and has significantly lowered her cholesterol.  She has no history of cancer or kidney disease no history of ulcers or  gastric bypass surgery.  No history of epilepsy or seizures.  She did undergo a hysterectomy when she was 52 did not take any hormone replacement.  She takes 1200 mg of calcium  a day and tries to keep but diet rich in dairy products she is less sure of her vitamin D  intake.  She was a former smoker for 30 years averaging a pack a day.  She does not drink.  She does walk daily.  She has had some dental work done but nothing currently she has no familiar history of fragility fracture.  She recently had a bone density scan.  She has a FRAX score concerning for almost a 6% increase in risk of fragility fracture in her right hip.  I spent 45 minutes reviewing her labs and talking with her in person and reviewing her chart.  Given her history I think she would be a good candidate for Evenity .  I do think at this point she does need to build bone if possible.  We reviewed the side effects of given her information about the medication.  She would like  to go forward.  In the meantime we also discussed checking her calcium  and we will draw vitamin D  today to see where she is to see if she is vitamin D  deficient  Follow-Up Instructions: No follow-ups on file.   Orders:  No orders of the defined types were placed in this encounter.  No orders of the defined types were placed in this encounter.     Procedures: No procedures performed   Clinical Data: No additional findings.   Subjective: Chief Complaint  Patient presents with   osteoporosis management    HPI patient is a pleasant 73 year old woman referred by Dr. Jerri for evaluation of osteoporosis  Review of Systems  All other systems reviewed and are negative.    Objective: Vital Signs: LMP  (LMP Unknown)   Physical Exam Constitutional:      Appearance: Normal appearance.  Pulmonary:     Effort: Pulmonary effort is normal.  Neurological:     General: No focal deficit present.     Mental Status: She is alert and oriented to person, place, and time.  Psychiatric:        Mood and Affect: Mood normal.  Behavior: Behavior normal.       Specialty Comments:  No specialty comments available.  Imaging: No results found.   PMFS History: Patient Active Problem List   Diagnosis Date Noted   Age-related osteoporosis without current pathological fracture 10/06/2024   Paronychia of right little finger 08/21/2024   Status post total replacement of left hip 08/18/2024   Stage 2 skin ulcer of sacral region (HCC) 08/14/2024   Chronic hip pain, left 08/14/2024   Preop examination 06/03/2024   Primary osteoarthritis of left hip 05/13/2024   Mid back pain on right side 11/29/2023   Iron deficiency 01/07/2023   Hyperkalemia 05/15/2022   History of rectal polypectomy 06/06/2020   Serrated adenoma of colon 06/06/2020   Hx of adenomatous colonic polyps 06/06/2020   Myocardial infarction (HCC)    Arthralgia of both hands 06/26/2019   CAD (coronary artery disease)  02/10/2019   HTN (hypertension) 02/10/2019   Hypothyroidism (acquired) 02/10/2019   Hyperlipidemia, mixed 02/10/2019   Osteopenia 02/10/2019   RLS (restless legs syndrome) 02/10/2019   Chronic pain-pelvic floor dysfunction 02/10/2019   Past Medical History:  Diagnosis Date   Arthritis    CAD (coronary artery disease)    Cataract    bilateral-removed   Hyperlipidemia    Hypertension    Hypothyroidism    Myocardial infarction (HCC) 06/01/2013   NSTEMI, s/p DES mRCA, 60% mLAD treated medically   Restless leg syndrome    Thyroid  disease     Family History  Problem Relation Age of Onset   Colon polyps Mother    Esophageal cancer Father    Colon cancer Neg Hx    Rectal cancer Neg Hx    Stomach cancer Neg Hx    Inflammatory bowel disease Neg Hx    Liver disease Neg Hx    Pancreatic cancer Neg Hx     Past Surgical History:  Procedure Laterality Date   ABDOMINAL HYSTERECTOMY  2008   CATARACT EXTRACTION Bilateral    COLONOSCOPY     CORONARY STENT PLACEMENT  2014   ENDOSCOPIC MUCOSAL RESECTION N/A 11/24/2019   Procedure: ENDOSCOPIC MUCOSAL RESECTION;  Surgeon: Wilhelmenia Aloha Raddle., MD;  Location: [facility] ENDOSCOPY;  Service: Gastroenterology;  Laterality: N/A;   ENDOSCOPIC MUCOSAL RESECTION  08/04/2020   Procedure: ENDOSCOPIC MUCOSAL RESECTION;  Surgeon: Wilhelmenia Aloha Raddle., MD;  Location: [name]COPY;  Service: Gastroenterology;;   ENID SIGMOIDOSCOPY N/A 11/24/2019   Procedure: ENID MORIN;  Surgeon: Wilhelmenia Aloha Raddle., MD;  Location: [facility] ENDOSCOPY;  Service: Gastroenterology;  Laterality: N/A;   FLEXIBLE SIGMOIDOSCOPY N/A 08/04/2020   Procedure: FLEXIBLE SIGMOIDOSCOPY;  Surgeon: Wilhelmenia Aloha Raddle., MD;  Location: [name] ENDOSCOPY;  Service: Gastroenterology;  Laterality: N/A;   HEMOSTASIS CLIP PLACEMENT  11/24/2019   Procedure: HEMOSTASIS CLIP PLACEMENT;  Surgeon: Wilhelmenia Aloha Raddle., MD;  Location: [facility] ENDOSCOPY;  Service: Gastroenterology;;    HEMOSTASIS CLIP PLACEMENT  08/04/2020   Procedure: HEMOSTASIS CLIP PLACEMENT;  Surgeon: Wilhelmenia Aloha Raddle., MD;  Location: [name] ENDOSCOPY;  Service: Gastroenterology;;   LASIK Bilateral    POLYPECTOMY     SUBMUCOSAL LIFTING INJECTION  11/24/2019   Procedure: SUBMUCOSAL LIFTING INJECTION;  Surgeon: Wilhelmenia Aloha Raddle., MD;  Location: [facility] ENDOSCOPY;  Service: Gastroenterology;;   SUBMUCOSAL LIFTING INJECTION  08/04/2020   Procedure: SUBMUCOSAL LIFTING INJECTION;  Surgeon: Wilhelmenia Aloha Raddle., MD;  Location: [name]COPY;  Service: Gastroenterology;;   TONSILLECTOMY     TOTAL HIP ARTHROPLASTY Left 08/18/2024   Procedure: ARTHROPLASTY, HIP, TOTAL, ANTERIOR APPROACH;  Surgeon: Jerri Kay HERO,  MD;  Location: [facility] OR;  Service: Orthopedics;  Laterality: Left;  3-C   Social History   Occupational History   Occupation: Retired     Comment: about a year and half ago  Tobacco Use   Smoking status: Former    Current packs/day: 0.00    Types: Cigarettes    Quit date: 10/09/1992    Years since quitting: 32.0   Smokeless tobacco: Never  Vaping Use   Vaping status: Never Used  Substance and Sexual Activity   Alcohol use: Not Currently   Drug use: Never   Sexual activity: Not Currently    Birth control/protection: Post-menopausal        "

## 2024-10-07 ENCOUNTER — Other Ambulatory Visit: Payer: Self-pay | Admitting: Internal Medicine

## 2024-10-07 ENCOUNTER — Ambulatory Visit

## 2024-10-07 LAB — VITAMIN D 25 HYDROXY (VIT D DEFICIENCY, FRACTURES): Vit D, 25-Hydroxy: 86 ng/mL (ref 30–100)

## 2024-10-07 NOTE — Telephone Encounter (Signed)
 Copied from CRM [id number]. Topic: Clinical - Medication Refill >> Oct 07, 2024  7:48 AM Brittany M wrote: Medication: traMADol  (ULTRAM ) 50 MG tablet  Has the patient contacted their pharmacy? Yes (Agent: If no, request that the patient contact the pharmacy for the refill. If patient does not wish to contact the pharmacy document the reason why and proceed with request.) (Agent: If yes, when and what did the pharmacy advise?)  This is the patient's preferred pharmacy:  [facility] [id number] [place], [place] - [address] [place] [address] Phone: [phone] Fax: [phone]  Is this the correct pharmacy for this prescription? Yes If no, delete pharmacy and type the correct one.   Has the prescription been filled recently? Yes  Is the patient out of the medication? Yes  Has the patient been seen for an appointment in the last year OR does the patient have an upcoming appointment? Yes  Can we respond through MyChart? Yes  Agent: Please be advised that Rx refills may take up to 3 business days. We ask that you follow-up with your pharmacy.

## 2024-10-10 ENCOUNTER — Other Ambulatory Visit: Payer: Self-pay

## 2024-10-10 ENCOUNTER — Emergency Department (HOSPITAL_COMMUNITY)

## 2024-10-10 ENCOUNTER — Observation Stay (HOSPITAL_COMMUNITY)
Admission: EM | Admit: 2024-10-10 | Discharge: 2024-10-11 | Disposition: A | Discharge status: Home / Self Care | Source: Ambulatory Visit

## 2024-10-10 ENCOUNTER — Other Ambulatory Visit: Payer: Self-pay | Admitting: Family

## 2024-10-10 ENCOUNTER — Emergency Department (HOSPITAL_COMMUNITY): Admitting: Anesthesiology

## 2024-10-10 ENCOUNTER — Encounter (HOSPITAL_COMMUNITY): Payer: Self-pay | Admitting: Orthopaedic Surgery

## 2024-10-10 ENCOUNTER — Ambulatory Visit

## 2024-10-10 ENCOUNTER — Observation Stay (HOSPITAL_COMMUNITY)

## 2024-10-10 ENCOUNTER — Encounter (HOSPITAL_COMMUNITY): Admission: EM | Disposition: A | Discharge status: Home / Self Care | Payer: Self-pay | Source: Ambulatory Visit

## 2024-10-10 ENCOUNTER — Encounter: Payer: Self-pay | Admitting: Internal Medicine

## 2024-10-10 DIAGNOSIS — I2510 Atherosclerotic heart disease of native coronary artery without angina pectoris: Secondary | ICD-10-CM | POA: Diagnosis not present

## 2024-10-10 DIAGNOSIS — T84021A Dislocation of internal left hip prosthesis, initial encounter: Secondary | ICD-10-CM | POA: Diagnosis not present

## 2024-10-10 DIAGNOSIS — T84028A Dislocation of other internal joint prosthesis, initial encounter: Secondary | ICD-10-CM

## 2024-10-10 DIAGNOSIS — Z87891 Personal history of nicotine dependence: Secondary | ICD-10-CM | POA: Diagnosis not present

## 2024-10-10 DIAGNOSIS — Z96649 Presence of unspecified artificial hip joint: Secondary | ICD-10-CM | POA: Diagnosis not present

## 2024-10-10 DIAGNOSIS — I10 Essential (primary) hypertension: Secondary | ICD-10-CM

## 2024-10-10 DIAGNOSIS — E039 Hypothyroidism, unspecified: Secondary | ICD-10-CM | POA: Insufficient documentation

## 2024-10-10 DIAGNOSIS — S73005A Unspecified dislocation of left hip, initial encounter: Principal | ICD-10-CM

## 2024-10-10 DIAGNOSIS — Z79899 Other long term (current) drug therapy: Secondary | ICD-10-CM | POA: Insufficient documentation

## 2024-10-10 DIAGNOSIS — Z7982 Long term (current) use of aspirin: Secondary | ICD-10-CM | POA: Diagnosis not present

## 2024-10-10 DIAGNOSIS — M25552 Pain in left hip: Secondary | ICD-10-CM | POA: Diagnosis present

## 2024-10-10 DIAGNOSIS — Y9389 Activity, other specified: Secondary | ICD-10-CM | POA: Diagnosis not present

## 2024-10-10 HISTORY — PX: HIP CLOSED REDUCTION: SHX983

## 2024-10-10 MED ORDER — TRAMADOL HCL 50 MG PO TABS: 50.0000 mg | ORAL_TABLET | Freq: Three times a day (TID) | ORAL | 0 refills | Status: DC | PRN

## 2024-10-10 MED ORDER — DOCUSATE SODIUM 100 MG PO CAPS
100.0000 mg | ORAL_CAPSULE | Freq: Two times a day (BID) | ORAL | Status: DC
  Administered 2024-10-10 – 2024-10-11 (×2): 100 mg via ORAL
  Filled 2024-10-10 (×2): qty 1

## 2024-10-10 MED ORDER — ROSUVASTATIN CALCIUM 5 MG PO TABS
5.0000 mg | ORAL_TABLET | Freq: Every day | ORAL | Status: DC
  Administered 2024-10-10: 5 mg via ORAL
  Filled 2024-10-10 (×2): qty 1

## 2024-10-10 MED ORDER — ASPIRIN 81 MG PO CHEW
81.0000 mg | CHEWABLE_TABLET | Freq: Two times a day (BID) | ORAL | Status: DC
  Administered 2024-10-10 – 2024-10-11 (×2): 81 mg via ORAL
  Filled 2024-10-10 (×2): qty 1

## 2024-10-10 MED ORDER — LIDOCAINE HCL (PF) 2 % IJ SOLN
INTRAMUSCULAR | Status: AC
  Filled 2024-10-10: qty 5

## 2024-10-10 MED ORDER — FENTANYL CITRATE (PF) 100 MCG/2ML IJ SOLN
INTRAMUSCULAR | Status: AC
  Filled 2024-10-10: qty 2

## 2024-10-10 MED ORDER — OXYCODONE HCL 5 MG PO TABS: 5.0000 mg | ORAL_TABLET | Freq: Once | ORAL | Status: DC | PRN

## 2024-10-10 MED ORDER — ORAL CARE MOUTH RINSE: 15.0000 mL | Freq: Once | OROMUCOSAL | Status: DC

## 2024-10-10 MED ORDER — GABAPENTIN 100 MG PO CAPS
300.0000 mg | ORAL_CAPSULE | Freq: Three times a day (TID) | ORAL | Status: DC
  Administered 2024-10-10 – 2024-10-11 (×2): 300 mg via ORAL
  Filled 2024-10-10 (×2): qty 3

## 2024-10-10 MED ORDER — SUCCINYLCHOLINE CHLORIDE 200 MG/10ML IV SOSY
PREFILLED_SYRINGE | INTRAVENOUS | Status: DC | PRN
  Administered 2024-10-10: 100 mg via INTRAVENOUS

## 2024-10-10 MED ORDER — ONDANSETRON HCL 4 MG PO TABS: 4.0000 mg | ORAL_TABLET | Freq: Four times a day (QID) | ORAL | Status: DC | PRN

## 2024-10-10 MED ORDER — PROPOFOL 10 MG/ML IV BOLUS
INTRAVENOUS | Status: AC | PRN
  Administered 2024-10-10: 50 mg via INTRAVENOUS

## 2024-10-10 MED ORDER — ONDANSETRON HCL 4 MG/2ML IJ SOLN: 4.0000 mg | Freq: Once | INTRAMUSCULAR | Status: DC | PRN

## 2024-10-10 MED ORDER — PROPOFOL 500 MG/50ML IV EMUL
INTRAVENOUS | Status: AC
  Filled 2024-10-10: qty 50

## 2024-10-10 MED ORDER — TRAMADOL HCL 50 MG PO TABS
50.0000 mg | ORAL_TABLET | Freq: Three times a day (TID) | ORAL | Status: DC | PRN
  Administered 2024-10-10: 50 mg via ORAL
  Filled 2024-10-10: qty 1

## 2024-10-10 MED ORDER — PHENYLEPHRINE 80 MCG/ML (10ML) SYRINGE FOR IV PUSH (FOR BLOOD PRESSURE SUPPORT)
PREFILLED_SYRINGE | INTRAVENOUS | Status: DC | PRN
  Administered 2024-10-10: 120 ug via INTRAVENOUS

## 2024-10-10 MED ORDER — LACTATED RINGERS IV SOLN: INTRAVENOUS | Status: DC

## 2024-10-10 MED ORDER — ONDANSETRON HCL 4 MG/2ML IJ SOLN: 4.0000 mg | Freq: Four times a day (QID) | INTRAMUSCULAR | Status: DC | PRN

## 2024-10-10 MED ORDER — FERROUS SULFATE 325 (65 FE) MG PO TABS
325.0000 mg | ORAL_TABLET | Freq: Every day | ORAL | Status: DC
  Administered 2024-10-11: 325 mg via ORAL
  Filled 2024-10-10: qty 1

## 2024-10-10 MED ORDER — METHOCARBAMOL 1000 MG/10ML IJ SOLN: 500.0000 mg | Freq: Four times a day (QID) | INTRAMUSCULAR | Status: DC | PRN

## 2024-10-10 MED ORDER — ACETAMINOPHEN 325 MG PO TABS: 325.0000 mg | ORAL_TABLET | Freq: Four times a day (QID) | ORAL | Status: DC | PRN

## 2024-10-10 MED ORDER — ROPINIROLE HCL 1 MG PO TABS
1.0000 mg | ORAL_TABLET | Freq: Every evening | ORAL | Status: DC | PRN
  Administered 2024-10-10: 2 mg via ORAL
  Administered 2024-10-10: 1 mg via ORAL
  Filled 2024-10-10: qty 2
  Filled 2024-10-10: qty 3

## 2024-10-10 MED ORDER — PROPOFOL 10 MG/ML IV BOLUS
INTRAVENOUS | Status: DC | PRN
  Administered 2024-10-10: 4 mg via INTRAVENOUS
  Administered 2024-10-10: 120 mg via INTRAVENOUS

## 2024-10-10 MED ORDER — FENTANYL CITRATE (PF) 100 MCG/2ML IJ SOLN
INTRAMUSCULAR | Status: DC | PRN
  Administered 2024-10-10: 100 ug via INTRAVENOUS

## 2024-10-10 MED ORDER — FENTANYL CITRATE (PF) 50 MCG/ML IJ SOSY
PREFILLED_SYRINGE | INTRAMUSCULAR | Status: AC
  Filled 2024-10-10: qty 1

## 2024-10-10 MED ORDER — CHLORHEXIDINE GLUCONATE 0.12 % MT SOLN: 15.0000 mL | Freq: Once | OROMUCOSAL | Status: DC

## 2024-10-10 MED ORDER — METOCLOPRAMIDE HCL 5 MG/ML IJ SOLN: 5.0000 mg | Freq: Three times a day (TID) | INTRAMUSCULAR | Status: DC | PRN

## 2024-10-10 MED ORDER — PROPOFOL 10 MG/ML IV BOLUS
1.0000 mg/kg | Freq: Once | INTRAVENOUS | Status: AC
  Administered 2024-10-10: 100 mg via INTRAVENOUS
  Filled 2024-10-10: qty 20

## 2024-10-10 MED ORDER — MORPHINE SULFATE (PF) 2 MG/ML IV SOLN: 0.5000 mg | INTRAVENOUS | Status: DC | PRN

## 2024-10-10 MED ORDER — MORPHINE SULFATE (PF) 2 MG/ML IV SOLN
2.0000 mg | Freq: Once | INTRAVENOUS | Status: AC
  Administered 2024-10-10: 2 mg via INTRAVENOUS
  Filled 2024-10-10: qty 1

## 2024-10-10 MED ORDER — NAPHAZOLINE-GLYCERIN 0.012-0.25 % OP SOLN
1.0000 [drp] | Freq: Four times a day (QID) | OPHTHALMIC | Status: DC | PRN
  Administered 2024-10-10: 2 [drp] via OPHTHALMIC
  Filled 2024-10-10: qty 15

## 2024-10-10 MED ORDER — MORPHINE SULFATE (PF) 2 MG/ML IV SOLN: 2.0000 mg | Freq: Once | INTRAVENOUS | Status: DC

## 2024-10-10 MED ORDER — PROPOFOL 10 MG/ML IV BOLUS
INTRAVENOUS | Status: AC
  Filled 2024-10-10: qty 20

## 2024-10-10 MED ORDER — HYDROCODONE-ACETAMINOPHEN 5-325 MG PO TABS
1.0000 | ORAL_TABLET | ORAL | Status: DC | PRN
  Administered 2024-10-11 (×2): 2 via ORAL
  Filled 2024-10-10 (×2): qty 2

## 2024-10-10 MED ORDER — PROPOFOL 10 MG/ML IV BOLUS
1.0000 mg/kg | Freq: Once | INTRAVENOUS | Status: DC
  Filled 2024-10-10: qty 20

## 2024-10-10 MED ORDER — METOCLOPRAMIDE HCL 5 MG PO TABS: 5.0000 mg | ORAL_TABLET | Freq: Three times a day (TID) | ORAL | Status: DC | PRN

## 2024-10-10 MED ORDER — AMISULPRIDE (ANTIEMETIC) 5 MG/2ML IV SOLN: 10.0000 mg | Freq: Once | INTRAVENOUS | Status: DC | PRN

## 2024-10-10 MED ORDER — SODIUM CHLORIDE 0.9 % IV SOLN: INTRAVENOUS | Status: DC

## 2024-10-10 MED ORDER — LEVOTHYROXINE SODIUM 88 MCG PO TABS
88.0000 ug | ORAL_TABLET | Freq: Every day | ORAL | Status: DC
  Administered 2024-10-11: 88 ug via ORAL
  Filled 2024-10-10: qty 1

## 2024-10-10 MED ORDER — FENTANYL CITRATE (PF) 50 MCG/ML IJ SOSY
25.0000 ug | PREFILLED_SYRINGE | INTRAMUSCULAR | Status: DC | PRN
  Administered 2024-10-10: 50 ug via INTRAVENOUS
  Administered 2024-10-10 (×2): 25 ug via INTRAVENOUS

## 2024-10-10 MED ORDER — METHOCARBAMOL 500 MG PO TABS
500.0000 mg | ORAL_TABLET | Freq: Four times a day (QID) | ORAL | Status: DC | PRN
  Administered 2024-10-11: 500 mg via ORAL
  Filled 2024-10-10: qty 1

## 2024-10-10 MED ORDER — LACTATED RINGERS IV BOLUS
1000.0000 mL | Freq: Once | INTRAVENOUS | Status: AC
  Administered 2024-10-10: 1000 mL via INTRAVENOUS

## 2024-10-10 MED ORDER — OXYCODONE HCL 5 MG/5ML PO SOLN: 5.0000 mg | Freq: Once | ORAL | Status: DC | PRN

## 2024-10-10 MED ORDER — HYDROCODONE-ACETAMINOPHEN 7.5-325 MG PO TABS: 1.0000 | ORAL_TABLET | ORAL | Status: DC | PRN

## 2024-10-10 MED ORDER — CLONAZEPAM 0.5 MG PO TABS
0.5000 mg | ORAL_TABLET | Freq: Every day | ORAL | Status: DC
  Administered 2024-10-10: 0.5 mg via ORAL
  Filled 2024-10-10: qty 1

## 2024-10-10 MED ORDER — ONDANSETRON HCL 4 MG/2ML IJ SOLN
INTRAMUSCULAR | Status: DC | PRN
  Administered 2024-10-10: 4 mg via INTRAVENOUS

## 2024-10-10 MED ORDER — HYDROCHLOROTHIAZIDE 25 MG PO TABS
25.0000 mg | ORAL_TABLET | Freq: Every day | ORAL | Status: DC
  Administered 2024-10-11: 25 mg via ORAL
  Filled 2024-10-10: qty 1

## 2024-10-10 MED ORDER — MORPHINE SULFATE (PF) 2 MG/ML IV SOLN: 2.0000 mg | INTRAVENOUS | Status: DC | PRN

## 2024-10-10 NOTE — Op Note (Signed)
"      Operative Note  Date of operation: 10/10/2024  Preoperative diagnosis: Left prosthetic hip joint dislocation Postoperative diagnosis: Same  Procedure: Closed reduction of left hip dislocation under anesthesia  Surgeon: Lonni GRADE. Vernetta, MD  Anesthesia: General  Complications: None EBL: Not applicable Antibiotics: None  Indications: The patient is a 74 year old female who sustained a dislocation of her left total hip arthroplasty earlier today when she had been working out and then bent a certain way.  She reports another episode of subluxation more recently as well.  Her original direct anterior hip replacement was done by one of our partners in November of this past year so it is only less than 2 months old in terms of the replacement.  She had been seen in the office in mid December had been doing well.  Again she reports 1 episode of subluxation and this incident happened today.  She was seen at the [facility] emergency room and found to have a dislocation of her left hip prosthesis.  1 attempt was made to try to reduce this under conscious sedation in the ER but that was unsuccessful so we recommended a closed reduction with manipulation under general anesthesia in the operating room.  The risk and benefits of this were discussed with her in detail and informed consent was obtained.  Procedure description: After informed consent was obtained appropriate left hip was marked, the patient was brought to the operating room and general anesthesia was obtained while she was on the stretcher.  Traction boots were placed on both her feet and neck she was placed supine on the Hana fracture table with a perineal post in place.  A timeout was called and she was identified as the correct patient and the correct left hip.  We then assessed the left hip under fluoroscopy.  With some traction and rotation we were able to then reduce the hip dislocation.  We put the hip through flexion and extension  as well as some rotation and it was stable afterwards but there is still certainly concern that the hip had come out of place.  Again it was reduced in an appropriate position so we did place her in a knee immobilizer.  She was then moved over to the stretcher.  Awakened, extubated and taken recovery room in stable condition.  Postoperatively will have admitted overnight for observation as well as physical therapy with strict posterior hip precautions with weightbearing as tolerated but no hip abduction and no significant hip flexion until further notice. "

## 2024-10-10 NOTE — Anesthesia Preprocedure Evaluation (Addendum)
 "                                  Anesthesia Evaluation  Patient identified by MRN, date of birth, ID band Patient awake    Reviewed: Allergy & Precautions, NPO status , Patient's Chart, lab work & pertinent test results, reviewed documented beta blocker date and time   Airway Mallampati: II  TM Distance: >3 FB Neck ROM: Full    Dental  (+) Caps, Partial Upper,    Pulmonary former smoker   breath sounds clear to auscultation       Cardiovascular hypertension, Pt. on medications + CAD, + Past MI and + Cardiac Stents  Normal cardiovascular exam Rhythm:Regular Rate:Normal  NSTEMI DES RCA 05/2013  Echo (2022):   1. Left ventricular ejection fraction by 3D volume is 68 %. The left  ventricle has normal function. The left ventricle has no regional wall  motion abnormalities. Left ventricular diastolic parameters were normal.  The average left ventricular global  longitudinal strain is -22.1 %. The global longitudinal strain is normal.   2. Right ventricular systolic function is normal. The right ventricular  size is normal. There is normal pulmonary artery systolic pressure. The  estimated right ventricular systolic pressure is 21.7 mmHg.   3. The mitral valve is normal in structure. Trivial mitral valve  regurgitation. No evidence of mitral stenosis.   4. The aortic valve is tricuspid. There is mild calcification of the  aortic valve. Aortic valve regurgitation is not visualized. No aortic  stenosis is present.   5. The inferior vena cava is normal in size with greater than 50%  respiratory variability, suggesting right atrial pressure of 3 mmHg.   EKG 10/10/24 NSR, borderline RAD   Neuro/Psych Restless legs syndrome  negative psych ROS   GI/Hepatic negative GI ROS, Neg liver ROS,,,  Endo/Other  Hypothyroidism  HLD  Renal/GU negative Renal ROSLab Results      Component                Value               Date                      NA                       135                  08/13/2024                CL                       98                  08/13/2024                K                        3.4 (L)             08/13/2024                CO2                      25  08/13/2024                BUN                      23                  08/13/2024                CREATININE               0.51                08/13/2024                GFRNONAA                 >60                 08/13/2024                CALCIUM                   9.7                 08/13/2024                ALBUMIN                   4.5                 08/07/2023                GLUCOSE                  101 (H)             08/13/2024                Musculoskeletal  (+) Arthritis , Osteoarthritis,  Dislocated left hip arthroplasty   Abdominal   Peds  Hematology Lab Results      Component                Value               Date                      WBC                      9.6                 08/13/2024                HGB                      15.3 (H)            08/13/2024                HCT                      43.9                08/13/2024                MCV                      88.3                08/13/2024  PLT                      422 (H)             08/13/2024              Anesthesia Other Findings   Reproductive/Obstetrics                              Anesthesia Physical Anesthesia Plan  ASA: 3 and emergent  Anesthesia Plan: General   Post-op Pain Management: Minimal or no pain anticipated   Induction: Intravenous and Cricoid pressure planned  PONV Risk Score and Plan: 3 and Ondansetron  and Treatment may vary due to age or medical condition  Airway Management Planned: LMA and Oral ETT  Additional Equipment: None  Intra-op Plan:   Post-operative Plan: Extubation in OR  Informed Consent: I have reviewed the patients History and Physical, chart, labs and discussed the procedure including the risks,  benefits and alternatives for the proposed anesthesia with the patient or authorized representative who has indicated his/her understanding and acceptance.     Dental advisory given  Plan Discussed with: CRNA and Anesthesiologist  Anesthesia Plan Comments: (   )         Anesthesia Quick Evaluation  "

## 2024-10-10 NOTE — ED Provider Notes (Signed)
 " [place] EMERGENCY DEPARTMENT AT [facility] Provider Note   CSN: [id number] Arrival date & time: 10/10/24  1313     Patient presents with: Hip Pain   Crystal Barnes is a 73 y.o. female.   74 year old female presenting emergency department left hip pain.  Stepped and pivoted on left hip and felt it dislocate/pop out of place.  Did not fall to the ground was able to be lowered by family members.  Did not hit her head no LOC.  Denies numbness tingling changes in sensation.  Had total hip replacement in November by Dr. jerri   Hip Pain       Prior to Admission medications  Medication Sig Start Date End Date Taking? Authorizing Provider  amoxicillin  (AMOXIL ) 500 MG capsule Take 4 pills one hour prior to dental work 09/25/24   Jule Ronal CROME, PA-C  aspirin  (ASPIRIN  81) 81 MG chewable tablet Chew 1 tablet (81 mg total) by mouth 2 (two) times daily. To be taken after surgery to prevent blood clots 08/05/24   Jule Ronal CROME, PA-C  celecoxib  (CELEBREX ) 200 MG capsule Take 1 capsule (200 mg total) by mouth 2 (two) times daily. 06/03/24   Jerri Kay HERO, MD  clonazePAM  (KLONOPIN ) 0.5 MG tablet Take 1-2 tablets (0.5-1 mg total) by mouth at bedtime. 06/30/24   Geofm Glade PARAS, MD  doxycycline  (VIBRA -TABS) 100 MG tablet Take 1 tablet (100 mg total) by mouth 2 (two) times daily. 08/19/24   Jule Ronal CROME, PA-C  Evolocumab  (REPATHA  SURECLICK) 140 MG/ML SOAJ Inject 140 mg into the skin every 14 (fourteen) days. 05/07/24   Croitoru, Mihai, MD  ferrous sulfate 325 (65 FE) MG EC tablet Take 325 mg by mouth daily.    [provider]  gabapentin  (NEURONTIN ) 300 MG capsule TAKE 1 CAPSULE BY MOUTH 3 TIMES A DAY 06/25/24   Geofm Glade PARAS, MD  hydrochlorothiazide  (HYDRODIURIL ) 25 MG tablet TAKE 1 TABLET BY MOUTH DAILY 06/30/24   Geofm Glade PARAS, MD  levothyroxine  (SYNTHROID ) 88 MCG tablet TAKE 1 TABLET BY MOUTH DAILY BEFORE BREAKFAST 12/12/23   Geofm Glade PARAS, MD  MAGNESIUM  PO Take 500 mg  by mouth daily with supper.    [provider]  methocarbamol  (ROBAXIN ) 500 MG tablet Take 1 tablet (500 mg total) by mouth 2 (two) times daily as needed. 08/05/24   Jule Ronal CROME, PA-C  Multiple Minerals-Vitamins (CALCIUM  & VIT D3 BONE HEALTH PO) Take 1,200 mg by mouth daily.    [provider]  Multiple Vitamin (MULTIVITAMIN WITH MINERALS) TABS tablet Take 1 tablet by mouth daily.    [provider]  nitroGLYCERIN  (NITROSTAT ) 0.4 MG SL tablet Place 1 tablet (0.4 mg total) under the tongue every 5 (five) minutes as needed for chest pain. 03/25/24 03/25/25  Madie Jon Garre, PA  ondansetron  (ZOFRAN ) 4 MG tablet Take 1 tablet (4 mg total) by mouth every 8 (eight) hours as needed for nausea or vomiting. 08/05/24   Jule Ronal CROME, PA-C  Polyethyl Glycol-Propyl Glycol (SYSTANE OP) Place 1 drop into both eyes daily as needed (Dry eyes).     [provider]  PSYLLIUM PO Take 15 mLs by mouth at bedtime.    [provider]  rOPINIRole  (REQUIP ) 1 MG tablet TAKE 3 TO 4 TABLETS BY MOUTH AT BEDTIME 02/12/24   Burns, Glade PARAS, MD  rosuvastatin  (CRESTOR ) 5 MG tablet TAKE 1 TABLET BY MOUTH DAILY 04/17/24   Croitoru, Jerel, MD  traMADol  (ULTRAM ) 50  MG tablet Take 1-2 tablets (50-100 mg total) by mouth every 8 (eight) hours as needed (chronic pelvic pain). 10/10/24   Webb, Padonda B, FNP    Allergies: Benadryl  [diphenhydramine ] and Cymbalta  [duloxetine  hcl]    Review of Systems  Updated Vital Signs BP (!) 167/147 (BP Location: Right Arm)   Pulse 97   Temp 97.6 F (36.4 C) (Oral)   Resp (!) 28   Wt 47.6 kg   LMP  (LMP Unknown)   SpO2 (!) 86%   BMI 19.20 kg/m   Physical Exam Vitals and nursing note reviewed.  Constitutional:      General: She is not in acute distress.    Appearance: She is not toxic-appearing.  HENT:     Head: Normocephalic.     Nose: Nose normal.     Mouth/Throat:     Mouth: Mucous membranes are moist.  Eyes:     Conjunctiva/sclera:  Conjunctivae normal.  Cardiovascular:     Rate and Rhythm: Normal rate and regular rhythm.  Pulmonary:     Effort: Pulmonary effort is normal.     Breath sounds: Normal breath sounds.  Abdominal:     General: Abdomen is flat. There is no distension.     Tenderness: There is no abdominal tenderness. There is no guarding or rebound.  Musculoskeletal:     Comments: Left hip with shortening and external rotation, some tenderness to left hip.  Strong pulses.  Neuro vastly intact able to wiggle toes and plantarflex dorsiflex  Neurological:     General: No focal deficit present.     Mental Status: She is alert.  Psychiatric:        Mood and Affect: Mood normal.        Behavior: Behavior normal.     (all labs ordered are listed, but only abnormal results are displayed) Labs Reviewed  CBC  BASIC METABOLIC PANEL WITH GFR    EKG: None  Radiology: DG Pelvis Portable Result Date: 10/10/2024 CLINICAL DATA:  Hip dislocation.  Pain. EXAM: DG PORTABLE PELVIS COMPARISON:  09/25/2024 FINDINGS: Superior dislocation of the femoral component of left hip arthroplasty. The acetabular cup remains in place. There is no evidence of acute fracture. Intact bony pelvis. Stable calcification projecting over the pubic body. 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 IMPRESSION: Superior dislocation of the femoral component of left hip arthroplasty. No evidence of acute fracture. Electronically Signed   By: Andrea Gasman M.D.   On: 10/10/2024 15:43     .Ortho Injury Treatment  Date/Time: 10/10/2024 5:02 PM  Performed by: Neysa Caron PARAS, DO Authorized by: Neysa Caron PARAS, DO   Consent:    Consent obtained:  Written and verbal   Consent given by:  Patient   Risks discussed:  Fracture, restricted joint movement, stiffness, vascular damage, recurrent dislocation and nerve damage   Alternatives discussed:  No treatment and alternative treatment Universal protocol:     Procedure explained and questions answered to patient or proxy's satisfaction: yes     Patient identity confirmed:  Verbally with patientInjury location: hip Location details: left hip Injury type: dislocation Pre-procedure neurovascular assessment: neurovascularly intact Pre-procedure distal perfusion: normal Manipulation performed: yes Reduction method: flexion, distraction and internal/external rotation. Reduction successful: no Post-procedure neurovascular assessment: post-procedure neurovascularly intact Post-procedure distal perfusion: normal Post-procedure neurological function: normal Post-procedure range of motion: normal   .Sedation  Date/Time: 10/10/2024 5:04 PM  Performed by: Neysa Caron PARAS, DO Authorized by: Neysa Caron PARAS, DO  Consent:    Consent obtained:  Verbal and written   Consent given by:  Patient   Risks discussed:  Allergic reaction, dysrhythmia, inadequate sedation, respiratory compromise necessitating ventilatory assistance and intubation, prolonged sedation necessitating reversal, prolonged hypoxia resulting in organ damage, nausea and vomiting   Alternatives discussed:  Analgesia without sedation Universal protocol:    Immediately prior to procedure, a time out was called: yes     Patient identity confirmed:  Arm band Indications:    Procedure performed:  Dislocation reduction   Procedure necessitating sedation performed by:  Physician performing sedation Pre-sedation assessment:    Time since last food or drink:  10   ASA classification: class 2 - patient with mild systemic disease     Mouth opening:  3 or more finger widths   Thyromental distance:  4 finger widths   Mallampati score:  III - soft palate, base of uvula visible   Neck mobility: normal     Pre-sedation assessments completed and reviewed: airway patency, cardiovascular function, hydration status, mental status, nausea/vomiting, pain level, respiratory function and temperature   A  pre-sedation assessment was completed prior to the start of the procedure Immediate pre-procedure details:    Reassessment: Patient reassessed immediately prior to procedure     Reviewed: vital signs     Verified: bag valve mask available, emergency equipment available, intubation equipment available, IV patency confirmed, oxygen available and suction available   Procedure details (see MAR for exact dosages):    Preoxygenation:  Nasal cannula   Sedation:  Propofol    Intended level of sedation: deep   Analgesia:  Morphine   Intra-procedure monitoring:  Blood pressure monitoring, cardiac monitor, continuous capnometry and continuous pulse oximetry   Intra-procedure events: none     Total Provider sedation time (minutes):  10 Post-procedure details:   A post-sedation assessment was completed following the completion of the procedure.   Attendance: Constant attendance by certified staff until patient recovered     Recovery: Patient returned to pre-procedure baseline     Procedure completion:  Tolerated well, no immediate complications Comments:     Initially sedated by myself for initial reduction attempt.  Unsuccessful.      Medications Ordered in the ED  lactated ringers  bolus 1,000 mL (has no administration in time range)  propofol  (DIPRIVAN ) 10 mg/mL bolus/IV push 47.6 mg (has no administration in time range)  morphine (PF) 2 MG/ML injection 2 mg (has no administration in time range)  morphine (PF) 2 MG/ML injection 2 mg (2 mg Intravenous Given 10/10/24 1411)  propofol  (DIPRIVAN ) 10 mg/mL bolus/IV push 47.6 mg (100 mg Intravenous Given 10/10/24 1549)  morphine (PF) 2 MG/ML injection 2 mg (2 mg Intravenous Given 10/10/24 1512)    Clinical Course as of 10/10/24 1728  Fri Oct 10, 2024  1618 Spoke with Dr. Harden, he or Dr. Vernetta will come attempt to reduce if we are able to facilitate sedation. [TY]  1728 Ortho presented to bedside.  They were also unable to reduce the hip.  They will take  him to the OR later. [TY]    Clinical Course User Index [TY] Neysa Caron PARAS, DO                                 Medical Decision Making This is a 74 year old female presenting emergency department with left hip pain.  She is afebrile nontachycardic, hypertensive.  Physical exam concerning for  fracture versus dislocation.  Per chart review recent total hip replacement in November with Dr.Xu.  Attempted to reduce, but unsuccessful.  See procedure note.  Dr. Harden with Ortho consulted and will come try to put hip back in place.  Patient given several rounds of morphine secondary to pain.  Basic screening labs ordered as well.   Amount and/or Complexity of Data Reviewed Labs: ordered. Radiology: ordered. ECG/medicine tests: ordered.  Risk Prescription drug management.      Final diagnoses:  None    ED Discharge Orders     None          Neysa Caron PARAS, DO 10/10/24 1711  "

## 2024-10-10 NOTE — Anesthesia Postprocedure Evaluation (Signed)
"   Anesthesia Post Note  Patient: Crystal Barnes  Procedure(s) Performed: CLOSED REDUCTION, HIP (Left: Hip)     Patient location during evaluation: PACU Anesthesia Type: General Level of consciousness: awake and alert and oriented Pain management: pain level controlled Vital Signs Assessment: post-procedure vital signs reviewed and stable Respiratory status: spontaneous breathing, nonlabored ventilation and respiratory function stable Cardiovascular status: blood pressure returned to baseline and stable Postop Assessment: no apparent nausea or vomiting Anesthetic complications: no   No notable events documented.  Last Vitals:  Vitals:   10/10/24 1917 10/10/24 1930  BP: (!) 191/108 (!) 145/82  Pulse: 92 83  Resp: 14 14  Temp:    SpO2: 94% 96%    Last Pain:  Vitals:   10/10/24 1917  TempSrc:   PainSc: 5                  Yuvan Medinger A.      "

## 2024-10-10 NOTE — ED Notes (Signed)
 Paper consent signed.

## 2024-10-10 NOTE — Consult Note (Signed)
 "  ORTHOPAEDIC CONSULTATION  REQUESTING PHYSICIAN: Mannie Fairy DASEN, DO  Chief Complaint: Dislocated left total hip arthroplasty.  HPI: Crystal Barnes is a 74 y.o. female who presents with dislocated left total hip arthroplasty.  Patient is status post total hip arthroplasty on November 10.  Patient states she has had episodes of the hip feeling like it is subluxing out of the joint.  She states that this time it dislocated and had acute pain with shortening of the left lower extremity.  Past Medical History:  Diagnosis Date   Arthritis    CAD (coronary artery disease)    Cataract    bilateral-removed   Hyperlipidemia    Hypertension    Hypothyroidism    Myocardial infarction (HCC) 06/01/2013   NSTEMI, s/p DES mRCA, 60% mLAD treated medically   Restless leg syndrome    Thyroid  disease    Past Surgical History:  Procedure Laterality Date   ABDOMINAL HYSTERECTOMY  2008   CATARACT EXTRACTION Bilateral    COLONOSCOPY     CORONARY STENT PLACEMENT  2014   ENDOSCOPIC MUCOSAL RESECTION N/A 11/24/2019   Procedure: ENDOSCOPIC MUCOSAL RESECTION;  Surgeon: Wilhelmenia Aloha Raddle., MD;  Location: [facility] ENDOSCOPY;  Service: Gastroenterology;  Laterality: N/A;   ENDOSCOPIC MUCOSAL RESECTION  08/04/2020   Procedure: ENDOSCOPIC MUCOSAL RESECTION;  Surgeon: Wilhelmenia Aloha Raddle., MD;  Location: [name];  Service: Gastroenterology;;   ENID SIGMOIDOSCOPY N/A 11/24/2019   Procedure: ENID MORIN;  Surgeon: Wilhelmenia Aloha Raddle., MD;  Location: [facility] ENDOSCOPY;  Service: Gastroenterology;  Laterality: N/A;   FLEXIBLE SIGMOIDOSCOPY N/A 08/04/2020   Procedure: FLEXIBLE SIGMOIDOSCOPY;  Surgeon: Wilhelmenia Aloha Raddle., MD;  Location: [name];  Service: Gastroenterology;  Laterality: N/A;   HEMOSTASIS CLIP PLACEMENT  11/24/2019   Procedure: HEMOSTASIS CLIP PLACEMENT;  Surgeon: Wilhelmenia Aloha Raddle., MD;  Location: [facility] ENDOSCOPY;  Service: Gastroenterology;;   HEMOSTASIS CLIP  PLACEMENT  08/04/2020   Procedure: HEMOSTASIS CLIP PLACEMENT;  Surgeon: Wilhelmenia Aloha Raddle., MD;  Location: [name];  Service: Gastroenterology;;   LASIK Bilateral    POLYPECTOMY     SUBMUCOSAL LIFTING INJECTION  11/24/2019   Procedure: SUBMUCOSAL LIFTING INJECTION;  Surgeon: Wilhelmenia Aloha Raddle., MD;  Location: [facility]COPY;  Service: Gastroenterology;;   SUBMUCOSAL LIFTING INJECTION  08/04/2020   Procedure: SUBMUCOSAL LIFTING INJECTION;  Surgeon: Wilhelmenia Aloha Raddle., MD;  Location: [name];  Service: Gastroenterology;;   TONSILLECTOMY     TOTAL HIP ARTHROPLASTY Left 08/18/2024   Procedure: ARTHROPLASTY, HIP, TOTAL, ANTERIOR APPROACH;  Surgeon: Jerri Kay HERO, MD;  Location: [facility] OR;  Service: Orthopedics;  Laterality: Left;  3-C   Social History   Socioeconomic History   Marital status: Single    Spouse name: Not on file   Number of children: Not on file   Years of education: Not on file   Highest education level: Doctorate  Occupational History   Occupation: Retired     Comment: about a year and half ago  Tobacco Use   Smoking status: Former    Current packs/day: 0.00    Types: Cigarettes    Quit date: 10/09/1992    Years since quitting: 32.0   Smokeless tobacco: Never  Vaping Use   Vaping status: Never Used  Substance and Sexual Activity   Alcohol use: Not Currently   Drug use: Never   Sexual activity: Not Currently    Birth control/protection: Post-menopausal  Other Topics Concern   Not on file  Social History Narrative   Single   Social Drivers  of Health   Tobacco Use: Medium Risk (10/06/2024)   Patient History    Smoking Tobacco Use: Former    Smokeless Tobacco Use: Never    Passive Exposure: Not on file  Financial Resource Strain: Low Risk (08/19/2024)   Overall Financial Resource Strain (CARDIA)    Difficulty of Paying Living Expenses: Not hard at all  Food Insecurity: No Food Insecurity (09/26/2024)   Epic    Worried About Brewing Technologist in the Last Year: Never true    Ran Out of Food in the Last Year: Never true  Transportation Needs: No Transportation Needs (09/26/2024)   Epic    Lack of Transportation (Medical): No    Lack of Transportation (Non-Medical): No  Physical Activity: Sufficiently Active (09/26/2024)   Exercise Vital Sign    Days of Exercise per Week: 7 days    Minutes of Exercise per Session: 30 min  Recent Concern: Physical Activity - Inactive (08/19/2024)   Exercise Vital Sign    Days of Exercise per Week: 0 days    Minutes of Exercise per Session: Not on file  Stress: No Stress Concern Present (09/26/2024)   [employer] of Occupational Health - Occupational Stress Questionnaire    Feeling of Stress: Not at all  Social Connections: Socially Isolated (09/26/2024)   Social Connection and Isolation Panel    Frequency of Communication with Friends and Family: More than three times a week    Frequency of Social Gatherings with Friends and Family: More than three times a week    Attends Religious Services: Never    Database Administrator or Organizations: No    Attends Banker Meetings: Never    Marital Status: Never married  Depression (PHQ2-9): Low Risk (09/26/2024)   Depression (PHQ2-9)    PHQ-2 Score: 4  Alcohol Screen: Low Risk (08/19/2024)   Alcohol Screen    Last Alcohol Screening Score (AUDIT): 1  Housing: Unknown (09/26/2024)   Epic    Unable to Pay for Housing in the Last Year: No    Number of Times Moved in the Last Year: Not on file    Homeless in the Last Year: No  Utilities: Not At Risk (09/26/2024)   Epic    Threatened with loss of utilities: No  Health Literacy: Adequate Health Literacy (09/26/2024)   B1300 Health Literacy    Frequency of need for help with medical instructions: Never   Family History  Problem Relation Age of Onset   Colon polyps Mother    Esophageal cancer Father    Colon cancer Neg Hx    Rectal cancer Neg Hx    Stomach cancer Neg Hx     Inflammatory bowel disease Neg Hx    Liver disease Neg Hx    Pancreatic cancer Neg Hx    - negative except otherwise stated in the family history section Allergies[1] Prior to Admission medications  Medication Sig Start Date End Date Taking? Authorizing Provider  amoxicillin  (AMOXIL ) 500 MG capsule Take 4 pills one hour prior to dental work 09/25/24   Jule Ronal CROME, PA-C  aspirin  (ASPIRIN  81) 81 MG chewable tablet Chew 1 tablet (81 mg total) by mouth 2 (two) times daily. To be taken after surgery to prevent blood clots 08/05/24   Jule Ronal CROME, PA-C  celecoxib  (CELEBREX ) 200 MG capsule Take 1 capsule (200 mg total) by mouth 2 (two) times daily. 06/03/24   Jerri Kay HERO, MD  clonazePAM  (KLONOPIN ) 0.5 MG tablet Take  1-2 tablets (0.5-1 mg total) by mouth at bedtime. 06/30/24   Geofm Glade PARAS, MD  doxycycline  (VIBRA -TABS) 100 MG tablet Take 1 tablet (100 mg total) by mouth 2 (two) times daily. 08/19/24   Jule Ronal CROME, PA-C  Evolocumab  (REPATHA  SURECLICK) 140 MG/ML SOAJ Inject 140 mg into the skin every 14 (fourteen) days. 05/07/24   Croitoru, Mihai, MD  ferrous sulfate 325 (65 FE) MG EC tablet Take 325 mg by mouth daily.    [provider]  gabapentin  (NEURONTIN ) 300 MG capsule TAKE 1 CAPSULE BY MOUTH 3 TIMES A DAY 06/25/24   Geofm Glade PARAS, MD  hydrochlorothiazide  (HYDRODIURIL ) 25 MG tablet TAKE 1 TABLET BY MOUTH DAILY 06/30/24   Geofm Glade PARAS, MD  levothyroxine  (SYNTHROID ) 88 MCG tablet TAKE 1 TABLET BY MOUTH DAILY BEFORE BREAKFAST 12/12/23   Geofm Glade PARAS, MD  MAGNESIUM  PO Take 500 mg by mouth daily with supper.    [provider]  methocarbamol  (ROBAXIN ) 500 MG tablet Take 1 tablet (500 mg total) by mouth 2 (two) times daily as needed. 08/05/24   Jule Ronal CROME, PA-C  Multiple Minerals-Vitamins (CALCIUM  & VIT D3 BONE HEALTH PO) Take 1,200 mg by mouth daily.    [provider]  Multiple Vitamin (MULTIVITAMIN WITH MINERALS) TABS tablet Take 1 tablet by mouth  daily.    [provider]  nitroGLYCERIN  (NITROSTAT ) 0.4 MG SL tablet Place 1 tablet (0.4 mg total) under the tongue every 5 (five) minutes as needed for chest pain. 03/25/24 03/25/25  Duke, Jon Garre, PA  ondansetron  (ZOFRAN ) 4 MG tablet Take 1 tablet (4 mg total) by mouth every 8 (eight) hours as needed for nausea or vomiting. 08/05/24   Jule Ronal CROME, PA-C  Polyethyl Glycol-Propyl Glycol (SYSTANE OP) Place 1 drop into both eyes daily as needed (Dry eyes).     [provider]  PSYLLIUM PO Take 15 mLs by mouth at bedtime.    [provider]  rOPINIRole  (REQUIP ) 1 MG tablet TAKE 3 TO 4 TABLETS BY MOUTH AT BEDTIME 02/12/24   Burns, Glade PARAS, MD  rosuvastatin  (CRESTOR ) 5 MG tablet TAKE 1 TABLET BY MOUTH DAILY 04/17/24   Croitoru, Jerel, MD  traMADol  (ULTRAM ) 50 MG tablet Take 1-2 tablets (50-100 mg total) by mouth every 8 (eight) hours as needed (chronic pelvic pain). 10/10/24   Douglass Kenney NOVAK, FNP   DG Pelvis Portable Result Date: 10/10/2024 CLINICAL DATA:  Hip dislocation.  Pain. EXAM: DG PORTABLE PELVIS COMPARISON:  09/25/2024 FINDINGS: Superior dislocation of the femoral component of left hip arthroplasty. The acetabular cup remains in place. There is no evidence of acute fracture. Intact bony pelvis. Stable calcification projecting over the pubic body. 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 IMPRESSION: Superior dislocation of the femoral component of left hip arthroplasty. No evidence of acute fracture. Electronically Signed   By: Andrea Gasman M.D.   On: 10/10/2024 15:43   - pertinent xrays, CT, MRI studies were reviewed and independently interpreted  Positive ROS: All other systems have been reviewed and were otherwise negative with the exception of those mentioned in the HPI and as above.  Physical Exam: General: Alert, no acute distress Psychiatric: Patient is competent for consent with normal mood and affect Lymphatic:  No axillary or cervical lymphadenopathy Cardiovascular: No pedal edema Respiratory: No cyanosis, no use of accessory musculature GI: No organomegaly, abdomen is soft and non-tender    Images:  @ENCIMAGES @  Labs:  Lab Results  Component Value Date   HGBA1C 5.3 04/18/2023   HGBA1C 4.8 07/23/2020    Lab Results  Component Value Date   ALBUMIN  4.5 08/07/2023   ALBUMIN  4.3 04/13/2023   ALBUMIN  4.3 07/14/2022        Latest Ref Rng & Units 08/13/2024   11:00 AM 08/07/2023    4:07 PM 04/13/2023   10:33 AM  CBC EXTENDED  WBC 4.0 - 10.5 K/uL 9.6  8.7  5.6   RBC 3.87 - 5.11 MIL/uL 4.97  4.77  4.66   Hemoglobin 12.0 - 15.0 g/dL 84.6  85.2  85.7   HCT 36.0 - 46.0 % 43.9  43.4  42.4   Platelets 150 - 400 K/uL 422  544.0  308.0   NEUT# 1.4 - 7.7 K/uL  5.1  2.6   Lymph# 0.7 - 4.0 K/uL  2.7  2.1     Neurologic: Patient does not have protective sensation bilateral lower extremities.   MUSCULOSKELETAL:   Skin: Examination the surgical incision is intact no drainage no cellulitis no signs of infection.  The left lower extremity is shortened and externally rotated.  Passively the hip does not internally or externally rotate.  Review of the radiographs shows a posterior superior dislocation of the left hip.  After informed consent and conscious sedation in the ER Dr. Vernetta and myself attempted closed reduction.  This was unsuccessful.  Assessment: Assessment: Dislocated left total hip arthroplasty.  Plan: Plan: Will plan for bringing patient to the operating room for close reduction under general anesthetic.  Risks and benefits were discussed with the patient and her sister.  Patient's sister signed the permit due to the fact the patient just had propofol  for conscious sedation.  Patient is agreeable to proceeding to the operating room for close reduction.  Thank you for the consult and the opportunity to see Crystal Barnes  Crystal Sage, MD [facility] [phone] 5:38 PM         [1]  Allergies Allergen Reactions   Benadryl  [Diphenhydramine ]     RLS   Cymbalta  [Duloxetine  Hcl]     Worsening of leg cramps   "

## 2024-10-10 NOTE — ED Notes (Signed)
 Respiratory Therapist at [facility]  in room number __resus_ during procedure.  Suction with [facility] at [facility] set up and ready to use. Ambu bag at [facility] and ready to use.  Patient placed on ETCO2 Nasal Cannula at _plus 100% BMV 15 LPM.    Vitals at conclusion of procedure:  ETCO2 _33_ mmHg HR 98 RR 17 SPO2 100

## 2024-10-10 NOTE — Transfer of Care (Signed)
 Immediate Anesthesia Transfer of Care Note  Patient: Crystal Barnes  Procedure(s) Performed: CLOSED REDUCTION, HIP (Left: Hip)  Patient Location: PACU  Anesthesia Type:General  Level of Consciousness: awake and patient cooperative  Airway & Oxygen Therapy: Patient Spontanous Breathing  Post-op Assessment: Report given to RN and Post -op Vital signs reviewed and stable  Post vital signs: Reviewed and stable  Last Vitals:  Vitals Value Taken Time  BP 143/78 10/10/24 18:39  Temp    Pulse 87 10/10/24 18:42  Resp 15 10/10/24 18:42  SpO2 100 % 10/10/24 18:42  Vitals shown include unfiled device data.  Last Pain:  Vitals:   10/10/24 1754  TempSrc: Oral  PainSc: 9          Complications: No notable events documented.

## 2024-10-10 NOTE — ED Notes (Signed)
 Respiratory Therapist at [facility]  in room number __resus_ during procedure.  Suction with Ya[facility]er at [facility] set up and ready to use. Ambu bag at [facility] and ready to use.  Patient placed on ETCO2 Nasal Cannula at __2__ LPM.    Vitals at conclusion of procedure:  ETCO2 ___24_ mmHg HR 85 RR 10 SPO2 100

## 2024-10-10 NOTE — ED Triage Notes (Signed)
 Left hip external rotation with some lengthening. Hip replacement on 08/18/24. Was standing and and turned, felt the hip pop out. Pain is minimal at this time. Able to wiggle her toes.

## 2024-10-10 NOTE — Anesthesia Procedure Notes (Signed)
 Procedure Name: Intubation Date/Time: 10/10/2024 6:11 PM  Performed by: Judythe Tanda Aran, CRNAPre-anesthesia Checklist: Patient identified, Emergency Drugs available, Suction available and Patient being monitored Patient Re-evaluated:Patient Re-evaluated prior to induction Oxygen Delivery Method: Circle system utilized Preoxygenation: Pre-oxygenation with 100% oxygen Induction Type: IV induction Ventilation: Mask ventilation without difficulty Laryngoscope Size: 2 and Miller Grade View: Grade I Tube type: Oral Tube size: 7.0 mm Number of attempts: 1 Airway Equipment and Method: Stylet Placement Confirmation: ETT inserted through vocal cords under direct vision, positive ETCO2 and breath sounds checked- equal and bilateral Tube secured with: Tape Dental Injury: Teeth and Oropharynx as per pre-operative assessment

## 2024-10-11 ENCOUNTER — Encounter (HOSPITAL_COMMUNITY): Payer: Self-pay | Admitting: Orthopaedic Surgery

## 2024-10-11 MED ORDER — HYDROCODONE-ACETAMINOPHEN 5-325 MG PO TABS: 1.0000 | ORAL_TABLET | Freq: Four times a day (QID) | ORAL | 0 refills | Status: DC | PRN

## 2024-10-11 NOTE — Progress Notes (Signed)
 AVS reviewed with patient who verbalized an understanding. PIV removed as noted. Patient dressed for discharge. Pat asked for pain medicine prior to discharge due to increased pain - primary nurse updated by this RN.Patient aware that primary nurse would need to check to see if pain med was due at this time. Ride 10 minutes out

## 2024-10-11 NOTE — Discharge Instructions (Signed)
 No exercising at all for the next 2 to 4 weeks. Limit flexing your left hip and abducting your left hip. Occasionally come out of the knee immobilizer to rest your leg. Do wear the knee immobilizer when you are up and active as well as when you sleep.

## 2024-10-11 NOTE — Discharge Summary (Signed)
 " Patient ID: Crystal Barnes MRN: [id number] DOB/AGE: May 30, 1951 74 y.o.  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 female who was admitted 10/10/2024 for operative treatment ofDislocation of internal left hip prosthesis. Patient has severe unremitting pain that affects sleep, daily activities, and work/hobbies. After pre-op clearance the patient was taken to the operating room on 10/10/2024 and underwent  Procedures: CLOSED REDUCTION, HIP.    Patient was given perioperative antibiotics:  Anti-infectives (From admission, onward)    None        Patient was given sequential compression devices, early ambulation, and chemoprophylaxis to prevent DVT.  Inpatient Morphine  Milligram Equivalents Per Day 1/2 - 1/3   Values displayed are in units of MME/Day    Order Start / End Date Yesterday Today    oxyCODONE  (Oxy IR/ROXICODONE ) immediate release tablet 5 mg 1/2 - 1/2 0 of Unknown --    oxyCODONE  (ROXICODONE ) 5 MG/5ML solution 5 mg 1/2 - 1/2 0 of Unknown --      Group total: 0 of Unknown     morphine  (PF) 2 MG/ML injection 2 mg 1/2 - 1/2 6 of 6 --    morphine  (PF) 2 MG/ML injection 2 mg 1/2 - 1/2 6 of 6 --    morphine  (PF) 2 MG/ML injection 2 mg 1/2 - 1/2 0 of 6 --    morphine  (PF) 2  MG/ML injection 2 mg 1/2 - 1/2 0 of 6 --    fentaNYL  (SUBLIMAZE ) injection 25-50 mcg 1/2 - 1/2 30 of 45-90 --    fentaNYL  (SUBLIMAZE ) injection 1/2 - 1/2 *30 of 30 --    HYDROcodone -acetaminophen  (NORCO/VICODIN) 5-325 MG per tablet 1-2 tablet 1/2 - No end date 0 of 5-10 10 of 30-60    HYDROcodone -acetaminophen  (NORCO) 7.5-325 MG per tablet 1-2 tablet 1/2 - No end date 0 of 7.5-15 0 of 45-90    morphine  (PF) 2 MG/ML injection 0.5-1 mg 1/2 - No end date 0 of 3-6 0 of 18-36    traMADol  (ULTRAM ) tablet 50-100 mg 1/2 - No end date 5 of 5-10 0 of 15-30    Daily Totals  * 77 of Unknown (at least 119.5-185) 10 of 108-216  *One-Step medication  Calculation Errors     Order Type Date Details   oxyCODONE  (Oxy IR/ROXICODONE ) immediate release tablet 5 mg Ordered Dose -- Insufficient frequency information   oxyCODONE  (ROXICODONE ) 5 MG/5ML solution 5 mg Ordered Dose -- Insufficient frequency information            Patient benefited maximally from hospital stay and there were no complications.    Recent vital signs: Patient Vitals for the past 24 hrs:  BP Temp Temp src Pulse Resp SpO2 Height Weight  10/11/24 1038 (!) 149/85 97.9  F (36.6 C) -- 88 16 100 % -- --  10/11/24 0550 134/79 99.3 F (37.4 C) Oral 96 18 97 % -- --  10/11/24 0211 116/73 98.4 F (36.9 C) Oral 94 18 98 % -- --  10/10/24 2315 104/66 99.6 F (37.6 C) Oral 95 18 98 % -- --  10/10/24 2208 126/68 99.7 F (37.6 C) Oral (!) 101 18 99 % -- --  10/10/24 2101 124/76 99.5 F (37.5 C) Oral (!) 103 18 99 % -- --  10/10/24 2031 -- -- -- -- -- -- 5' 2 (1.575 m) 47.6 kg  10/10/24 2008 (!) 142/67 99.3 F (37.4 C) Oral 91 17 98 % -- --  10/10/24 1945 (!) 156/84 99.2 F (37.3 C) -- 90 12 97 % -- --  10/10/24 1930 (!) 145/82 -- -- 83 14 96 % -- --  10/10/24 1917 (!) 191/108 -- -- 92 14 94 % -- --  10/10/24 1915 (!) 194/152 -- -- 92 18 98 % -- --  10/10/24 1900 (!) 144/82 -- -- 81 14 98 % -- --  10/10/24 1845 (!) 147/88 -- -- 89 13  100 % -- --  10/10/24 1838 (!) 143/78 97.7 F (36.5 C) -- 87 13 100 % -- --  10/10/24 1754 (!) 175/97 99.1 F (37.3 C) Oral 90 18 98 % -- --  10/10/24 1719 (!) 205/117 98 F (36.7 C) -- 90 20 100 % -- --  10/10/24 1718 (!) 175/97 98 F (36.7 C) -- 89 20 (!) 81 % -- --  10/10/24 1717 (!) 175/97 97.8 F (36.6 C) -- 100 (!) 22 100 % -- --  10/10/24 1714 (!) 164/100 97.6 F (36.4 C) -- 98 (!) 24 100 % -- --  10/10/24 1555 (!) 167/147 -- -- 97 (!) 28 (!) 86 % -- --  10/10/24 1546 (!) 156/104 -- Oral 88 16 100 % -- --  10/10/24 1509 -- -- -- -- -- -- -- 47.6 kg  10/10/24 1322 (!) 157/99 97.6 F (36.4 C) Oral 73 16 100 % -- --     Recent laboratory studies: No results for input(s): WBC, HGB, HCT, PLT, NA, K, CL, CO2, BUN, CREATININE, GLUCOSE, INR, CALCIUM  in the last 72 hours.  Invalid input(s): PT, 2   Discharge Medications:   Allergies as of 10/11/2024       Reactions   Benadryl  [diphenhydramine ]    RLS   Cymbalta  [duloxetine  Hcl]    Worsening of leg cramps        Medication List     STOP taking these medications    traMADol  50 MG tablet Commonly known as: ULTRAM        TAKE these medications    rOPINIRole  1 MG tablet Commonly known as: REQUIP  TAKE 3 TO 4 TABLETS BY MOUTH AT BEDTIME What changed: See the new instructions. The timing of this medication is very important.   amoxicillin  500 MG capsule Commonly known as: AMOXIL  Take 4 pills one hour prior to dental work   aspirin  81 MG chewable tablet Commonly known as: Aspirin  81 Chew 1 tablet (81 mg total) by mouth 2 (two) times daily. To be taken after surgery to prevent blood clots   CALCIUM  & VIT D3 BONE HEALTH PO Take 1,200 mg by mouth daily.   celecoxib  200 MG capsule Commonly known as: CELEBREX  Take 1 capsule (200 mg total) by mouth 2 (two) times daily.   clonazePAM  0.5 MG tablet Commonly known as: KLONOPIN  Take 1-2 tablets (0.5-1  mg total) by mouth at bedtime.    doxycycline  100 MG tablet Commonly known as: VIBRA -TABS Take 1 tablet (100 mg total) by mouth 2 (two) times daily.   ferrous sulfate  325 (65 FE) MG EC tablet Take 325 mg by mouth daily.   gabapentin  300 MG capsule Commonly known as: NEURONTIN  TAKE 1 CAPSULE BY MOUTH 3 TIMES A DAY   hydrochlorothiazide  25 MG tablet Commonly known as: HYDRODIURIL  TAKE 1 TABLET BY MOUTH DAILY   HYDROcodone -acetaminophen  5-325 MG tablet Commonly known as: NORCO/VICODIN Take 1-2 tablets by mouth every 6 (six) hours as needed for moderate pain (pain score 4-6).   levothyroxine  88 MCG tablet Commonly known as: SYNTHROID  TAKE 1 TABLET BY MOUTH DAILY BEFORE BREAKFAST   MAGNESIUM  PO Take 500 mg by mouth daily with supper.   methocarbamol  500 MG tablet Commonly known as: ROBAXIN  Take 1 tablet (500 mg total) by mouth 2 (two) times daily as needed.   multivitamin with minerals Tabs tablet Take 1 tablet by mouth daily.   nitroGLYCERIN  0.4 MG SL tablet Commonly known as: Nitrostat  Place 1 tablet (0.4 mg total) under the tongue every 5 (five) minutes as needed for chest pain.   ondansetron  4 MG tablet Commonly known as: Zofran  Take 1 tablet (4 mg total) by mouth every 8 (eight) hours as needed for nausea or vomiting.   PSYLLIUM PO Take 15 mLs by mouth at bedtime.   Repatha  SureClick 140 MG/ML Soaj Generic drug: Evolocumab  Inject 140 mg into the skin every 14 (fourteen) days.   rosuvastatin  5 MG tablet Commonly known as: CRESTOR  TAKE 1 TABLET BY MOUTH DAILY   SYSTANE OP Place 1 drop into both eyes daily as needed (Dry eyes).        Diagnostic Studies: DG Pelvis Portable Result Date: 10/11/2024 EXAM: 1 or 2 VIEW(S) XRAY OF THE PELVIS 10/10/2024 07:10:00 PM COMPARISON: 10/10/2024 CLINICAL HISTORY: Status post closed reduction of dislocated total hip prosthesis. FINDINGS: BONES AND JOINTS: Interval reduction of previously dislocated left hip arthroplasty, now in anatomic alignment.  Degenerative changes of right hip. No acute fracture. SOFT TISSUES: Foley catheter in place. IMPRESSION: 1. Interval reduction of previously dislocated left hip arthroplasty, now in anatomic alignment. Electronically signed by: Franky Stanford MD 10/11/2024 02:19 AM EST RP Workstation: [id number]   DG HIP UNILAT WITH PELVIS 1V LEFT Result Date: 10/11/2024 EXAM: FLUOROSCOPIC IMAGING TECHNIQUE: Fluoroscopy was provided by the radiology department for procedure. Radiologist was not present during examination. RADIATION DOSE INDEX: Reference Air Kerma: 1.49 mGy COMPARISON: None available. CLINICAL HISTORY: [id number] S/P closed reduction of dislocated total hip prosthesis [id number] S/P closed reduction of dislocated total hip prosthesis FINDINGS: Intraoperative fluoroscopic imaging was performed for 13 seconds during closed reduction of dislocated total hip prosthesis. IMPRESSION: 1. Intraoperative fluoroscopic imaging as above. 2. Please refer to the operative report for full details. Electronically signed by: Franky Stanford MD 10/11/2024 02:18 AM EST RP Workstation: [id number]   DG C-Arm 1-60 Min-No Report Result Date: 10/10/2024 Fluoroscopy was utilized by the requesting physician.  No radiographic interpretation.   DG Pelvis Portable Result Date: 10/10/2024 CLINICAL DATA:  Hip dislocation.  Pain. EXAM: DG PORTABLE PELVIS COMPARISON:  09/25/2024 FINDINGS: Superior dislocation of the femoral component of left hip arthroplasty. The acetabular cup remains in place. There is no evidence of acute fracture. Intact bony pelvis. Stable calcification projecting over the pubic body. 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 IMPRESSION: Superior dislocation of the femoral component of left hip arthroplasty. No  evidence of acute fracture. Electronically Signed   By: Andrea Gasman M.D.   On: 10/10/2024 15:43   XR Pelvis 1-2 Views Result Date: 09/25/2024 Well-seated prosthesis without  complication  DG BONE DENSITY (DXA) Result Date: 09/24/2024 EXAM: DUAL X-RAY ABSORPTIOMETRY (DXA) FOR BONE MINERAL DENSITY 09/24/2024 3:55 pm CLINICAL DATA:  74 year old Female Postmenopausal. Evaluate bone quality History of hip fracture. TECHNIQUE: An axial (e.g., hips, spine) and/or appendicular (e.g., radius) exam was performed, as appropriate, using GE Secretary/administrator at [employer]. Images are obtained for bone mineral density measurement and are not obtained for diagnostic purposes. [id number] Exclusions: Left hip due to fracture. COMPARISON:  None. New baseline. FINDINGS: Scan quality: Good. LUMBAR SPINE (L1-L4): BMD (in g/cm2): 1.051 T-score: -1.1 Z-score: 0.6 RIGHT FEMORAL NECK: BMD (in g/cm2): 0.714 T-score: -2.3 Z-score: -0.6 RIGHT TOTAL HIP: BMD (in g/cm2): 0.782 T-score: -1.8 Z-score: -0.3 LEFT FOREARM (RADIUS 33%): BMD (in g/cm2): 0.656 T-score: -2.5 Z-score: -0.6 FRAX 10-YEAR PROBABILITY OF FRACTURE: FRAX not reported as the lowest BMD is not in the osteopenia range. IMPRESSION: Osteoporosis based on BMD. Fracture risk is increased. Increased risk is based on low BMD and history of hip fracture. RECOMMENDATIONS: 1. All patients should optimize calcium  and vitamin D  intake. 2. Consider FDA-approved medical therapies in postmenopausal women and men aged 31 years and older, based on the following: - A hip or vertebral (clinical or morphometric) fracture - T-score less than or equal to -2.5 and secondary causes have been excluded. - Low bone mass (T-score between -1.0 and -2.5) and a 10-year probability of a hip fracture greater than or equal to 3% or a 10-year probability of a major osteoporosis-related fracture greater than or equal to 20% based on the [place] -adapted WHO algorithm. - Clinician judgment and/or patient preferences may indicate treatment for people with 10-year fracture probabilities above or below these levels 3. Patients with diagnosis of osteoporosis or at  high risk for fracture should have regular bone mineral density tests. For patients eligible for Medicare, routine testing is allowed once every 2 years. The testing frequency can be increased to one year for patients who have rapidly progressing disease, those who are receiving or discontinuing medical therapy to restore bone mass, or have additional risk factors. Electronically Signed   By: Dina  Arceo M.D.   On: 09/24/2024 18:06    Disposition: Discharge disposition: 01-Home or Self Care            Signed: Lonni CINDERELLA Poli 10/11/2024, 11:28 AM    "

## 2024-10-11 NOTE — Plan of Care (Signed)
   Problem: Activity: Goal: Risk for activity intolerance will decrease Outcome: Progressing   Problem: Nutrition: Goal: Adequate nutrition will be maintained Outcome: Completed/Met

## 2024-10-11 NOTE — Plan of Care (Signed)
" °  Problem: Education: Goal: Knowledge of General Education information will improve Description: Including pain rating scale, medication(s)/side effects and non-pharmacologic comfort measures Outcome: Adequate for Discharge   Problem: Health Behavior/Discharge Planning: Goal: Ability to manage health-related needs will improve Outcome: Adequate for Discharge   Problem: Clinical Measurements: Goal: Ability to maintain clinical measurements within normal limits will improve Outcome: Adequate for Discharge Goal: Will remain free from infection Outcome: Adequate for Discharge Goal: Diagnostic test results will improve Outcome: Adequate for Discharge Goal: Respiratory complications will improve Outcome: Adequate for Discharge Goal: Cardiovascular complication will be avoided Outcome: Adequate for Discharge   Problem: Activity: Goal: Risk for activity intolerance will decrease Outcome: Adequate for Discharge   Problem: Coping: Goal: Level of anxiety will decrease Outcome: Adequate for Discharge   Problem: Elimination: Goal: Will not experience complications related to bowel motility Outcome: Adequate for Discharge Goal: Will not experience complications related to urinary retention Outcome: Adequate for Discharge   Problem: Pain Managment: Goal: General experience of comfort will improve and/or be controlled Outcome: Adequate for Discharge   Problem: Safety: Goal: Ability to remain free from injury will improve Outcome: Adequate for Discharge   Problem: Skin Integrity: Goal: Risk for impaired skin integrity will decrease Outcome: Adequate for Discharge   Problem: Education: Goal: Knowledge of the prescribed therapeutic regimen will improve Outcome: Adequate for Discharge   Problem: Bowel/Gastric: Goal: Gastrointestinal status for postoperative course will improve Outcome: Adequate for Discharge   Problem: Cardiac: Goal: Ability to maintain an adequate cardiac  output Outcome: Adequate for Discharge Goal: Will show no evidence of cardiac arrhythmias Outcome: Adequate for Discharge   Problem: Nutritional: Goal: Will attain and maintain optimal nutritional status Outcome: Adequate for Discharge   Problem: Neurological: Goal: Will regain or maintain usual level of consciousness Outcome: Adequate for Discharge   Problem: Clinical Measurements: Goal: Ability to maintain clinical measurements within normal limits Outcome: Adequate for Discharge Goal: Postoperative complications will be avoided or minimized Outcome: Adequate for Discharge   Problem: Respiratory: Goal: Will regain and/or maintain adequate ventilation Outcome: Adequate for Discharge Goal: Respiratory status will improve Outcome: Adequate for Discharge   Problem: Skin Integrity: Goal: Demonstrates signs of wound healing without infection Outcome: Adequate for Discharge   Problem: Urinary Elimination: Goal: Will remain free from infection Outcome: Adequate for Discharge Goal: Ability to achieve and maintain adequate urine output Outcome: Adequate for Discharge   "

## 2024-10-11 NOTE — Progress Notes (Signed)
 Patient ID: Crystal Barnes, female   DOB: 1950/12/13, 74 y.o.   MRN: [id number] The patient has been up with therapy.  Her left hip is stable after the close reduction in the operating room last evening.  She knows to refrain from any physical activities right now at all with no working out.  She is someone who works on a regular basis and I believe she has overdone this.  She knows to not over flex or abduct her hip and to stay in the knee immobilizer but come in and out of it for hygiene purposes and to rest out of the knee immobilizer from time to time.  I will send in some hydrocodone  for her and she knows to get a follow-up appointment to see Dr. Jerri in about 2 weeks.

## 2024-10-11 NOTE — Evaluation (Signed)
 Physical Therapy Evaluation Patient Details Name: Crystal Barnes MRN: [id number] DOB: [birth year] Today's Date: 10/11/2024  History of Present Illness  Pt s/p dislocation of recent L THR and now s/p closed reduction under anaesthetic.  Pt wtih hx of L THR at [facility] on 08/18/24.  PMH - HTN, MI, CAD, stents, arthritis.  Clinical Impression  Pt admitted as above and presenting with ability to mobilize unassisted including negotiating stairs.  Pt educated and provided with written instruction on current precautions and at session end able to report all precautions without cues.  Pt eager for dc home this date.        If plan is discharge home, recommend the following: A little help with bathing/dressing/bathroom;Assist for transportation;Help with stairs or ramp for entrance   Can travel by private vehicle        Equipment Recommendations None recommended by PT  Recommendations for Other Services  OT consult    Functional Status Assessment Patient has had a recent decline in their functional status and demonstrates the ability to make significant improvements in function in a reasonable and predictable amount of time.     Precautions / Restrictions Precautions Precautions: Fall;Posterior Hip;Other (comment) Precaution Booklet Issued: Yes (comment) Precaution/Restrictions Comments: No active Abduction Restrictions Weight Bearing Restrictions Per Provider Order: No Other Position/Activity Restrictions: WBAT      Mobility  Bed Mobility               General bed mobility comments: Pt up in chair and requests back to same    Transfers Overall transfer level: Needs assistance Equipment used: Rolling walker (2 wheels) Transfers: Sit to/from Stand Sit to Stand: Supervision           General transfer comment: cues for follow through on new precautions    Ambulation/Gait Ambulation/Gait assistance: Modified independent (Device/Increase time) Gait Distance (Feet): 240  Feet Assistive device: Rolling walker (2 wheels), None Gait Pattern/deviations: Step-through pattern, Shuffle       General Gait Details: Good stability, min cues for adherence to new precautions  Stairs Stairs: Yes Stairs assistance: Contact guard assist Stair Management: One rail Left Number of Stairs: 4 General stair comments: min cues for sequence and cue to use step to technique initially  Wheelchair Mobility     Tilt Bed    Modified Rankin (Stroke Patients Only)       Balance Overall balance assessment: Mild deficits observed, not formally tested                                           Pertinent Vitals/Pain      Home Living Family/patient expects to be discharged to:: Private residence Living Arrangements: Alone Available Help at Discharge: Family;Available 24 hours/day Type of Home: House Home Access: Stairs to enter Entrance Stairs-Rails: Doctor, General Practice of Steps: 3   Home Layout: One level Home Equipment: Agricultural Consultant (2 wheels);Crutches;Toilet riser;Shower seat      Prior Function Prior Level of Function : Independent/Modified Independent;Driving             Mobility Comments: Sans AD       Extremity/Trunk Assessment   Upper Extremity Assessment Upper Extremity Assessment: Overall WFL for tasks assessed    Lower Extremity Assessment Lower Extremity Assessment: Overall WFL for tasks assessed (within limits of current restrictions)    Cervical / Trunk Assessment Cervical / Trunk Assessment: Normal  Communication   Communication Communication: No apparent difficulties    Cognition Arousal: Alert Behavior During Therapy: Impulsive   PT - Cognitive impairments: No apparent impairments                         Following commands: Intact       Cueing Cueing Techniques: Verbal cues     General Comments      Exercises     Assessment/Plan    PT Assessment Patient needs  continued PT services  PT Problem List Decreased knowledge of precautions;Decreased safety awareness;Decreased knowledge of use of DME;Decreased strength       PT Treatment Interventions DME instruction;Functional mobility training;Stair training;Gait training    PT Goals (Current goals can be found in the Care Plan section)  Acute Rehab PT Goals Patient Stated Goal: Not dislocate again PT Goal Formulation: All assessment and education complete, DC therapy    Frequency Min 1X/week     Co-evaluation               AM-PAC PT 6 Clicks Mobility  Outcome Measure Help needed turning from your back to your side while in a flat bed without using bedrails?: None Help needed moving from lying on your back to sitting on the side of a flat bed without using bedrails?: None Help needed moving to and from a bed to a chair (including a wheelchair)?: None Help needed standing up from a chair using your arms (e.g., wheelchair or bedside chair)?: A Little Help needed to walk in hospital room?: A Little Help needed climbing 3-5 steps with a railing? : A Little 6 Click Score: 21    End of Session Equipment Utilized During Treatment: Gait belt Activity Tolerance: Patient tolerated treatment well Patient left: in chair;with call bell/phone within reach;with chair alarm set Nurse Communication: Mobility status PT Visit Diagnosis: Difficulty in walking, not elsewhere classified (R26.2)    Time: 9076-9046 PT Time Calculation (min) (ACUTE ONLY): 30 min   Charges:   PT Evaluation $PT Eval Low Complexity: 1 Low PT Treatments $Therapeutic Activity: 8-22 mins PT General Charges $$ ACUTE PT VISIT: 1 Visit         [facility] PT Acute Rehabilitation Services Office [phone]   Kalyn Hofstra 10/11/2024, 4:19 PM

## 2024-10-13 ENCOUNTER — Encounter: Payer: Self-pay | Admitting: Physical Therapy

## 2024-10-13 ENCOUNTER — Other Ambulatory Visit (HOSPITAL_COMMUNITY): Payer: Self-pay

## 2024-10-13 ENCOUNTER — Other Ambulatory Visit: Payer: Self-pay | Admitting: Physician Assistant

## 2024-10-13 ENCOUNTER — Ambulatory Visit: Admitting: Physical Therapy

## 2024-10-13 ENCOUNTER — Encounter: Payer: Self-pay | Admitting: Internal Medicine

## 2024-10-13 MED ORDER — METHOCARBAMOL 500 MG PO TABS
500.0000 mg | ORAL_TABLET | Freq: Two times a day (BID) | ORAL | 2 refills | Status: DC | PRN
  Filled 2024-10-13: qty 20, 10d supply, fill #0
  Filled 2024-10-22: qty 20, 10d supply, fill #1
  Filled 2024-10-31: qty 20, 10d supply, fill #2

## 2024-10-14 ENCOUNTER — Encounter: Payer: Self-pay | Admitting: Internal Medicine

## 2024-10-14 ENCOUNTER — Telehealth: Payer: Self-pay | Admitting: Physician Assistant

## 2024-10-14 NOTE — Telephone Encounter (Signed)
 Pt wants to move forward with getting the approval for the Evenity  injection. Pt also wants to know if her insurance does not approve the injection are there any foundations that will help with the cost.

## 2024-10-15 ENCOUNTER — Other Ambulatory Visit: Payer: Self-pay | Admitting: Internal Medicine

## 2024-10-17 ENCOUNTER — Other Ambulatory Visit: Payer: Self-pay | Admitting: Physician Assistant

## 2024-10-17 ENCOUNTER — Telehealth: Payer: Self-pay | Admitting: Orthopaedic Surgery

## 2024-10-17 MED ORDER — HYDROCODONE-ACETAMINOPHEN 5-325 MG PO TABS: 1.0000 | ORAL_TABLET | Freq: Two times a day (BID) | ORAL | 0 refills | Status: DC | PRN

## 2024-10-17 NOTE — Telephone Encounter (Signed)
 Rx refill Hydrocodone    [facility]   Pt states out of meds in severe pain

## 2024-10-17 NOTE — Telephone Encounter (Signed)
 Sent, but frequency has changed

## 2024-10-17 NOTE — Telephone Encounter (Signed)
 yes

## 2024-10-20 ENCOUNTER — Ambulatory Visit: Admitting: Physical Therapy

## 2024-10-21 ENCOUNTER — Other Ambulatory Visit (INDEPENDENT_AMBULATORY_CARE_PROVIDER_SITE_OTHER): Payer: Self-pay

## 2024-10-21 ENCOUNTER — Ambulatory Visit: Admitting: Orthopaedic Surgery

## 2024-10-21 ENCOUNTER — Ambulatory Visit: Admitting: Physician Assistant

## 2024-10-21 DIAGNOSIS — Z96642 Presence of left artificial hip joint: Secondary | ICD-10-CM

## 2024-10-21 MED ORDER — HYDROCODONE-ACETAMINOPHEN 5-325 MG PO TABS: 1.0000 | ORAL_TABLET | Freq: Every day | ORAL | 0 refills | Status: DC | PRN

## 2024-10-21 NOTE — Telephone Encounter (Signed)
 BV requested.  Will call patient once received.

## 2024-10-21 NOTE — Progress Notes (Signed)
 "  Post-Op Visit Note   Patient: Crystal Barnes           Date of Birth: 01/26/1951           MRN: [id number] Visit Date: 10/21/2024 PCP: Geofm Glade PARAS, MD   Assessment & Plan:  Chief Complaint:  Chief Complaint  Patient presents with   Left Hip - Follow-up    10/10/24- Closed Reduction, Hip - Left     Visit Diagnoses:  1. Status post total replacement of left hip     Plan: History of Present Illness Crystal Barnes is a 74 year old female with left total hip arthroplasty who presents for follow-up of anterior dislocation of her left hip prosthesis and ongoing hip soreness.  She reports ongoing soreness localized to the left hip, worse at night.   The anterior dislocation occurred on January 2.  Dr. Vernetta performed a closed reduction on the Hana table.  She has been using a knee immobilizer mainly at night but cannot tolerate it during the day. She has remained mostly sedentary and stopped exercise for the past month per instructions. She completed physical therapy and has home exercises but is not in formal PT now. She can perform light activity such as throwing a ball for her dog and wants to resume swimming, walking in water, and walking her twelve-pound dog. She is worried that overactivity could impair healing.  Physical Exam MEASUREMENTS: Weight- 105. MUSCULOSKELETAL: Leg lengths equal.  Assessment and Plan Dislocation of left hip prosthesis Recovering from anterior dislocation of the left hip prosthesis. Managed non-operatively. - Avoid positions and movements that torque the hip for six weeks. - Take small steps when turning. - Discontinue weighted vest use. - Walk dog for up to fifteen minutes daily, avoiding overactivity. - Avoid swimming, aquatic exercise, or other exercise for six weeks. - Discontinue knee immobilizer. - Educated on importance of tissue healing to prevent revision arthroplasty.  Follow-Up Instructions: Return in about 6 weeks  (around 12/02/2024).   Orders:  Orders Placed This Encounter  Procedures   XR Pelvis 1-2 Views   No orders of the defined types were placed in this encounter.   Imaging: XR Pelvis 1-2 Views Result Date: 10/21/2024 Stable left total hip replacement without complications   PMFS History: Patient Active Problem List   Diagnosis Date Noted   Dislocation of prosthesis after total replacement of hip 10/10/2024   Dislocation of internal left hip prosthesis 10/10/2024   Age-related osteoporosis without current pathological fracture 10/06/2024   Paronychia of right little finger 08/21/2024   Status post total replacement of left hip 08/18/2024   Stage 2 skin ulcer of sacral region (HCC) 08/14/2024   Chronic hip pain, left 08/14/2024   Preop examination 06/03/2024   Primary osteoarthritis of left hip 05/13/2024   Mid back pain on right side 11/29/2023   Iron deficiency 01/07/2023   Hyperkalemia 05/15/2022   History of rectal polypectomy 06/06/2020   Serrated adenoma of colon 06/06/2020   Hx of adenomatous colonic polyps 06/06/2020   Myocardial infarction (HCC)    Arthralgia of both hands 06/26/2019   CAD (coronary artery disease) 02/10/2019   HTN (hypertension) 02/10/2019   Hypothyroidism (acquired) 02/10/2019   Hyperlipidemia, mixed 02/10/2019   Osteopenia 02/10/2019   RLS (restless legs syndrome) 02/10/2019   Chronic pain-pelvic floor dysfunction 02/10/2019   Past Medical History:  Diagnosis Date   Arthritis    CAD (coronary artery disease)    Cataract    bilateral-removed  Hyperlipidemia    Hypertension    Hypothyroidism    Myocardial infarction (HCC) 06/01/2013   NSTEMI, s/p DES mRCA, 60% mLAD treated medically   Restless leg syndrome    Thyroid  disease     Family History  Problem Relation Age of Onset   Colon polyps Mother    Esophageal cancer Father    Colon cancer Neg Hx    Rectal cancer Neg Hx    Stomach cancer Neg Hx    Inflammatory bowel disease Neg Hx     Liver disease Neg Hx    Pancreatic cancer Neg Hx     Past Surgical History:  Procedure Laterality Date   ABDOMINAL HYSTERECTOMY  2008   CATARACT EXTRACTION Bilateral    COLONOSCOPY     CORONARY STENT PLACEMENT  2014   ENDOSCOPIC MUCOSAL RESECTION N/A 11/24/2019   Procedure: ENDOSCOPIC MUCOSAL RESECTION;  Surgeon: Wilhelmenia Aloha Raddle., MD;  Location: [facility] ENDOSCOPY;  Service: Gastroenterology;  Laterality: N/A;   ENDOSCOPIC MUCOSAL RESECTION  08/04/2020   Procedure: ENDOSCOPIC MUCOSAL RESECTION;  Surgeon: Wilhelmenia Aloha Raddle., MD;  Location: [name];  Service: Gastroenterology;;   ENID SIGMOIDOSCOPY N/A 11/24/2019   Procedure: ENID MORIN;  Surgeon: Wilhelmenia Aloha Raddle., MD;  Location: [facility] ENDOSCOPY;  Service: Gastroenterology;  Laterality: N/A;   FLEXIBLE SIGMOIDOSCOPY N/A 08/04/2020   Procedure: FLEXIBLE SIGMOIDOSCOPY;  Surgeon: Wilhelmenia Aloha Raddle., MD;  Location: [name];  Service: Gastroenterology;  Laterality: N/A;   HEMOSTASIS CLIP PLACEMENT  11/24/2019   Procedure: HEMOSTASIS CLIP PLACEMENT;  Surgeon: Wilhelmenia Aloha Raddle., MD;  Location: [facility] ENDOSCOPY;  Service: Gastroenterology;;   HEMOSTASIS CLIP PLACEMENT  08/04/2020   Procedure: HEMOSTASIS CLIP PLACEMENT;  Surgeon: Wilhelmenia Aloha Raddle., MD;  Location: [name];  Service: Gastroenterology;;   HIP CLOSED REDUCTION Left 10/10/2024   Procedure: CLOSED REDUCTION, HIP;  Surgeon: Vernetta Lonni GRADE, MD;  Location: [facility] ORS;  Service: Orthopedics;  Laterality: Left;   LASIK Bilateral    POLYPECTOMY     SUBMUCOSAL LIFTING INJECTION  11/24/2019   Procedure: SUBMUCOSAL LIFTING INJECTION;  Surgeon: Wilhelmenia Aloha Raddle., MD;  Location: [facility] ENDOSCOPY;  Service: Gastroenterology;;   SUBMUCOSAL LIFTING INJECTION  08/04/2020   Procedure: SUBMUCOSAL LIFTING INJECTION;  Surgeon: Wilhelmenia Aloha Raddle., MD;  Location: [name];  Service: Gastroenterology;;   TONSILLECTOMY     TOTAL HIP  ARTHROPLASTY Left 08/18/2024   Procedure: ARTHROPLASTY, HIP, TOTAL, ANTERIOR APPROACH;  Surgeon: Jerri Kay HERO, MD;  Location: [facility] OR;  Service: Orthopedics;  Laterality: Left;  3-C   Social History   Occupational History   Occupation: Retired     Comment: about a year and half ago  Tobacco Use   Smoking status: Former    Current packs/day: 0.00    Types: Cigarettes    Quit date: 10/09/1992    Years since quitting: 32.0   Smokeless tobacco: Never  Vaping Use   Vaping status: Never Used  Substance and Sexual Activity   Alcohol use: Not Currently   Drug use: Never   Sexual activity: Not Currently    Birth control/protection: Post-menopausal     "

## 2024-10-22 ENCOUNTER — Other Ambulatory Visit (HOSPITAL_COMMUNITY): Payer: Self-pay

## 2024-10-23 ENCOUNTER — Telehealth: Payer: Self-pay | Admitting: Cardiovascular Disease

## 2024-10-23 NOTE — Telephone Encounter (Signed)
 Pt c/o medication issue:  1. Name of Medication:   Evolocumab  (REPATHA  SURECLICK) 140 MG/ML SOAJ    2. How are you currently taking this medication (dosage and times per day)?    3. Are you having a reaction (difficulty breathing--STAT)? no  4. What is your medication issue? Calling about getting the grant for medication. Please advise

## 2024-10-24 ENCOUNTER — Encounter: Payer: Self-pay | Admitting: Cardiovascular Disease

## 2024-10-24 ENCOUNTER — Telehealth: Payer: Self-pay | Admitting: Pharmacy Technician

## 2024-10-24 NOTE — Telephone Encounter (Signed)
 Patient grant still active. Will re-apply on 11/23/24

## 2024-10-24 NOTE — Telephone Encounter (Signed)
 Need to re-apply the hypercholesterolemia grant on 11/23/24   [employer] ID [id number] Patient Crystal Barnes Status Approved Sub Status Active Start Date 12/23/2023 End Date 12/21/2024 Assistance Type Co-pay Paid $882.70 Pending $0.00 [facility] Balance $8382.69 Pharmacy Card Card No. [id number] Card Status Active BIN [id number] PCN [id number] [employer] [id number] Help Desk [phone] Provider PDMI Processor PDMI Print approval letter

## 2024-10-27 ENCOUNTER — Ambulatory Visit

## 2024-10-28 ENCOUNTER — Other Ambulatory Visit: Payer: Self-pay | Admitting: Cardiovascular Disease

## 2024-10-28 DIAGNOSIS — I2510 Atherosclerotic heart disease of native coronary artery without angina pectoris: Secondary | ICD-10-CM

## 2024-10-28 DIAGNOSIS — E7800 Pure hypercholesterolemia, unspecified: Secondary | ICD-10-CM

## 2024-10-31 ENCOUNTER — Other Ambulatory Visit (HOSPITAL_COMMUNITY): Payer: Self-pay

## 2024-10-31 ENCOUNTER — Ambulatory Visit: Admitting: Physical Therapy

## 2024-10-31 ENCOUNTER — Encounter: Payer: Self-pay | Admitting: Internal Medicine

## 2024-10-31 ENCOUNTER — Other Ambulatory Visit: Payer: Self-pay | Admitting: Physician Assistant

## 2024-10-31 ENCOUNTER — Telehealth: Payer: Self-pay | Admitting: Orthopaedic Surgery

## 2024-10-31 ENCOUNTER — Other Ambulatory Visit: Payer: Self-pay | Admitting: Radiology

## 2024-10-31 ENCOUNTER — Encounter: Payer: Self-pay | Admitting: Cardiovascular Disease

## 2024-10-31 ENCOUNTER — Other Ambulatory Visit

## 2024-10-31 ENCOUNTER — Other Ambulatory Visit: Payer: Self-pay

## 2024-10-31 DIAGNOSIS — G2581 Restless legs syndrome: Secondary | ICD-10-CM | POA: Diagnosis not present

## 2024-10-31 DIAGNOSIS — M85852 Other specified disorders of bone density and structure, left thigh: Secondary | ICD-10-CM | POA: Diagnosis not present

## 2024-10-31 DIAGNOSIS — I10 Essential (primary) hypertension: Secondary | ICD-10-CM

## 2024-10-31 DIAGNOSIS — E039 Hypothyroidism, unspecified: Secondary | ICD-10-CM

## 2024-10-31 DIAGNOSIS — M85851 Other specified disorders of bone density and structure, right thigh: Secondary | ICD-10-CM

## 2024-10-31 DIAGNOSIS — E782 Mixed hyperlipidemia: Secondary | ICD-10-CM

## 2024-10-31 LAB — CBC WITH DIFFERENTIAL/PLATELET
Basophils Absolute: 0.1 K/uL (ref 0.0–0.1)
Basophils Relative: 1 % (ref 0.0–3.0)
Eosinophils Absolute: 0.5 K/uL (ref 0.0–0.7)
Eosinophils Relative: 7.3 % — ABNORMAL HIGH (ref 0.0–5.0)
HCT: 43.3 % (ref 36.0–46.0)
Hemoglobin: 14.8 g/dL (ref 12.0–15.0)
Lymphocytes Relative: 44 % (ref 12.0–46.0)
Lymphs Abs: 2.7 K/uL (ref 0.7–4.0)
MCHC: 34.2 g/dL (ref 30.0–36.0)
MCV: 88 fl (ref 78.0–100.0)
Monocytes Absolute: 0.5 K/uL (ref 0.1–1.0)
Monocytes Relative: 8.9 % (ref 3.0–12.0)
Neutro Abs: 2.4 K/uL (ref 1.4–7.7)
Neutrophils Relative %: 38.8 % — ABNORMAL LOW (ref 43.0–77.0)
Platelets: 347 K/uL (ref 150.0–400.0)
RBC: 4.92 Mil/uL (ref 3.87–5.11)
RDW: 12.9 % (ref 11.5–15.5)
WBC: 6.2 K/uL (ref 4.0–10.5)

## 2024-10-31 LAB — COMPREHENSIVE METABOLIC PANEL WITH GFR
ALT: 18 U/L (ref 3–35)
AST: 21 U/L (ref 5–37)
Albumin: 4.4 g/dL (ref 3.5–5.2)
Alkaline Phosphatase: 84 U/L (ref 39–117)
BUN: 23 mg/dL (ref 6–23)
CO2: 29 meq/L (ref 19–32)
Calcium: 10 mg/dL (ref 8.4–10.5)
Chloride: 95 meq/L — ABNORMAL LOW (ref 96–112)
Creatinine, Ser: 0.47 mg/dL (ref 0.40–1.20)
GFR: 95.89 mL/min
Glucose, Bld: 100 mg/dL — ABNORMAL HIGH (ref 70–99)
Potassium: 4.3 meq/L (ref 3.5–5.1)
Sodium: 130 meq/L — ABNORMAL LOW (ref 135–145)
Total Bilirubin: 0.4 mg/dL (ref 0.2–1.2)
Total Protein: 6.9 g/dL (ref 6.0–8.3)

## 2024-10-31 LAB — LIPID PANEL
Cholesterol: 137 mg/dL (ref 28–200)
HDL: 70.5 mg/dL
LDL Cholesterol: 47 mg/dL (ref 10–99)
NonHDL: 66.32
Total CHOL/HDL Ratio: 2
Triglycerides: 98 mg/dL (ref 10.0–149.0)
VLDL: 19.6 mg/dL (ref 0.0–40.0)

## 2024-10-31 LAB — TSH: TSH: 0.79 m[IU]/L (ref 0.40–4.50)

## 2024-10-31 LAB — VITAMIN D 25 HYDROXY (VIT D DEFICIENCY, FRACTURES): VITD: 85.75 ng/mL (ref 30.00–100.00)

## 2024-10-31 LAB — FERRITIN: Ferritin: 52.5 ng/mL (ref 10.0–291.0)

## 2024-10-31 MED ORDER — EVENITY 105 MG/1.17ML ~~LOC~~ SOSY: 210.0000 mg | PREFILLED_SYRINGE | Freq: Once | SUBCUTANEOUS | 0 refills | Status: AC

## 2024-10-31 NOTE — Telephone Encounter (Signed)
 Left vm

## 2024-10-31 NOTE — Telephone Encounter (Signed)
 Crystal Barnes called from [facility]. She would like patient's medication changed. Would like to speak with someone. [phone]

## 2024-11-01 ENCOUNTER — Other Ambulatory Visit (HOSPITAL_COMMUNITY): Payer: Self-pay

## 2024-11-01 ENCOUNTER — Ambulatory Visit: Payer: Self-pay | Admitting: Internal Medicine

## 2024-11-01 DIAGNOSIS — E871 Hypo-osmolality and hyponatremia: Secondary | ICD-10-CM

## 2024-11-03 ENCOUNTER — Ambulatory Visit: Admitting: Physical Therapy

## 2024-11-04 ENCOUNTER — Ambulatory Visit: Admitting: Physician Assistant

## 2024-11-04 ENCOUNTER — Ambulatory Visit: Admitting: Physical Therapy

## 2024-11-04 ENCOUNTER — Encounter: Payer: Self-pay | Admitting: Orthopaedic Surgery

## 2024-11-04 NOTE — Telephone Encounter (Signed)
 I told her to call the pharmacy and this was her response. I think she meant Xu.

## 2024-11-05 ENCOUNTER — Other Ambulatory Visit: Payer: Self-pay | Admitting: Physician Assistant

## 2024-11-05 ENCOUNTER — Other Ambulatory Visit: Payer: Self-pay | Admitting: Internal Medicine

## 2024-11-05 NOTE — Telephone Encounter (Signed)
 Being taken care of by Morna HERO.

## 2024-11-06 ENCOUNTER — Ambulatory Visit: Admitting: Physician Assistant

## 2024-11-06 ENCOUNTER — Telehealth: Payer: Self-pay | Admitting: Physician Assistant

## 2024-11-06 NOTE — Telephone Encounter (Signed)
 Sherrell from the [facility] called. She says the patient has a immunologist. She was prescribed evenity . Need to know that provider was aware and would like evenity  still prescribed. Her cb# [phone]

## 2024-11-09 ENCOUNTER — Encounter: Payer: Self-pay | Admitting: Internal Medicine

## 2024-11-11 MED ORDER — CLONAZEPAM 0.5 MG PO TABS: 0.5000 mg | ORAL_TABLET | Freq: Every day | ORAL | 3 refills | Status: AC

## 2024-11-12 ENCOUNTER — Other Ambulatory Visit: Payer: Self-pay | Admitting: Physician Assistant

## 2024-11-13 ENCOUNTER — Other Ambulatory Visit (HOSPITAL_COMMUNITY): Payer: Self-pay

## 2024-11-13 MED ORDER — METHOCARBAMOL 500 MG PO TABS
500.0000 mg | ORAL_TABLET | Freq: Two times a day (BID) | ORAL | 2 refills | Status: AC | PRN
  Filled 2024-11-13: qty 20, 10d supply, fill #0

## 2024-12-02 ENCOUNTER — Ambulatory Visit: Admitting: Orthopaedic Surgery

## 2025-01-16 ENCOUNTER — Ambulatory Visit: Admitting: Cardiovascular Disease

## 2025-02-27 ENCOUNTER — Ambulatory Visit: Admitting: Internal Medicine

## 2025-10-01 ENCOUNTER — Encounter: Admitting: Internal Medicine

## 2025-10-01 ENCOUNTER — Ambulatory Visit
